# Patient Record
Sex: Male | Born: 1950 | Race: White | Hispanic: No | Marital: Married | Smoking: Former smoker
Health system: Southern US, Community
[De-identification: ages and names within clinical notes are randomized; demographics above are authoritative.]

## PROBLEM LIST (undated history)

## (undated) DIAGNOSIS — I10 Essential (primary) hypertension: Secondary | ICD-10-CM

## (undated) DIAGNOSIS — J8410 Pulmonary fibrosis, unspecified: Secondary | ICD-10-CM

## (undated) DIAGNOSIS — K219 Gastro-esophageal reflux disease without esophagitis: Secondary | ICD-10-CM

## (undated) DIAGNOSIS — I2510 Atherosclerotic heart disease of native coronary artery without angina pectoris: Secondary | ICD-10-CM

## (undated) DIAGNOSIS — G4700 Insomnia, unspecified: Secondary | ICD-10-CM

## (undated) DIAGNOSIS — J449 Chronic obstructive pulmonary disease, unspecified: Secondary | ICD-10-CM

## (undated) HISTORY — PX: CATARACT EXTRACTION: SUR2

## (undated) HISTORY — PX: HERNIA REPAIR: SHX51

---

## 1998-05-09 ENCOUNTER — Ambulatory Visit (HOSPITAL_BASED_OUTPATIENT_CLINIC_OR_DEPARTMENT_OTHER): Admission: RE | Admit: 1998-05-09 | Discharge: 1998-05-09 | Payer: Self-pay | Admitting: Otolaryngology

## 2017-05-16 ENCOUNTER — Emergency Department (HOSPITAL_COMMUNITY): Payer: Medicare PPO

## 2017-05-16 ENCOUNTER — Other Ambulatory Visit: Payer: Self-pay

## 2017-05-16 ENCOUNTER — Inpatient Hospital Stay (HOSPITAL_COMMUNITY)
Admission: EM | Admit: 2017-05-16 | Discharge: 2017-05-23 | DRG: 196 | Disposition: A | Discharge status: Home Health | Payer: Medicare PPO | Attending: Family Medicine | Admitting: Family Medicine

## 2017-05-16 ENCOUNTER — Encounter (HOSPITAL_COMMUNITY): Payer: Self-pay | Admitting: Emergency Medicine

## 2017-05-16 DIAGNOSIS — R0602 Shortness of breath: Secondary | ICD-10-CM

## 2017-05-16 DIAGNOSIS — J9601 Acute respiratory failure with hypoxia: Secondary | ICD-10-CM

## 2017-05-16 DIAGNOSIS — Z803 Family history of malignant neoplasm of breast: Secondary | ICD-10-CM

## 2017-05-16 DIAGNOSIS — J069 Acute upper respiratory infection, unspecified: Secondary | ICD-10-CM | POA: Diagnosis present

## 2017-05-16 DIAGNOSIS — Z9981 Dependence on supplemental oxygen: Secondary | ICD-10-CM

## 2017-05-16 DIAGNOSIS — K219 Gastro-esophageal reflux disease without esophagitis: Secondary | ICD-10-CM | POA: Diagnosis present

## 2017-05-16 DIAGNOSIS — Z8249 Family history of ischemic heart disease and other diseases of the circulatory system: Secondary | ICD-10-CM

## 2017-05-16 DIAGNOSIS — J84112 Idiopathic pulmonary fibrosis: Principal | ICD-10-CM | POA: Diagnosis present

## 2017-05-16 DIAGNOSIS — R791 Abnormal coagulation profile: Secondary | ICD-10-CM | POA: Diagnosis present

## 2017-05-16 DIAGNOSIS — R0902 Hypoxemia: Secondary | ICD-10-CM | POA: Diagnosis not present

## 2017-05-16 DIAGNOSIS — B974 Respiratory syncytial virus as the cause of diseases classified elsewhere: Secondary | ICD-10-CM | POA: Diagnosis present

## 2017-05-16 DIAGNOSIS — J9621 Acute and chronic respiratory failure with hypoxia: Secondary | ICD-10-CM | POA: Diagnosis present

## 2017-05-16 DIAGNOSIS — Z87891 Personal history of nicotine dependence: Secondary | ICD-10-CM | POA: Diagnosis not present

## 2017-05-16 DIAGNOSIS — J849 Interstitial pulmonary disease, unspecified: Secondary | ICD-10-CM | POA: Diagnosis not present

## 2017-05-16 DIAGNOSIS — Z880 Allergy status to penicillin: Secondary | ICD-10-CM | POA: Diagnosis not present

## 2017-05-16 DIAGNOSIS — R739 Hyperglycemia, unspecified: Secondary | ICD-10-CM | POA: Diagnosis present

## 2017-05-16 DIAGNOSIS — Y92009 Unspecified place in unspecified non-institutional (private) residence as the place of occurrence of the external cause: Secondary | ICD-10-CM | POA: Diagnosis not present

## 2017-05-16 DIAGNOSIS — J449 Chronic obstructive pulmonary disease, unspecified: Secondary | ICD-10-CM

## 2017-05-16 DIAGNOSIS — Z800 Family history of malignant neoplasm of digestive organs: Secondary | ICD-10-CM

## 2017-05-16 DIAGNOSIS — R062 Wheezing: Secondary | ICD-10-CM | POA: Diagnosis not present

## 2017-05-16 DIAGNOSIS — I10 Essential (primary) hypertension: Secondary | ICD-10-CM

## 2017-05-16 DIAGNOSIS — E440 Moderate protein-calorie malnutrition: Secondary | ICD-10-CM

## 2017-05-16 DIAGNOSIS — J441 Chronic obstructive pulmonary disease with (acute) exacerbation: Secondary | ICD-10-CM | POA: Diagnosis not present

## 2017-05-16 DIAGNOSIS — F5104 Psychophysiologic insomnia: Secondary | ICD-10-CM | POA: Diagnosis present

## 2017-05-16 DIAGNOSIS — T380X5A Adverse effect of glucocorticoids and synthetic analogues, initial encounter: Secondary | ICD-10-CM | POA: Diagnosis present

## 2017-05-16 DIAGNOSIS — G4733 Obstructive sleep apnea (adult) (pediatric): Secondary | ICD-10-CM | POA: Diagnosis present

## 2017-05-16 DIAGNOSIS — E876 Hypokalemia: Secondary | ICD-10-CM | POA: Diagnosis not present

## 2017-05-16 HISTORY — DX: Insomnia, unspecified: G47.00

## 2017-05-16 HISTORY — DX: Chronic obstructive pulmonary disease, unspecified: J44.9

## 2017-05-16 HISTORY — DX: Essential (primary) hypertension: I10

## 2017-05-16 HISTORY — DX: Gastro-esophageal reflux disease without esophagitis: K21.9

## 2017-05-16 HISTORY — DX: Pulmonary fibrosis, unspecified: J84.10

## 2017-05-16 LAB — RESPIRATORY PANEL BY PCR
Adenovirus: NOT DETECTED
BORDETELLA PERTUSSIS-RVPCR: NOT DETECTED
CORONAVIRUS 229E-RVPPCR: NOT DETECTED
CORONAVIRUS OC43-RVPPCR: NOT DETECTED
Chlamydophila pneumoniae: NOT DETECTED
Coronavirus HKU1: NOT DETECTED
Coronavirus NL63: NOT DETECTED
INFLUENZA A-RVPPCR: NOT DETECTED
INFLUENZA B-RVPPCR: NOT DETECTED
METAPNEUMOVIRUS-RVPPCR: NOT DETECTED
MYCOPLASMA PNEUMONIAE-RVPPCR: NOT DETECTED
PARAINFLUENZA VIRUS 1-RVPPCR: NOT DETECTED
Parainfluenza Virus 2: NOT DETECTED
Parainfluenza Virus 3: NOT DETECTED
Parainfluenza Virus 4: NOT DETECTED
RESPIRATORY SYNCYTIAL VIRUS-RVPPCR: NOT DETECTED
Rhinovirus / Enterovirus: NOT DETECTED

## 2017-05-16 LAB — COMPREHENSIVE METABOLIC PANEL
ALK PHOS: 69 U/L (ref 38–126)
ALT: 16 U/L — AB (ref 17–63)
ANION GAP: 13 (ref 5–15)
AST: 21 U/L (ref 15–41)
Albumin: 3 g/dL — ABNORMAL LOW (ref 3.5–5.0)
BILIRUBIN TOTAL: 0.9 mg/dL (ref 0.3–1.2)
BUN: 10 mg/dL (ref 6–20)
CALCIUM: 8.7 mg/dL — AB (ref 8.9–10.3)
CO2: 25 mmol/L (ref 22–32)
CREATININE: 0.86 mg/dL (ref 0.61–1.24)
Chloride: 102 mmol/L (ref 101–111)
Glucose, Bld: 168 mg/dL — ABNORMAL HIGH (ref 65–99)
Potassium: 3.9 mmol/L (ref 3.5–5.1)
SODIUM: 140 mmol/L (ref 135–145)
TOTAL PROTEIN: 6.2 g/dL — AB (ref 6.5–8.1)

## 2017-05-16 LAB — BRAIN NATRIURETIC PEPTIDE: B NATRIURETIC PEPTIDE 5: 150.3 pg/mL — AB (ref 0.0–100.0)

## 2017-05-16 LAB — I-STAT TROPONIN, ED
TROPONIN I, POC: 0.03 ng/mL (ref 0.00–0.08)
Troponin i, poc: 0.02 ng/mL (ref 0.00–0.08)

## 2017-05-16 LAB — CBC WITH DIFFERENTIAL/PLATELET
BASOS PCT: 0 %
Basophils Absolute: 0 10*3/uL (ref 0.0–0.1)
EOS PCT: 2 %
Eosinophils Absolute: 0.3 10*3/uL (ref 0.0–0.7)
HEMATOCRIT: 38.2 % — AB (ref 39.0–52.0)
HEMOGLOBIN: 12.8 g/dL — AB (ref 13.0–17.0)
Lymphocytes Relative: 8 %
Lymphs Abs: 1.2 10*3/uL (ref 0.7–4.0)
MCH: 29.2 pg (ref 26.0–34.0)
MCHC: 33.5 g/dL (ref 30.0–36.0)
MCV: 87 fL (ref 78.0–100.0)
MONO ABS: 0.9 10*3/uL (ref 0.1–1.0)
MONOS PCT: 6 %
NEUTROS PCT: 84 %
Neutro Abs: 12.3 10*3/uL — ABNORMAL HIGH (ref 1.7–7.7)
Platelets: 177 10*3/uL (ref 150–400)
RBC: 4.39 MIL/uL (ref 4.22–5.81)
RDW: 13.8 % (ref 11.5–15.5)
WBC: 14.7 10*3/uL — AB (ref 4.0–10.5)

## 2017-05-16 LAB — HEMOGLOBIN A1C
HEMOGLOBIN A1C: 5.7 % — AB (ref 4.8–5.6)
Mean Plasma Glucose: 116.89 mg/dL

## 2017-05-16 LAB — D-DIMER, QUANTITATIVE: D-Dimer, Quant: 1.36 ug/mL-FEU — ABNORMAL HIGH (ref 0.00–0.50)

## 2017-05-16 MED ORDER — ACETAMINOPHEN 325 MG PO TABS
650.0000 mg | ORAL_TABLET | Freq: Four times a day (QID) | ORAL | Status: DC | PRN
  Administered 2017-05-20: 650 mg via ORAL
  Filled 2017-05-16: qty 2

## 2017-05-16 MED ORDER — IOPAMIDOL (ISOVUE-370) INJECTION 76%
INTRAVENOUS | Status: AC
  Administered 2017-05-16: 100 mL
  Filled 2017-05-16: qty 100

## 2017-05-16 MED ORDER — ENOXAPARIN SODIUM 40 MG/0.4ML ~~LOC~~ SOLN
40.0000 mg | SUBCUTANEOUS | Status: DC
  Administered 2017-05-16 – 2017-05-22 (×7): 40 mg via SUBCUTANEOUS
  Filled 2017-05-16 (×7): qty 0.4

## 2017-05-16 MED ORDER — DEXTROSE 5 % IV SOLN
2.0000 g | Freq: Once | INTRAVENOUS | Status: AC
  Administered 2017-05-16: 2 g via INTRAVENOUS
  Filled 2017-05-16: qty 2

## 2017-05-16 MED ORDER — GUAIFENESIN ER 600 MG PO TB12
600.0000 mg | ORAL_TABLET | Freq: Two times a day (BID) | ORAL | Status: DC
  Administered 2017-05-16 – 2017-05-23 (×14): 600 mg via ORAL
  Filled 2017-05-16 (×14): qty 1

## 2017-05-16 MED ORDER — ALBUTEROL SULFATE (2.5 MG/3ML) 0.083% IN NEBU: 2.5000 mg | INHALATION_SOLUTION | RESPIRATORY_TRACT | Status: DC | PRN

## 2017-05-16 MED ORDER — IPRATROPIUM-ALBUTEROL 0.5-2.5 (3) MG/3ML IN SOLN
3.0000 mL | RESPIRATORY_TRACT | Status: DC | PRN
  Filled 2017-05-16: qty 3

## 2017-05-16 MED ORDER — POLYETHYLENE GLYCOL 3350 17 G PO PACK
17.0000 g | PACK | Freq: Every day | ORAL | Status: DC | PRN
  Filled 2017-05-16: qty 1

## 2017-05-16 MED ORDER — METHYLPREDNISOLONE SODIUM SUCC 125 MG IJ SOLR
125.0000 mg | INTRAMUSCULAR | Status: DC
  Administered 2017-05-16: 125 mg via INTRAVENOUS
  Filled 2017-05-16: qty 2

## 2017-05-16 MED ORDER — ONDANSETRON HCL 4 MG/2ML IJ SOLN: 4.0000 mg | Freq: Four times a day (QID) | INTRAMUSCULAR | Status: DC | PRN

## 2017-05-16 MED ORDER — ACETAMINOPHEN 650 MG RE SUPP: 650.0000 mg | Freq: Four times a day (QID) | RECTAL | Status: DC | PRN

## 2017-05-16 MED ORDER — SODIUM CHLORIDE 0.9 % IV SOLN
INTRAVENOUS | Status: DC
  Administered 2017-05-16 – 2017-05-17 (×2): via INTRAVENOUS

## 2017-05-16 MED ORDER — TIOTROPIUM BROMIDE MONOHYDRATE 18 MCG IN CAPS
18.0000 ug | ORAL_CAPSULE | Freq: Every day | RESPIRATORY_TRACT | Status: DC
  Administered 2017-05-18 – 2017-05-23 (×6): 18 ug via RESPIRATORY_TRACT
  Filled 2017-05-16 (×2): qty 5

## 2017-05-16 MED ORDER — VANCOMYCIN HCL 10 G IV SOLR
1500.0000 mg | Freq: Once | INTRAVENOUS | Status: AC
  Administered 2017-05-16: 1500 mg via INTRAVENOUS
  Filled 2017-05-16: qty 1500

## 2017-05-16 MED ORDER — VANCOMYCIN HCL IN DEXTROSE 1-5 GM/200ML-% IV SOLN
1000.0000 mg | Freq: Two times a day (BID) | INTRAVENOUS | Status: DC
  Administered 2017-05-17: 1000 mg via INTRAVENOUS
  Filled 2017-05-16 (×2): qty 200

## 2017-05-16 MED ORDER — CEFEPIME HCL 1 G IJ SOLR
1.0000 g | Freq: Three times a day (TID) | INTRAMUSCULAR | Status: DC
  Administered 2017-05-17: 1 g via INTRAVENOUS
  Filled 2017-05-16 (×2): qty 1

## 2017-05-16 MED ORDER — IBUPROFEN 200 MG PO TABS
600.0000 mg | ORAL_TABLET | Freq: Four times a day (QID) | ORAL | Status: DC | PRN
Start: 2017-05-16 — End: 2017-05-23

## 2017-05-16 MED ORDER — ONDANSETRON HCL 4 MG PO TABS: 4.0000 mg | ORAL_TABLET | Freq: Four times a day (QID) | ORAL | Status: DC | PRN

## 2017-05-16 NOTE — ED Notes (Signed)
ED Provider and RT at bedside. 

## 2017-05-16 NOTE — ED Triage Notes (Signed)
Pt arrives from home via [facility] reporting incr SOB x 1 week, c/o "minimal" CP x 3 days.  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 Pt reports 2 hospitalizations in Jan at the [facility].  EMS reports giving 1 NTG. Pt AOx4, mentating well, NAD noted at this time.

## 2017-05-16 NOTE — Progress Notes (Signed)
Pt remains on NIV at this time. Once attempted to wean fiO2 on the Bipap machine patient immediately began to desat, pt tolerating it well

## 2017-05-16 NOTE — ED Provider Notes (Signed)
[facility] EMERGENCY DEPARTMENT Provider Note   CSN: [id number] Arrival date & time: 05/16/17  1231     History   Chief Complaint Chief Complaint  Patient presents with  . Shortness of Breath    HPI Jose Flores is a 67 y.o. male.  The history is provided by the patient, the spouse and medical records.  Shortness of Breath  This is a recurrent problem. The average episode lasts 3 days. The problem occurs continuously.The current episode started more than 2 days ago. The problem has been rapidly worsening. Associated symptoms include rhinorrhea, cough, sputum production and chest pain. Pertinent negatives include no fever, no headaches, no sore throat, no hemoptysis, no wheezing, no syncope, no vomiting, no abdominal pain and no leg swelling. He has tried nothing for the symptoms. He has had prior hospitalizations. Associated medical issues include COPD and chronic lung disease.  Chest Pain   This is a recurrent problem. The current episode started more than 2 days ago. The problem occurs constantly. The problem has been gradually improving. The pain is associated with coughing. The pain is present in the substernal region. The pain is moderate. The quality of the pain is described as sharp. The pain does not radiate. Associated symptoms include cough, shortness of breath and sputum production. Pertinent negatives include no abdominal pain, no back pain, no diaphoresis, no fever, no headaches, no hemoptysis, no syncope and no vomiting.    No past medical history on file.  There are no active problems to display for this patient.   History reviewed. No pertinent surgical history.     Home Medications    Prior to Admission medications   Not on File    Family History No family history on file.  Social History Social History   Tobacco Use  . Smoking status: Not on file  Substance Use Topics  . Alcohol use: Not on file  . Drug use: Not on file      Allergies   Patient has no allergy information on record.   Review of Systems Review of Systems  Constitutional: Positive for chills. Negative for diaphoresis, fatigue and fever.  HENT: Positive for congestion and rhinorrhea. Negative for sore throat.   Eyes: Negative for visual disturbance.  Respiratory: Positive for cough, sputum production, chest tightness and shortness of breath. Negative for hemoptysis, choking and wheezing.   Cardiovascular: Positive for chest pain. Negative for leg swelling and syncope.  Gastrointestinal: Negative for abdominal pain and vomiting.  Genitourinary: Negative for dysuria.  Musculoskeletal: Negative for back pain.  Neurological: Negative for light-headedness and headaches.  Psychiatric/Behavioral: Negative for agitation.  All other systems reviewed and are negative.    Physical Exam Updated Vital Signs SpO2 (!) 85%   Physical Exam  Constitutional: He is oriented to person, place, and time. He appears well-developed and well-nourished.  Non-toxic appearance. He does not appear ill. He appears distressed.  HENT:  Head: Normocephalic.  Nose: Rhinorrhea present.  Mouth/Throat: Oropharynx is clear and moist. No oropharyngeal exudate.  Eyes: Conjunctivae and EOM are normal. Pupils are equal, round, and reactive to light.  Neck: Normal range of motion.  Cardiovascular: Regular rhythm and intact distal pulses. Tachycardia present.  No murmur heard. Pulmonary/Chest: Accessory muscle usage present. Tachypnea noted. He is in respiratory distress. He has no wheezes. He has rhonchi. He has rales. He exhibits no tenderness.  Abdominal: Soft. He exhibits no distension. There is no tenderness.  Musculoskeletal: He exhibits no edema or tenderness.  Lymphadenopathy:    He has no cervical adenopathy.  Neurological: He is alert and oriented to person, place, and time. No sensory deficit. He exhibits normal muscle tone.  Skin: Capillary refill takes less  than 2 seconds. He is not diaphoretic. No erythema. No pallor.  Psychiatric: He has a normal mood and affect.  Nursing note and vitals reviewed.    ED Treatments / Results  Labs (all labs ordered are listed, but only abnormal results are displayed) Labs Reviewed  CBC WITH DIFFERENTIAL/PLATELET - Abnormal; Notable for the following components:      Result Value   WBC 14.7 (*)    Hemoglobin 12.8 (*)    HCT 38.2 (*)    Neutro Abs 12.3 (*)    All other components within normal limits  COMPREHENSIVE METABOLIC PANEL - Abnormal; Notable for the following components:   Glucose, Bld 168 (*)    Calcium 8.7 (*)    Total Protein 6.2 (*)    Albumin 3.0 (*)    ALT 16 (*)    All other components within normal limits  BRAIN NATRIURETIC PEPTIDE - Abnormal; Notable for the following components:   B Natriuretic Peptide 150.3 (*)    All other components within normal limits  D-DIMER, QUANTITATIVE (NOT AT [facility]) - Abnormal; Notable for the following components:   D-Dimer, Quant 1.36 (*)    All other components within normal limits  RESPIRATORY PANEL BY PCR  CULTURE, BLOOD (ROUTINE X 2)  CULTURE, BLOOD (ROUTINE X 2)  CULTURE, EXPECTORATED SPUTUM-ASSESSMENT  I-STAT TROPONIN, ED  I-STAT TROPONIN, ED    EKG  EKG Interpretation  Date/Time:  Monday May 16 2017 12:41:16 EST Ventricular Rate:  131 PR Interval:    QRS Duration: 82 QT Interval:  287 QTC Calculation: 424 R Axis:   129 Text Interpretation:  Sinus tachycardia Right axis deviation Borderline T wave abnormalities No prior ECG for comparison.  No STEMI Confirmed by Theda Belfastegeler, Chris ([id number]) on 05/16/2017 12:44:12 PM       Radiology Ct Angio Chest Pe W And/or Wo Contrast  Result Date: 05/16/2017 CLINICAL DATA:  Shortness of breath x1 week, chest pain x3 days. Positive D-dimer. History of pulmonary fibrosis and COPD. EXAM: CT ANGIOGRAPHY CHEST WITH CONTRAST TECHNIQUE: Multidetector CT imaging of the chest was performed using the  standard protocol during bolus administration of intravenous contrast. Multiplanar CT image reconstructions and MIPs were obtained to evaluate the vascular anatomy. CONTRAST:  100mL ISOVUE-370 IOPAMIDOL (ISOVUE-370) INJECTION 76% COMPARISON:  Chest radiographs dated 05/16/2017. FINDINGS: Cardiovascular: Satisfactory opacification of the bilateral pulmonary arteries to the segmental level. Evaluation of the bilateral lower lobe pulmonary arteries is mildly constrained by respiratory motion. No evidence of pulmonary embolism. No evidence of thoracic aortic aneurysm or dissection. Heart is normal in size.  No pericardial effusion. Mild atherosclerotic calcifications of the aortic arch. Mediastinum/Nodes: Mild thoracic lymphadenopathy, likely reactive, including --16 mm short axis high right paratracheal node (series 13/image 21) --17 mm short axis AP window node (series 13/image 30) --19 mm short axis subcarinal node (series 13/image 40) Visualized thyroid is unremarkable. Lungs/Pleura: Evaluation of the lung parenchyma is constrained by respiratory motion. Within that constraint, there are no suspicious pulmonary nodules. Multifocal pulmonary opacities/fibrosis with associated traction bronchiectasis and possible mild honeycombing. This appearance favors a UIP pattern. Superimposed mild centrilobular and paraseptal emphysematous changes, upper lobe predominant. No focal consolidation. No pleural effusion or pneumothorax. Upper Abdomen: Visualized upper abdomen is grossly unremarkable. Musculoskeletal: Mild degenerative changes of the visualized thoracolumbar spine.  Review of the MIP images confirms the above findings. IMPRESSION: No evidence of pulmonary embolism. Chronic interstitial lung disease, compatible with the patient's clinical history of pulmonary fibrosis. Superimposed mild emphysematous changes. Mild thoracic lymphadenopathy, likely reactive. Aortic Atherosclerosis (ICD10-I70.0) and Emphysema  (ICD10-J43.9). Electronically Signed   By: Charline Bills M.D.   On: 05/16/2017 15:30   Dg Chest Portable 1 View  Result Date: 05/16/2017 CLINICAL DATA:  Shortness of breath. EXAM: PORTABLE CHEST 1 VIEW COMPARISON:  None. FINDINGS: The heart size and mediastinal contours are within normal limits. Diffusely increased interstitial markings in the left greater than right lungs. Small right pleural effusion versus chronic scarring at the right costophrenic angle. No pneumothorax. No focal superimposed consolidation. No acute osseous abnormality. IMPRESSION: 1. Chronic interstitial lung disease without definite superimposed acute process. Electronically Signed   By: Obie Dredge M.D.   On: 05/16/2017 13:04    Procedures Procedures (including critical care time)  CRITICAL CARE Performed by: Canary Brim Fidencio Duddy Total critical care time: 40 minutes Critical care time was exclusive of separately billable procedures and treating other patients. Hypoxia in the 60-70% range needing Bipap  Critical care was necessary to treat or prevent imminent or life-threatening deterioration. Critical care was time spent personally by me on the following activities: development of treatment plan with patient and/or surrogate as well as nursing, discussions with consultants, evaluation of patient's response to treatment, examination of patient, obtaining history from patient or surrogate, ordering and performing treatments and interventions, ordering and review of laboratory studies, ordering and review of radiographic studies, pulse oximetry and re-evaluation of patient's condition.   Medications Ordered in ED Medications  methylPREDNISolone sodium succinate (SOLU-MEDROL) 125 mg/2 mL injection 125 mg (125 mg Intravenous Given 05/16/17 1944)  vancomycin (VANCOCIN) 1,500 mg in sodium chloride 0.9 % 500 mL IVPB (not administered)  ceFEPIme (MAXIPIME) 1 g in dextrose 5 % 50 mL IVPB (not administered)  vancomycin  (VANCOCIN) IVPB 1000 mg/200 mL premix (not administered)  iopamidol (ISOVUE-370) 76 % injection (100 mLs  Contrast Given 05/16/17 1511)  ceFEPIme (MAXIPIME) 2 g in dextrose 5 % 50 mL IVPB (2 g Intravenous New Bag/Given 05/16/17 1945)     Initial Impression / Assessment and Plan / ED Course  I have reviewed the triage vital signs and the nursing notes.  Pertinent labs & imaging results that were available during my care of the patient were reviewed by me and considered in my medical decision making (see chart for details).     Jose Flores is a 67 y.o. male with a past medical history significant for pulmonary fibrosis and COPD on 6 L of home oxygen at baseline presents with shortness of breath, chest pain, and hypoxia.  Patient reports that for the last 3 days he has had intermittent chest pains.  He reports that he is no longer having chest pain but is feeling very short of breath.  On EMS arrival, patient was having saturations in the 60s and 70% range.  During transport with CPAP, patient remained in the 70% range.  On arrival, patient was switched to BiPAP and had his oxygen saturations improved into the 90s on 100% FiO2.  Patient is feeling somewhat better based on shortness of breath.  Patient denies recent leg pain or leg swelling.  He denies recent nausea, vomiting, diaphoresis, or abdominal pain.  He denies any recent trauma.  He denies recent fevers or chills.  On exam, patient's lungs are coarse but have crackles in the bases  bilaterally.  Patient's legs were nonedematous.  Patient was tachycardic in the 130s on arrival.  EKG showed a sinus rhythm.  Patient reports his previous care was at the [place].  He reports being admitted twice for hypoxia in the last month.  Based on patient's complaint I am concerned about COPD exacerbation versus pulmonary embolism versus heart failure.  Patient reports no history of heart failure however with the crackles I am concerned about this.    Patient  will have diagnostic laboratory testing as well as imaging to find the etiology of his symptoms.  Given patient's hypoxia on his home regimen, patient will need admission.    At this time, I do not feel patient needs intubation as his oxygen saturations have improved however he did express to me that he would want to be intubated if needed.  Anticipate reassessment after workup.   D-dimer was elevated.  Patient will have CT PE study to rule out PE.  Patient's white blood cell count was slightly elevated.  Patient reports that he was recently diagnosed with RSV several weeks ago with cold symptoms.  He is continued to have cold symptoms but he says it feels slightly different than the RSV.  Flu test will be added.  Chest x-ray returned showing chronic interstitial lung disease without evidence of acute pneumonia.  Anticipate following up on CT scan prior to admission however patient will need admission due to BiPAP dependence at this time.  CT scan showed no evidence of pulmonary embolism.  There was pulmonary fibrosis present but no evidence of pneumonia.  Due to patient's profound hypoxia compared to baseline and now dependence on BiPAP, patient will require admission.  I suspect this is due to a viral infection worsening his chronic lung disease.  We did not find evidence of pneumonia.  Admitting team will be called for further management.    Final Clinical Impressions(s) / ED Diagnoses   Final diagnoses:  SOB (shortness of breath)  Hypoxia   \  Clinical Impression: 1. SOB (shortness of breath)   2. Hypoxia     Disposition: Admit  This note was prepared with assistance of Dragon voice recognition software. Occasional wrong-word or sound-a-like substitutions may have occurred due to the inherent limitations of voice recognition software.      Shloima Clinch, Canary Brim, MD 05/16/17 2023

## 2017-05-16 NOTE — H&P (Signed)
Family Medicine Teaching [facility] Admission History and Physical Service Pager: [phone]  Patient name: Jose Flores Medical record number: [id number] Date of birth: December 01, 1950 Age: 67 y.o. Gender: male  Primary Care Provider: [facility] Consultants: Pulmonology Code Status: Full  Chief Complaint: Shortness of Breath  Assessment and Plan: Jose Flores is a 67 y.o. male presenting with SOB after a recent viral illness due to RSV. PMH is significant for HTN, pulmonary fibrosis, COPD, GERD  Acute on Chronic Hypoxic Respiratory Failure 2/2 to Interstitial Lung Disease w/Viral URI: Acute requiring BiPAP. Patient likely has hypoxia with respiratory failure due to chronic interstitial lung disease that he has had for "many years" and has been chronically worsening. At baseline on 6L O2. He recently had 2 hospitalizations in January at [facility] for testing positive for RSV and SOB with difficulty breathing. Most likely his chronic interstitial lung disease was acutely worsened by viral RSV and he did not have the capacity to cope with infection. RVP negative. Bacterial super infection is possible given meets sepsis criteria with 3/4 SIRS (HR 130, RR 26, WBC 14.7) but less likely as he is afebrile. qSOFA 1. Unlikely to be PNA as CXR and CT chest showed no focal consolidation but poor baseline lung status could be masking HAP. Patient had elevated d-dimer but CT Angio was negative for Pulmonary Embolus . - Admit to inpatient Stepdown, attending Dr. Jennette Kettle - continue BiPap, attempt to wean as respiratory function improves - vitals per floor with oxygen saturation - NPO while on BiPap - IV Solumedrol 125mg  daily - Cont home Spiriva daily - Vanc and Cefepime (2/4-)  - Start Guaifenesin  - Obtain BCx x 2 - Obtain Sputum culture  GERD: Chronic. Patient endorses taking a medication daily for gerd but does not have his medication list on him at the moment and cannot remember  what he takes. [facility]  HTN: Chronic. Patient endorses taking a medication for his blood pressure but has no medication list and does not know what he takes for sure. - monitor BP  [facility]  Insomnia: Chronic. Patient reports taking Trazadone for sleep but does not know does and does not have his med list. - obtain med list from [place] - Hold Trazadone for now until we know his normal nighttime dose  FEN/GI: NPO, sips with meds, advance once off BiPaP Prophylaxis: Lovenox  Disposition: Stepdown  History of Present Illness:  Jose Flores is a 67 y.o. male presenting with shortness of breath that started on Saturday. He is normally on 6L O2 at home and was recently hospitalized twice due to difficulty breathing and was found to have viral URI due to RSV. At home he turned up oxygen to 10L which only helped a little with continued difficulty breathing at rest. After no improvement with increased oxygen requirement he came to the [facility] ED today.  He had associated non-radiating, chest pain, central, intermittent, mild. None currently. Went away with couple of aleve.  He denies syncope, dizziness, headache, Coughing a little bit, but clear productive sputum.He has been taking cough medicine and endorses lots of nasal congestion without fever/chills.  Couldn't hear since last hospitalization 2 weeks.  Review Of Systems: Per HPI with the following additions:   Review of Systems  Constitutional: Positive for weight loss. Negative for chills and fever.  HENT: Positive for congestion, hearing loss and sinus pain. Negative for ear  discharge, ear pain and sore throat.   Respiratory: Positive for cough, shortness of breath and wheezing. Negative for hemoptysis.   Cardiovascular: Positive for chest pain. Negative for palpitations.  Gastrointestinal: Positive for heartburn. Negative for abdominal pain, constipation, diarrhea, nausea and vomiting.   Genitourinary: Negative for dysuria, frequency and urgency.  Neurological: Negative for dizziness.    Patient Active Problem List   Diagnosis Date Noted  . Hypoxia 05/16/2017    Past Medical History: Past Medical History:  Diagnosis Date  . Acid reflux   . COPD (chronic obstructive pulmonary disease) (HCC)   . Hypertension   . Insomnia   . Pulmonary fibrosis (HCC)     Past Surgical History: History reviewed. No pertinent surgical history.  Social History: Social History   Tobacco Use  . Smoking status: Former Games developermoker  . Smokeless tobacco: Never Used  Substance Use Topics  . Alcohol use: Yes    Comment: occ  . 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 Alcohol 3-4 drinks a night, last drink was way before Christmas. No recreational drugs.  Please also refer to relevant sections of EMR.  Family History: Family History  Problem Relation Age of Onset  . Colon cancer Mother   . Heart failure Father   . Breast cancer Sister      Allergies and Medications: Allergies  Allergen Reactions  . Penicillins     Unknown reaction, was hospitalized for this.   No current facility-administered medications on file prior to encounter.    Current Outpatient Medications on File Prior to Encounter  Medication Sig Dispense Refill  . tiotropium (SPIRIVA) 18 MCG inhalation capsule Place 18 mcg into inhaler and inhale daily.    Trazodone nightly Recently halved blood pressure medication because SBP WAS 110 as outpatient  Takes acid reflux Nebulizer at home Cough medicine chronically   Objective: BP 115/76   Pulse (!) 109   Temp 97.7 F (36.5 C) (Axillary)   Resp (!) 26   Ht 5\' 8"  (1.727 m)   Wt 166 lb (75.3 kg)   SpO2 97%   BMI 25.24 kg/m  Exam: General: Alert and Oriented x 3, NAD Eyes: PERRLA, EOMI ENTM: MMM. Dull TM on L, nonerythematous. TM on R pearly with good light reflex. Maxillary tenderness on  palpation. Cardiovascular: Tachycardia with regular rhythm, no murmurs, no gallops Respiratory: increased work of breathing with accessory muscle use, decreased breath sounds bilaterally, no wheezing, no crackles. Able to talk in short sentences, bipap in place. Gastrointestinal: non-distended, non-tender, soft. MSK: FROM of all extermities Derm: warm, dry, intact, no rashes Neuro: CN II-XII grossly intact, no gross deficits Psych: normal mood and affect  Labs and Imaging: CBC BMET  Recent Labs  Lab 05/16/17 1255  WBC 14.7*  HGB 12.8*  HCT 38.2*  PLT 177   Recent Labs  Lab 05/16/17 1255  NA 140  K 3.9  CL 102  CO2 25  BUN 10  CREATININE 0.86  GLUCOSE 168*  CALCIUM 8.7*     2/4 - D-dimer: 1.36 2/4 - BNP: 150.3 2/4 - Resp. Panel: not detected 2/4 - BCx: pending 2/4 - HgbA1c: pending 2/4 - Troponin I-stat x 2: 0.03 -> 0.02  Ct Angio Chest Pe W And/or Wo Contrast  Result Date: 05/16/2017 CLINICAL DATA:  Shortness of breath x1 week, chest pain x3 days. Positive D-dimer. History of pulmonary fibrosis and COPD. EXAM: CT ANGIOGRAPHY CHEST WITH CONTRAST TECHNIQUE: Multidetector  CT imaging of the chest was performed using the standard protocol during bolus administration of intravenous contrast. Multiplanar CT image reconstructions and MIPs were obtained to evaluate the vascular anatomy. CONTRAST:  ISOVUE-370 IOPAMIDOL (ISOVUE-370) INJECTION 76% COMPARISON:  Chest radiographs dated 05/16/2017. FINDINGS: Cardiovascular: Satisfactory opacification of the bilateral pulmonary arteries to the segmental level. Evaluation of the bilateral lower lobe pulmonary arteries is mildly constrained by respiratory motion. No evidence of pulmonary embolism. No evidence of thoracic aortic aneurysm or dissection. Heart is normal in size.  No pericardial effusion. Mild atherosclerotic calcifications of the aortic arch. Mediastinum/Nodes: Mild thoracic lymphadenopathy, likely reactive, including --16 mm  short axis high right paratracheal node (series 13/image 21) --17 mm short axis AP window node (series 13/image 30) --19 mm short axis subcarinal node (series 13/image 40) Visualized thyroid is unremarkable. Lungs/Pleura: Evaluation of the lung parenchyma is constrained by respiratory motion. Within that constraint, there are no suspicious pulmonary nodules. Multifocal pulmonary opacities/fibrosis with associated traction bronchiectasis and possible mild honeycombing. This appearance favors a UIP pattern. Superimposed mild centrilobular and paraseptal emphysematous changes, upper lobe predominant. No focal consolidation. No pleural effusion or pneumothorax. Upper Abdomen: Visualized upper abdomen is grossly unremarkable. Musculoskeletal: Mild degenerative changes of the visualized thoracolumbar spine. Review of the MIP images confirms the above findings. IMPRESSION: No evidence of pulmonary embolism. Chronic interstitial lung disease, compatible with the patient's clinical history of pulmonary fibrosis. Superimposed mild emphysematous changes. Mild thoracic lymphadenopathy, likely reactive. Aortic Atherosclerosis (ICD10-I70.0) and Emphysema (ICD10-J43.9). Electronically Signed   By: Charline Bills M.D.   On: 05/16/2017 15:30   Dg Chest Portable 1 View  Result Date: 05/16/2017 CLINICAL DATA:  Shortness of breath. EXAM: PORTABLE CHEST 1 VIEW COMPARISON:  None. FINDINGS: The heart size and mediastinal contours are within normal limits. Diffusely increased interstitial markings in the left greater than right lungs. Small right pleural effusion versus chronic scarring at the right costophrenic angle. No pneumothorax. No focal superimposed consolidation. No acute osseous abnormality. IMPRESSION: 1. Chronic interstitial lung disease without definite superimposed acute process. Electronically Signed   By: Obie Dredge M.D.   On: 05/16/2017 13:04    Arlyce Harman, DO 05/16/2017, 4:27 PM PGY-1, [place]  Family Medicine FPTS Intern pager: [phone], text pages welcome  FPTS Upper-Level Resident Addendum  I have independently interviewed and examined the patient. I have discussed the above with the original author and agree with their documentation. My edits for correction/addition/clarification are in blue. Please see also any attending notes.   Leland Her, DO PGY-2, [facility] 05/16/2017 8:21 PM  FPTS Service pager: [phone] (text pages welcome through [facility])

## 2017-05-16 NOTE — Progress Notes (Signed)
FPTS Interim Progress Note  Evaluated patient who is resting comfortably on BiPAP with sats 97% with good waveform.  BP 120/84   Pulse 99   Temp 97.7 F (36.5 C) (Axillary)   Resp (!) 26   Ht 5\' 8"  (1.727 m)   Wt 166 lb (75.3 kg)   SpO2 97%   BMI 25.24 kg/m    A/P: Continue current management.  Ellwood DenseRumball, Spiro Ausborn, DO 05/16/2017, 10:40 PM PGY-1, [facility] Service pager [phone]

## 2017-05-16 NOTE — Progress Notes (Signed)
Pharmacy Antibiotic Note  Jose Flores is a 67 y.o. male admitted on 05/16/2017 with sepsis.  Pharmacy has been consulted for cefepime and vancomycin dosing.  SCr stable, CrCl ~5680ml/min  Plan: Give cefepime 2g IV x 1, then start cefepime 1g IV Q8h Give vancomycin 1.5g IV x 1, then start vancomycin 1g IV Q12h Monitor clinical picture, renal function, VT prn F/U C&S, abx deescalation / LOT   Height: 5\' 8"  (172.7 cm) Weight: 166 lb (75.3 kg) IBW/kg (Calculated) : 68.4  Temp (24hrs), Avg:97.7 F (36.5 C), Min:97.7 F (36.5 C), Max:97.7 F (36.5 C)  Recent Labs  Lab 05/16/17 1255  WBC 14.7*  CREATININE 0.86    Estimated Creatinine Clearance: 81.7 mL/min (by C-G formula based on SCr of 0.86 mg/dL).    Allergies  Allergen Reactions  . Penicillins     Unknown reaction, was hospitalized for this.     Thank you for allowing pharmacy to be a part of this patient's care.  Armandina StammerBATCHELDER,Sharilynn Cassity J 05/16/2017 6:26 PM

## 2017-05-17 DIAGNOSIS — J449 Chronic obstructive pulmonary disease, unspecified: Secondary | ICD-10-CM

## 2017-05-17 DIAGNOSIS — J849 Interstitial pulmonary disease, unspecified: Secondary | ICD-10-CM

## 2017-05-17 DIAGNOSIS — R062 Wheezing: Secondary | ICD-10-CM

## 2017-05-17 DIAGNOSIS — J9601 Acute respiratory failure with hypoxia: Secondary | ICD-10-CM

## 2017-05-17 DIAGNOSIS — R0902 Hypoxemia: Secondary | ICD-10-CM

## 2017-05-17 LAB — BASIC METABOLIC PANEL
Anion gap: 10 (ref 5–15)
BUN: 17 mg/dL (ref 6–20)
CALCIUM: 8.8 mg/dL — AB (ref 8.9–10.3)
CO2: 24 mmol/L (ref 22–32)
Chloride: 106 mmol/L (ref 101–111)
Creatinine, Ser: 0.85 mg/dL (ref 0.61–1.24)
GFR calc Af Amer: >60 mL/min (ref >60)
GFR calc non Af Amer: >60 mL/min (ref >60)
GLUCOSE: 169 mg/dL — AB (ref 65–99)
Potassium: 4.7 mmol/L (ref 3.5–5.1)
Sodium: 140 mmol/L (ref 135–145)

## 2017-05-17 LAB — MRSA PCR SCREENING: MRSA BY PCR: NEGATIVE

## 2017-05-17 LAB — CBC
HCT: 35.6 % — ABNORMAL LOW (ref 39.0–52.0)
Hemoglobin: 11.5 g/dL — ABNORMAL LOW (ref 13.0–17.0)
MCH: 28.5 pg (ref 26.0–34.0)
MCHC: 32.3 g/dL (ref 30.0–36.0)
MCV: 88.3 fL (ref 78.0–100.0)
PLATELETS: 158 10*3/uL (ref 150–400)
RBC: 4.03 MIL/uL — ABNORMAL LOW (ref 4.22–5.81)
RDW: 14 % (ref 11.5–15.5)
WBC: 8.5 10*3/uL (ref 4.0–10.5)

## 2017-05-17 LAB — PROCALCITONIN: PROCALCITONIN: 0.16 ng/mL

## 2017-05-17 MED ORDER — POLYVINYL ALCOHOL 1.4 % OP SOLN
1.0000 [drp] | OPHTHALMIC | Status: DC | PRN
  Administered 2017-05-17: 1 [drp] via OPHTHALMIC
  Filled 2017-05-17: qty 15

## 2017-05-17 MED ORDER — ENSURE ENLIVE PO LIQD
237.0000 mL | Freq: Three times a day (TID) | ORAL | Status: DC
  Administered 2017-05-17 – 2017-05-23 (×17): 237 mL via ORAL

## 2017-05-17 MED ORDER — DOXYCYCLINE HYCLATE 100 MG PO TABS
100.0000 mg | ORAL_TABLET | Freq: Two times a day (BID) | ORAL | Status: DC
  Administered 2017-05-17 – 2017-05-18 (×3): 100 mg via ORAL
  Filled 2017-05-17 (×3): qty 1

## 2017-05-17 MED ORDER — PREDNISONE 50 MG PO TABS
50.0000 mg | ORAL_TABLET | Freq: Every day | ORAL | Status: DC
  Administered 2017-05-17 – 2017-05-18 (×2): 50 mg via ORAL
  Filled 2017-05-17 (×2): qty 1

## 2017-05-17 MED ORDER — ORAL CARE MOUTH RINSE
15.0000 mL | Freq: Two times a day (BID) | OROMUCOSAL | Status: DC
  Administered 2017-05-18: 15 mL via OROMUCOSAL

## 2017-05-17 MED ORDER — CHLORHEXIDINE GLUCONATE 0.12 % MT SOLN
15.0000 mL | Freq: Two times a day (BID) | OROMUCOSAL | Status: DC
  Administered 2017-05-17 – 2017-05-23 (×13): 15 mL via OROMUCOSAL
  Filled 2017-05-17 (×12): qty 15

## 2017-05-17 MED ORDER — PREDNISONE 10 MG PO TABS: 10.0000 mg | ORAL_TABLET | Freq: Every day | ORAL | Status: DC

## 2017-05-17 MED ORDER — IPRATROPIUM-ALBUTEROL 0.5-2.5 (3) MG/3ML IN SOLN
3.0000 mL | Freq: Two times a day (BID) | RESPIRATORY_TRACT | Status: DC
  Administered 2017-05-17 – 2017-05-23 (×11): 3 mL via RESPIRATORY_TRACT
  Filled 2017-05-17 (×12): qty 3

## 2017-05-17 MED ORDER — ORAL CARE MOUTH RINSE
15.0000 mL | Freq: Two times a day (BID) | OROMUCOSAL | Status: DC
  Administered 2017-05-17 – 2017-05-19 (×5): 15 mL via OROMUCOSAL

## 2017-05-17 NOTE — Progress Notes (Signed)
Visited with this patient and his spouse regarding the Advanced Directive.  Patient states that they have [facility]  Asked patient to please get a copy of it and bring it here so we can add it to his chart and medical record.  Patient's spouse stated she would get it and get it to us.      05/17/17 1032  Clinical Encounter Type  Visited With Patient and family together  Visit Type Initial;Spiritual support

## 2017-05-17 NOTE — Consult Note (Signed)
Name: Jose Flores MRN: [id number] DOB: May 13, 1950    ADMISSION DATE:  05/16/2017 CONSULTATION DATE:  05/17/2017  REFERRING MD :  [facility]  CHIEF COMPLAINT:  COPD/Pulmonary Fibrosis/ BIPAP  BRIEF PATIENT DESCRIPTION:  Adult male supine in bed wearing venti mask, speaking in full sentences with saturation of 100%. Some respiratory accessory muscle use noted. Alert and appropriate. Able to give good history.  SIGNIFICANT EVENTS  04/2017>> 2 admissions [facility] with Acute on chronic respiratory failure  + RSV 2/4>> Admission with A on C Respiratory Failure, RSV negative  STUDIES:  CTA 2/4>>: No evidence of pulmonary embolism. Chronic interstitial lung disease, compatible with the patient's clinical history of pulmonary fibrosis. Superimposed mild emphysematous changes. Mild thoracic lymphadenopathy, likely reactive.  CXR 2/4>> Chronic interstitial lung disease without definite superimposed acute process.  HISTORY OF PRESENT ILLNESS:   Jose Flores is a 67 y.o. male presenting with shortness of breath that started on 2/2. He is normally on 6L O2  at home  due to chronic ILD and was recently hospitalized twice 04/2017  due to difficulty breathing and was found to have viral URI due to RSV. At home he turned up oxygen to 10L which only helped a little with continued difficulty breathing at rest. After no improvement with increased oxygen requirement he came to the [facility] ED 2/2. Pt had 3/4 SIRS criteria, but no fever.CT chest showed no focal consolidation but poor baseline lung status could be masking HAP. Patient had elevated d-dimer but CT Angio was negative for PE. BNP was 150.3  PCCM has been asked to consult and assist with care.  Pt. States home oxygen demand prior to [place] admissions in 04/2017 was 3 L at rest and 8 L with exertion. Since discharge from [place]  baseline has been 6 L at rest and max with exertion  Of note, patient acutely lost his hearing  after recent admission to the [place] in January 2019   PAST MEDICAL HISTORY :   has a past medical history of Acid reflux, COPD (chronic obstructive pulmonary disease) (HCC), Hypertension, Insomnia, and Pulmonary fibrosis (HCC).  has no past surgical history on file. Prior to Admission medications   Medication Sig Start Date End Date Taking? Authorizing Provider  tiotropium (SPIRIVA) 18 MCG inhalation capsule Place 18 mcg into inhaler and inhale daily.   Yes [provider]   Allergies  Allergen Reactions  . Penicillins     Unknown reaction, was hospitalized for this.    FAMILY HISTORY:  family history includes Breast cancer in his sister; Colon cancer in his mother; Heart failure in his father. SOCIAL HISTORY:  reports that he has quit smoking. he has never used smokeless tobacco. He reports that he drinks alcohol. He reports that he does not use drugs.  REVIEW OF SYSTEMS:   Constitutional: Negative for fever, chills, weight loss, +malaise/fatigue and no diaphoresis.  HENT: +  for hearing loss, ear pain, nosebleeds, congestion, sore throat, neck pain, tinnitus and ear discharge.   Eyes: Negative for blurred vision, double vision, photophobia, pain, discharge and redness.  Respiratory: + for intermittent  Cough, No  hemoptysis, + yellow sputum production, + shortness of breath, No wheezing or stridor.   Cardiovascular: + for chest pain relieved with Aleve, palpitations, No orthopnea, claudication, + leg swelling and PND.  Gastrointestinal: Negative for heartburn, nausea, vomiting, abdominal pain, diarrhea, constipation, blood in stool and melena.  Genitourinary: Negative for dysuria, urgency, frequency, hematuria and  flank pain.  Musculoskeletal: Negative for myalgias, back pain, joint pain and falls.  Skin: Negative for itching and rash.  Neurological: Negative for dizziness, tingling, tremors, sensory change, speech change, focal weakness, seizures, loss of consciousness,  weakness and headaches.  Endo/Heme/Allergies: Negative for environmental allergies and polydipsia. Does not bruise/bleed easily.  SUBJECTIVE: Awake and alert supine in bed wearing  Venti Mask. States he feels better than yesterday.  VITAL SIGNS: Temp:  [97.6 F (36.4 C)-98.3 F (36.8 C)] 97.6 F (36.4 C) (02/05 0401) Pulse Rate:  [90-132] 93 (02/05 0716) Resp:  [18-30] 23 (02/05 0716) BP: (115-147)/(75-91) 134/81 (02/05 0716) SpO2:  [75 %-100 %] 100 % (02/05 0716) FiO2 (%):  [50 %-100 %] 50 % (02/05 0816) Weight:  [166 lb (75.3 kg)] 166 lb (75.3 kg) (02/04 1245)  PHYSICAL EXAMINATION: General: Awake alert and appropriate  Neuro:  MAE x 4, A&O x 3, Appropriate, conversational HEENT:  NCAT, no JVD, + congestion, +  hearing loss, + sinus tenderness Cardiovascular: , S1, S2, No RMG, SR per telemetry Lungs: Crackles throughout, no wheezing,  + accessory muscle use, BS decreased per bases  Abdomen: BS +, non-distended, non-tender, obese Musculoskeletal: FROM al extremities Skin: Warm, dry, intact , no rash or lesions noted  Recent Labs  Lab 05/16/17 1255 05/17/17 0328  NA 140 140  K 3.9 4.7  CL 102 106  CO2 25 24  BUN 10 17  CREATININE 0.86 0.85  GLUCOSE 168* 169*   Recent Labs  Lab 05/16/17 1255 05/17/17 0328  HGB 12.8* 11.5*  HCT 38.2* 35.6*  WBC 14.7* 8.5  PLT 177 158   Ct Angio Chest Pe W And/or Wo Contrast  Result Date: 05/16/2017 CLINICAL DATA:  Shortness of breath x1 week, chest pain x3 days. Positive D-dimer. History of pulmonary fibrosis and COPD. EXAM: CT ANGIOGRAPHY CHEST WITH CONTRAST TECHNIQUE: Multidetector CT imaging of the chest was performed using the standard protocol during bolus administration of intravenous contrast. Multiplanar CT image reconstructions and MIPs were obtained to evaluate the vascular anatomy. CONTRAST:  ISOVUE-370 IOPAMIDOL (ISOVUE-370) INJECTION 76% COMPARISON:  Chest radiographs dated 05/16/2017. FINDINGS: Cardiovascular:  Satisfactory opacification of the bilateral pulmonary arteries to the segmental level. Evaluation of the bilateral lower lobe pulmonary arteries is mildly constrained by respiratory motion. No evidence of pulmonary embolism. No evidence of thoracic aortic aneurysm or dissection. Heart is normal in size.  No pericardial effusion. Mild atherosclerotic calcifications of the aortic arch. Mediastinum/Nodes: Mild thoracic lymphadenopathy, likely reactive, including --16 mm short axis high right paratracheal node (series 13/image 21) --17 mm short axis AP window node (series 13/image 30) --19 mm short axis subcarinal node (series 13/image 40) Visualized thyroid is unremarkable. Lungs/Pleura: Evaluation of the lung parenchyma is constrained by respiratory motion. Within that constraint, there are no suspicious pulmonary nodules. Multifocal pulmonary opacities/fibrosis with associated traction bronchiectasis and possible mild honeycombing. This appearance favors a UIP pattern. Superimposed mild centrilobular and paraseptal emphysematous changes, upper lobe predominant. No focal consolidation. No pleural effusion or pneumothorax. Upper Abdomen: Visualized upper abdomen is grossly unremarkable. Musculoskeletal: Mild degenerative changes of the visualized thoracolumbar spine. Review of the MIP images confirms the above findings. IMPRESSION: No evidence of pulmonary embolism. Chronic interstitial lung disease, compatible with the patient's clinical history of pulmonary fibrosis. Superimposed mild emphysematous changes. Mild thoracic lymphadenopathy, likely reactive. Aortic Atherosclerosis (ICD10-I70.0) and Emphysema (ICD10-J43.9). Electronically Signed   By: Charline Bills M.D.   On: 05/16/2017 15:30   Dg Chest Portable 1 View  Result  Date: 05/16/2017 CLINICAL DATA:  Shortness of breath. EXAM: PORTABLE CHEST 1 VIEW COMPARISON:  None. FINDINGS: The heart size and mediastinal contours are within normal limits. Diffusely  increased interstitial markings in the left greater than right lungs. Small right pleural effusion versus chronic scarring at the right costophrenic angle. No pneumothorax. No focal superimposed consolidation. No acute osseous abnormality. IMPRESSION: 1. Chronic interstitial lung disease without definite superimposed acute process. Electronically Signed   By: Obie DredgeWilliam T Derry M.D.   On: 05/16/2017 13:04    Antibiotics:  Vanc 2/4>> Cefepime 2/4>>  Cultures Blood 2/4 RVP>> Negative  ASSESSMENT / PLAN: A: - Acute on Chronic Respiratory Failure 2/2 ILD with recent Viral URI ( RSV +)  January 2019  [place]qu[place]  [place] - RSV negative 2/4 - COPD>> Home maintenance Spiriva and Symbicort, Albuterol nebs - Baseline oxygen demand 6L [place] - D-Dimer elevated but CTA negative for PE - Requiring BiPAP on admission - ? Bacterial Super infection as 3/4 SIRS Criteria ( HR 130, RR 26, WBC 14.7), but   Afebrile - BNP 150.3 - Possible ILD Flare  - Acute on Chronic Respiratory Failure 2/2 ILD with recent Viral URI ( RSV +)  January 2019  [place]qu[place]  [place] - RSV negative 2/4 ? ILD  Flare>> no ILD maintenance to slow progression  P: Continue BiPAP as needed with goal of weaning as improves Wean oxygen to baseline home needs  as able with saturation goals of 88-92%  Consider Solu Medrol 125 mg  increase to BID  x 48 hours if worsening hypoxia, then titrate down >> discharge home on slow pred taper to cover for possible ILD flare. Monitor blood sugars, CBG Q 4, add SSI if necessary Continue Mucinex BID, with full glass of water Continue Spiriva 18 mcg daily Rinse mouth after use Duonebs add scheduled dosing BID in addition to prn Albuterol Nebs prn as ordered Mobilize as able  COPD P: Continue Spiriva 18 mcg daily Rinse mouth after use Duonebs  BID and prn Albuterol Nebs prn as ordered Wean oxygen to Sat goal is 88-92%>> goal  is home baseline [facility] ( Has appointment with Dr. Shelle Ironlance in [place] 05/2017)   ? Bacterial Super infection as 3/4 SIRS Criteria ( HR 130, RR 26, WBC 14.7) on admission , but   Afebrile and WBC down trending 2/5  P: Continue Vanc and Maxipine  Sputum Culture>> Follow Micro de-escalate as able Trend CBC and Fever Curve Trend CXR prn  Consider trending  Procalcitonin    Bevelyn NgoSarah F. Dareion Kneece, AGACNP-BC Pulmonary and Critical Care Medicine [facility] Pager: [phone]  05/17/2017, 9:56 AM

## 2017-05-17 NOTE — Progress Notes (Signed)
Family Medicine Teaching Service Daily Progress Note Intern Pager: [phone]  Patient name: Jose Flores Medical record number: [id number] Date of birth: 08-Oct-1950 Age: 67 y.o. Gender: male  Primary Care Provider: [facility] Consultants: Pulmonology Code Status: Full  Pt Overview and Major Events to Date:  Jose Flores is a 67 y.o. male presenting with SOB after a recent viral illness due to RSV. PMH is significant for HTN, pulmonary fibrosis, COPD, GERD  Assessment and Plan:  Acute on Chronic Hypoxic Respiratory Failure 2/2 to Interstitial Lung Disease w/Viral URI:  Remains on BiPAP with saturations at 98% FiO2 50%.  Patient likely has hypoxia with respiratory failure due to chronic interstitial lung disease that he has had for "many years" and has been chronically worsening. At baseline on 6L O2.   History significant for recent hospitalizations x2 with RSV testing positive.  RVP negative, does not rule out alternative viral infection. Hemodynamically stable feels like his breathing is better.  Patient likely has deconditioning from recent hospitalizations worsening of chronic lung disease and possible remnants of viral illness leading to respiratory distress. Unlikely to be PNA as CXR and CT chest showed no focal consolidation but poor baseline lung status could be masking HAP.   Continue broad-spectrum antibiotics until blood or sputum cultures negative 48 hours. Patient had elevated d-dimer but CT Angio was negative for Pulmonary Embolus . - continue BiPap, attempt to wean as respiratory function improves - vitals per floor with oxygen saturation - NPO while on BiPap - IV Solumedrol 125mg  daily - Cont home Spiriva daily -Day 1 of antibiotics      -Vanc and Cefepime (2/4-)  - Guaifenesin  - BCx x 2 pending - Pending sputum culture  -Duo nebs as needed -Pro-calcitonin pending -Pulmonary consulted, appreciate recommendations  GERD: Chronic. Patient endorses  taking a medication daily for gerd but does not have his medication list on him at the moment and cannot remember what he takes. [facility]  HTN: Chronic.  Normotensive.  Patient endorses taking a medication for his blood pressure but has no medication list and does not know what he takes for sure. - monitor BP  [facility]  Insomnia: Chronic. Patient reports taking Trazadone for sleep but does not know does and does not have his med list. - obtain med list from [place] - Hold Trazadone for now until we know his normal nighttime dose  FEN/GI: NPO, sips with meds, advance once off BiPaP Prophylaxis: Lovenox  Disposition: Stepdown, requiring BiPAP  Subjective:  Feels like he is feeling all better.  Does not complain of any trouble breathing or chest pain.  Objective: Temp:  [97.6 F (36.4 C)-98.3 F (36.8 C)] 97.6 F (36.4 C) (02/05 0401) Pulse Rate:  [90-132] 90 (02/05 0401) Resp:  [18-30] 24 (02/05 0401) BP: (115-147)/(75-91) 126/91 (02/05 0401) SpO2:  [75 %-100 %] 98 % (02/05 0610) FiO2 (%):  [50 %-100 %] 50 % (02/05 0610) Weight:  [166 lb (75.3 kg)] 166 lb (75.3 kg) (02/04 1245) Physical Exam: General: Lying in bed with BiPAP mask, no acute distress Cardiovascular: Regular rate and rhythm, no murmurs or gallops Respiratory: BiPAP in place, normal work of breathing, lungs clear to auscultation bilaterally Abdomen: Soft nondistended nontender Extremities: No lower extremity edema 2+ tablets pedis  Laboratory: Recent Labs  Lab 05/16/17 1255 05/17/17 0328  WBC 14.7* 8.5  HGB 12.8* 11.5*  HCT 38.2* 35.6*  PLT 177 158  Recent Labs  Lab 05/16/17 1255 05/17/17 0328  NA 140 140  K 3.9 4.7  CL 102 106  CO2 25 24  BUN 10 17  CREATININE 0.86 0.85  CALCIUM 8.7* 8.8*  PROT 6.2*  --   BILITOT 0.9  --   ALKPHOS 69  --   ALT 16*  --   AST 21  --   GLUCOSE 168* 169*    [name], [name], MD 05/17/2017, 7:07 AM PGY-1, [facility]  Family Medicine FPTS Intern pager: [phone], text pages welcome

## 2017-05-17 NOTE — Progress Notes (Signed)
Initial Nutrition Assessment  DOCUMENTATION CODES:   Not applicable  INTERVENTION:   -Ensure Enlive po TID, each supplement provides 350 kcal and 20 grams of protein  NUTRITION DIAGNOSIS:   Increased nutrient needs related to chronic illness(ILD) as evidenced by estimated needs.  GOAL:   Patient will meet greater than or equal to 90% of their needs  MONITOR:   PO intake, Supplement acceptance, Labs, Weight trends, Skin, I & O's  REASON FOR ASSESSMENT:   Malnutrition Screening Tool    ASSESSMENT:   Jose Flores is a 67 y.o. male presenting with SOB after a recent viral illness due to RSV. PMH is significant for HTN, pulmonary fibrosis, COPD, GERD  Pt admitted with respiratory failure due to interstitial lung disease with viral URI.   Unable to speak with pt at this time or obtain nutriton-focused physical exam as pt  In with MD at times of visits.   Noted pt transitioned from Bi-pap to non-rebreather mask.   No wt hx available to assess. Per MST report, pt reports significant unintentional wt loss ithe past year.   Labs reviewed.   Diet Order:  Diet Heart Room service appropriate? Yes; Fluid consistency: Thin  EDUCATION NEEDS:   No education needs have been identified at this time  Skin:  Skin Assessment: Reviewed RN Assessment  Last BM:  05/16/17  Height:   Ht Readings from Last 1 Encounters:  05/16/17 5\' 8"  (1.727 m)    Weight:   Wt Readings from Last 1 Encounters:  05/16/17 166 lb (75.3 kg)    Ideal Body Weight:  70 kg  BMI:  Body mass index is 25.24 kg/m.  Estimated Nutritional Needs:   Kcal:  2250-2450  Protein:  115-130 grams  Fluid:  2.2-2.4 L    Raymund Manrique A. Mayford KnifeWilliams, RD, LDN, CDE Pager: [phone] After hours Pager: [phone]

## 2017-05-17 NOTE — Progress Notes (Signed)
RT placed patient on 10lpm HFNC. Patient's 02 saturations of 96%. Will titrate flow down as needed. RT will continue to monitor.

## 2017-05-17 NOTE — Progress Notes (Signed)
RT changed patient from NRB to 55%VM. RT will attempt to wean to HFNC later in day. RT will continue to monitor.

## 2017-05-18 DIAGNOSIS — R0602 Shortness of breath: Secondary | ICD-10-CM

## 2017-05-18 DIAGNOSIS — J441 Chronic obstructive pulmonary disease with (acute) exacerbation: Secondary | ICD-10-CM

## 2017-05-18 LAB — CBC
HEMATOCRIT: 32.2 % — AB (ref 39.0–52.0)
Hemoglobin: 10.6 g/dL — ABNORMAL LOW (ref 13.0–17.0)
MCH: 28.6 pg (ref 26.0–34.0)
MCHC: 32.9 g/dL (ref 30.0–36.0)
MCV: 86.8 fL (ref 78.0–100.0)
PLATELETS: 194 10*3/uL (ref 150–400)
RBC: 3.71 MIL/uL — AB (ref 4.22–5.81)
RDW: 13.4 % (ref 11.5–15.5)
WBC: 14.6 10*3/uL — AB (ref 4.0–10.5)

## 2017-05-18 LAB — PHOSPHORUS: Phosphorus: 2.3 mg/dL — ABNORMAL LOW (ref 2.5–4.6)

## 2017-05-18 LAB — MAGNESIUM: Magnesium: 2.1 mg/dL (ref 1.7–2.4)

## 2017-05-18 LAB — BASIC METABOLIC PANEL
Anion gap: 10 (ref 5–15)
BUN: 21 mg/dL — ABNORMAL HIGH (ref 6–20)
CALCIUM: 8.8 mg/dL — AB (ref 8.9–10.3)
CO2: 25 mmol/L (ref 22–32)
Chloride: 107 mmol/L (ref 101–111)
Creatinine, Ser: 0.88 mg/dL (ref 0.61–1.24)
Glucose, Bld: 141 mg/dL — ABNORMAL HIGH (ref 65–99)
Potassium: 4.5 mmol/L (ref 3.5–5.1)
Sodium: 142 mmol/L (ref 135–145)

## 2017-05-18 LAB — PROCALCITONIN: PROCALCITONIN: 0.17 ng/mL

## 2017-05-18 MED ORDER — DEXTROSE 5 % IV SOLN
1.0000 g | Freq: Three times a day (TID) | INTRAVENOUS | Status: AC
  Administered 2017-05-18 – 2017-05-20 (×8): 1 g via INTRAVENOUS
  Filled 2017-05-18 (×8): qty 1

## 2017-05-18 MED ORDER — METHYLPREDNISOLONE SODIUM SUCC 125 MG IJ SOLR
125.0000 mg | Freq: Four times a day (QID) | INTRAMUSCULAR | Status: AC
  Administered 2017-05-18 (×2): 125 mg via INTRAVENOUS
  Filled 2017-05-18 (×2): qty 2

## 2017-05-18 MED ORDER — VANCOMYCIN HCL IN DEXTROSE 1-5 GM/200ML-% IV SOLN
1000.0000 mg | Freq: Two times a day (BID) | INTRAVENOUS | Status: AC
  Administered 2017-05-18 – 2017-05-20 (×5): 1000 mg via INTRAVENOUS
  Filled 2017-05-18 (×5): qty 200

## 2017-05-18 NOTE — Progress Notes (Signed)
PT Cancellation Note  Patient Details Name: Jose LimaRoger D Kropp MRN: [id number] DOB: 01-07-1951   Cancelled Treatment:    Reason Eval/Treat Not Completed: Patient not medically ready. Pt still with increased respiratory requirements; RN requesting PT follow-up tomorrow for evaluation.  [name], PT, DPT Acute Rehab Services  Pager: [phone]  [name] 05/18/2017, 2:12 PM

## 2017-05-18 NOTE — Progress Notes (Signed)
[facility].  [place].

## 2017-05-18 NOTE — Consult Note (Addendum)
Name: Jose Flores MRN: [id number] DOB: [birth year]    ADMISSION DATE:  05/16/2017 CONSULTATION DATE:  05/17/2017  REFERRING MD :  [facility]  CHIEF COMPLAINT:  COPD/Pulmonary Fibrosis/ BIPAP  BRIEF PATIENT DESCRIPTION:  Adult male supine in bed wearing venti mask, speaking in full sentences with saturation of 100%. Some respiratory accessory muscle use noted. Alert and appropriate. Able to give good history.  SIGNIFICANT EVENTS  04/2017>> 2 admissions [facility] with Acute on chronic respiratory failure  + RSV 2/4>> Admission with A on C Respiratory Failure, RSV negative  STUDIES:  CTA 2/4>>: No evidence of pulmonary embolism. Chronic interstitial lung disease, compatible with the patient's clinical history of pulmonary fibrosis. Superimposed mild emphysematous changes. Mild thoracic lymphadenopathy, likely reactive.  CXR 2/4>> Chronic interstitial lung disease without definite superimposed acute process.  HISTORY OF PRESENT ILLNESS:   Jose Flores is a 67 y.o. male presenting with shortness of breath that started on 2/2. He is normally on 6L O2  at home  due to chronic ILD and was recently hospitalized twice 04/2017  due to difficulty breathing and was found to have viral URI due to RSV. At home he turned up oxygen to 10L which only helped a little with continued difficulty breathing at rest. After no improvement with increased oxygen requirement he came to [facility] ED 2/2. Pt had 3/4 SIRS criteria, but no fever.CT chest showed no focal consolidation but poor baseline lung status could be masking HAP. Patient had elevated d-dimer but CT Angio was negative for PE. BNP was 150.3  PCCM has been asked to consult and assist with care.  Pt. States home oxygen demand prior to [place] admissions in 04/2017 was 3 L at rest and 8 L with exertion. Since discharge from [place]  baseline has been 6 L at rest and max with exertion  Of note, patient acutely lost his hearing  after recent admission to the [place] in January 2019  SUBJECTIVE: Continues to desat off 100% NRB, currently requiring 10L HFNC, no new complaints  VITAL SIGNS: Temp:  [97.5 F (36.4 C)-98.4 F (36.9 C)] 97.7 F (36.5 C) (02/06 0811) Pulse Rate:  [78-107] 78 (02/06 0355) Resp:  [17-21] 21 (02/06 0355) BP: (116-150)/(64-91) 148/91 (02/06 0811) SpO2:  [92 %-100 %] 100 % (02/06 0355) FiO2 (%):  [55 %] 55 % (02/05 1201)  PHYSICAL EXAMINATION: General: Chronically ill appearing male, NAD on 10L HFNC Neuro:  Alert and interactive, moving all ext to command HEENT:  [place]/AT, PERRL, EOM-I and MMM Cardiovascular: RRR, Nl S1/S2 and -M/R/G Lungs: Diffuse crackles Abdomen: Soft, NT, ND and +BS Musculoskeletal: WNL Skin: Intact  Recent Labs  Lab 05/16/17 1255 05/17/17 0328 05/18/17 0219  NA 140 140 142  K 3.9 4.7 4.5  CL 102 106 107  CO2 25 24 25   BUN 10 17 21*  CREATININE 0.86 0.85 0.88  GLUCOSE 168* 169* 141*   Recent Labs  Lab 05/16/17 1255 05/17/17 0328 05/18/17 0219  HGB 12.8* 11.5* 10.6*  HCT 38.2* 35.6* 32.2*  WBC 14.7* 8.5 14.6*  PLT 177 158 194   Ct Angio Chest Pe W And/or Wo Contrast  Result Date: 05/16/2017 CLINICAL DATA:  Shortness of breath x1 week, chest pain x3 days. Positive D-dimer. History of pulmonary fibrosis and COPD. EXAM: CT ANGIOGRAPHY CHEST WITH CONTRAST TECHNIQUE: Multidetector CT imaging of the chest was performed using the standard protocol during bolus administration of intravenous contrast. Multiplanar CT image reconstructions and MIPs  were obtained to evaluate the vascular anatomy. CONTRAST:  100mL ISOVUE-370 IOPAMIDOL (ISOVUE-370) INJECTION 76% COMPARISON:  Chest radiographs dated 05/16/2017. FINDINGS: Cardiovascular: Satisfactory opacification of the bilateral pulmonary arteries to the segmental level. Evaluation of the bilateral lower lobe pulmonary arteries is mildly constrained by respiratory motion. No evidence of pulmonary embolism. No evidence of  thoracic aortic aneurysm or dissection. Heart is normal in size.  No pericardial effusion. Mild atherosclerotic calcifications of the aortic arch. Mediastinum/Nodes: Mild thoracic lymphadenopathy, likely reactive, including --16 mm short axis high right paratracheal node (series 13/image 21) --17 mm short axis AP window node (series 13/image 30) --19 mm short axis subcarinal node (series 13/image 40) Visualized thyroid is unremarkable. Lungs/Pleura: Evaluation of the lung parenchyma is constrained by respiratory motion. Within that constraint, there are no suspicious pulmonary nodules. Multifocal pulmonary opacities/fibrosis with associated traction bronchiectasis and possible mild honeycombing. This appearance favors a UIP pattern. Superimposed mild centrilobular and paraseptal emphysematous changes, upper lobe predominant. No focal consolidation. No pleural effusion or pneumothorax. Upper Abdomen: Visualized upper abdomen is grossly unremarkable. Musculoskeletal: Mild degenerative changes of the visualized thoracolumbar spine. Review of the MIP images confirms the above findings. IMPRESSION: No evidence of pulmonary embolism. Chronic interstitial lung disease, compatible with the patient's clinical history of pulmonary fibrosis. Superimposed mild emphysematous changes. Mild thoracic lymphadenopathy, likely reactive. Aortic Atherosclerosis (ICD10-I70.0) and Emphysema (ICD10-J43.9). Electronically Signed   By: Charline BillsSriyesh  Krishnan M.D.   On: 05/16/2017 15:30   Dg Chest Portable 1 View  Result Date: 05/16/2017 CLINICAL DATA:  Shortness of breath. EXAM: PORTABLE CHEST 1 VIEW COMPARISON:  None. FINDINGS: The heart size and mediastinal contours are within normal limits. Diffusely increased interstitial markings in the left greater than right lungs. Small right pleural effusion versus chronic scarring at the right costophrenic angle. No pneumothorax. No focal superimposed consolidation. No acute osseous abnormality.  IMPRESSION: 1. Chronic interstitial lung disease without definite superimposed acute process. Electronically Signed   By: Obie DredgeWilliam T Derry M.D.   On: 05/16/2017 13:04    Antibiotics:  Vanc 2/4>> Cefepime 2/4>>  Cultures Blood 2/4 RVP>> Negative  I reviewed chest CT myself, fibrotic changes noted  ASSESSMENT / PLAN: A: - Acute on Chronic Respiratory Failure 2/2 ILD with recent Viral URI ( RSV +)  January 2019  [place]qu[place]  [place] [facility] [place] - RSV negative 2/4 - COPD>> Home maintenance Spiriva and Symbicort, Albuterol nebs - Baseline oxygen demand 6L [place] - D-Dimer elevated but CTA negative for PE - Requiring BiPAP on admission - ? Bacterial Super infection as 3/4 SIRS Criteria ( HR 130, RR 26, WBC 14.7), but   Afebrile - BNP 150.3 - Possible ILD Flare Discussed with PCCM-NP  - Acute on Chronic Respiratory Failure 2/2 ILD with recent Viral URI ( RSV +)  January 2019  [place]qu[place]  [facility] [place] - RSV negative 2/4 ILD  Flare>> no ILD maintenance to slow progression  P: Restart BiPAP given WOB Increase solumedrol to 125 mg IV q6 x3 days then lower back since procal is 0.17, not indicative of any infection Titrate O2 for sat of 88-92% Monitor CBG as we increase solumedrol Continue Mucinex BID, with full glass of water Continue Spiriva 18 mcg daily Continue duonebs as ordered Albuterol Nebs prn as ordered PT/OT and OOB Would consider transfer to a transplant center if no improvement with solumedrol as increased above D/C prednisone while on solumedrol  COPD P: Continue spiriva 18 mcg daily Duonebs  BID and prn Albuterol Nebs prn  as ordered Follow up with Pulmonary at [facility] ( Has appointment with Dr. Shelle Iron in [place] 05/2017)  Procal is not supportive of bacterial super infection but given increase in steroids above would not change abx for now  P: Continue vanc and cefepime F/u on cultures Trend CBC and Fever Curve CXR to PRN  Alyson Reedy,  M.D. [facility] Pulmonary/Critical Care Medicine. Pager: [phone]. After hours pager: [phone].  05/18/2017, 8:36 AM

## 2017-05-18 NOTE — Progress Notes (Signed)
Pharmacy Antibiotic Note  Jose LimaRoger D Flores is a 67 y.o. male admitted on 05/16/2017 with sepsis and IPF flare.  Pharmacy consulted to restart cefepime and vancomycin dosing.  SCr stable, CrCl ~8180ml/min  Plan: Cefepime 1g IV Q8h Give vancomycin 1g IV Q12h Monitor clinical picture, renal function, VT prn F/U C&S, abx deescalation / LOT   Height: 5\' 8"  (172.7 cm) Weight: 166 lb (75.3 kg) IBW/kg (Calculated) : 68.4  Temp (24hrs), Avg:98 F (36.7 C), Min:97.6 F (36.4 C), Max:98.4 F (36.9 C)  Recent Labs  Lab 05/16/17 1255 05/17/17 0328 05/18/17 0219  WBC 14.7* 8.5 14.6*  CREATININE 0.86 0.85 0.88    Estimated Creatinine Clearance: 79.9 mL/min (by C-G formula based on SCr of 0.88 mg/dL).    Allergies  Allergen Reactions  . Gabapentin Other (See Comments)    [facility]  . Lisinopril Other (See Comments)    [facility]  . Penicillins Other (See Comments)    Further details unknown per pt. Has patient had a PCN reaction causing immediate rash, facial/tongue/throat swelling, SOB or lightheadedness with hypotension: Unknown Has patient had a PCN reaction causing severe rash involving mucus membranes or skin necrosis: Unknown Has patient had a PCN reaction that required hospitalization: Yes Has patient had a PCN reaction occurring within the last 10 years: Unknown If all of the above answers are "NO", then may proceed with Cephalosporin use.      Thank you for allowing pharmacy to be a part of this patient's care.  Vinnie LevelBenjamin Ama Mcmaster, PharmD., BCPS Clinical Pharmacist Pager [phone]

## 2017-05-18 NOTE — Progress Notes (Signed)
Family Medicine Teaching Service Daily Progress Note Intern Pager: [phone]  Patient name: Jose LimaRoger D Holohan Medical record number: [id number] Date of birth: 01-17-1951 [id number]: 67 y.o. Gender: male  [facility] Consultants: Pulmonology Code Status: Full  Pt Overview and Major Events to Date:  Jose Flores is a 67 y.o. male presenting with SOB after a recent viral illness due to RSV. PMH is significant for HTN, pulmonary fibrosis, COPD, GERD  Assessment and Plan:  Idiopathic pulmonary fibrosis flare:  Weaned to nasal cannula 9 L.  Maintaining saturations well on this. Baseline on 6L O2 home.   Transitioned to p.o. Steroids given clinical improvement.  Given low pro-calcitonin, antibiotics were de-escalated to oral doxycycline.  Patient is tolerating this well.  Continue to wean as tolerated.  Plan to transfer to floor.   -WAT high flow as tolerated -Day 2 of 5 of steroids      -IV Solu-Medrol 125 mg (2/5)    -Prednisone 50 mg p.o. (2/6-2/9) - Cont home Spiriva 18mcg daily -Day 2 of 7 antibiotics      -Vanc and Cefepime (2/4-)     -Doxycycline - Guaifenesin  - BCx x 2 NG 24 hours - Pending sputum culture  -Duo nebs as needed -Pulmonary consulted, appreciate recommendations -PT/OT  COPD: Given chronic 6 L of nasal cannula at home, patient likely needs to be on triple therapy.  [place] of [place] medications. -Respiratory care as listed above  HTN: Chronic.  Normotensive.  Patient endorses taking a medication for his blood pressure but has no medication list and does not know what he takes for sure. - monitor BP  - [facility] and [facility]  FEN/GI:  Heart healthy diet Prophylaxis: Lovenox  Disposition: Transfer to floor, likely can go home tomorrow if maintained improvement   subjective:  Feels her breathing is much improved.  Denies any chest pain or shortness of breath.  No other  complaints  Objective: Temp:  [97.5 F (36.4 C)-98.4 F (36.9 C)] 98.1 F (36.7 C) (02/06 0355) Pulse Rate:  [78-107] 78 (02/06 0355) Resp:  [17-21] 21 (02/06 0355) BP: (116-150)/(64-81) 116/64 (02/06 0355) SpO2:  [92 %-100 %] 100 % (02/06 0355) FiO2 (%):  [50 %-55 %] 55 % (02/05 1201) Physical Exam: General: Resting in bed, no acute distress on 9 L Cardiovascular: Regular rate and rhythm, no murmurs or gallops Respiratory:  normal work of breathing, Velcro-like crackles heard throughout lower and lateral lung fields Abdomen: Soft nondistended nontender Extremities: No lower extremity edema 2+ tablets pedis  Laboratory: Recent Labs  Lab 05/16/17 1255 05/17/17 0328 05/18/17 0219  WBC 14.7* 8.5 14.6*  HGB 12.8* 11.5* 10.6*  HCT 38.2* 35.6* 32.2*  PLT 177 158 194   Recent Labs  Lab 05/16/17 1255 05/17/17 0328 05/18/17 0219  NA 140 140 142  K 3.9 4.7 4.5  CL 102 106 107  CO2 25 24 25   BUN 10 17 21*  CREATININE 0.86 0.85 0.88  CALCIUM 8.7* 8.8* 8.8*  PROT 6.2*  --   --   BILITOT 0.9  --   --   ALKPHOS 69  --   --   ALT 16*  --   --   AST 21  --   --   GLUCOSE 168* 169* 141*    [name], [name], MD 05/18/2017, 7:16 AM PGY-1, [facility] Family Medicine FPTS Intern pager: [phone], text pages welcome

## 2017-05-18 NOTE — Progress Notes (Signed)
Spoke with Dr. Molli KnockYacoub regarding patient's status.  He feels that given patient's continued increased oxygen demand that he would increase steroids and resume IV antibiotics as charted in his note until his respiratory status has improved.  Will keep patient in stepdown and reinitiate IV steroids and IV vancomycin and cefepime.

## 2017-05-19 DIAGNOSIS — G4733 Obstructive sleep apnea (adult) (pediatric): Secondary | ICD-10-CM

## 2017-05-19 LAB — CBC
HCT: 31.6 % — ABNORMAL LOW (ref 39.0–52.0)
HEMOGLOBIN: 10.6 g/dL — AB (ref 13.0–17.0)
MCH: 28.6 pg (ref 26.0–34.0)
MCHC: 33.5 g/dL (ref 30.0–36.0)
MCV: 85.2 fL (ref 78.0–100.0)
Platelets: 194 10*3/uL (ref 150–400)
RBC: 3.71 MIL/uL — ABNORMAL LOW (ref 4.22–5.81)
RDW: 13.4 % (ref 11.5–15.5)
WBC: 11.3 10*3/uL — ABNORMAL HIGH (ref 4.0–10.5)

## 2017-05-19 LAB — BASIC METABOLIC PANEL
Anion gap: 10 (ref 5–15)
BUN: 19 mg/dL (ref 6–20)
CHLORIDE: 102 mmol/L (ref 101–111)
CO2: 26 mmol/L (ref 22–32)
CREATININE: 0.84 mg/dL (ref 0.61–1.24)
Calcium: 8.6 mg/dL — ABNORMAL LOW (ref 8.9–10.3)
GFR calc Af Amer: >60 mL/min (ref >60)
GFR calc non Af Amer: >60 mL/min (ref >60)
Glucose, Bld: 205 mg/dL — ABNORMAL HIGH (ref 65–99)
Potassium: 4.1 mmol/L (ref 3.5–5.1)
SODIUM: 138 mmol/L (ref 135–145)

## 2017-05-19 LAB — PROCALCITONIN: Procalcitonin: <0.1 ng/mL

## 2017-05-19 LAB — PHOSPHORUS: PHOSPHORUS: 3.2 mg/dL (ref 2.5–4.6)

## 2017-05-19 LAB — EXPECTORATED SPUTUM ASSESSMENT W GRAM STAIN, RFLX TO RESP C

## 2017-05-19 LAB — EXPECTORATED SPUTUM ASSESSMENT W REFEX TO RESP CULTURE

## 2017-05-19 MED ORDER — METHYLPREDNISOLONE SODIUM SUCC 125 MG IJ SOLR
125.0000 mg | Freq: Four times a day (QID) | INTRAMUSCULAR | Status: AC
  Administered 2017-05-19 – 2017-05-20 (×7): 125 mg via INTRAVENOUS
  Filled 2017-05-19 (×7): qty 2

## 2017-05-19 MED ORDER — FUROSEMIDE 10 MG/ML IJ SOLN
40.0000 mg | Freq: Three times a day (TID) | INTRAMUSCULAR | Status: AC
  Administered 2017-05-19 (×2): 40 mg via INTRAVENOUS
  Filled 2017-05-19 (×2): qty 4

## 2017-05-19 MED ORDER — POTASSIUM & SODIUM PHOSPHATES 280-160-250 MG PO PACK
1.0000 | PACK | Freq: Three times a day (TID) | ORAL | Status: AC
  Administered 2017-05-19: 1 via ORAL
  Filled 2017-05-19 (×2): qty 1

## 2017-05-19 NOTE — Progress Notes (Signed)
Name: Jose Flores MRN: [id number] DOB: 10/31/1950    ADMISSION DATE:  05/16/2017 CONSULTATION DATE:  05/17/2017  REFERRING MD :  [facility]  CHIEF COMPLAINT:  COPD/Pulmonary Fibrosis/ BIPAP  BRIEF PATIENT DESCRIPTION:  Adult male supine in bed wearing venti mask, speaking in full sentences with saturation of 100%. Some respiratory accessory muscle use noted. Alert and appropriate. Able to give good history.  SIGNIFICANT EVENTS  04/2017>> 2 admissions [facility] with Acute on chronic respiratory failure  + RSV 2/4>> Admission with A on C Respiratory Failure, RSV negative  STUDIES:  CTA 2/4>>: No evidence of pulmonary embolism. Chronic interstitial lung disease, compatible with the patient's clinical history of pulmonary fibrosis. Superimposed mild emphysematous changes. Mild thoracic lymphadenopathy, likely reactive.  CXR 2/4>> Chronic interstitial lung disease without definite superimposed acute process.  HISTORY OF PRESENT ILLNESS:   Jose Flores is a 67 y.o. male presenting with shortness of breath that started on 2/2. He is normally on 6L O2  at home  due to chronic ILD and was recently hospitalized twice 04/2017  due to difficulty breathing and was found to have viral URI due to RSV. At home he turned up oxygen to 10L which only helped a little with continued difficulty breathing at rest. After no improvement with increased oxygen requirement he came to the [facility] ED 2/2. Pt had 3/4 SIRS criteria, but no fever.CT chest showed no focal consolidation but poor baseline lung status could be masking HAP. Patient had elevated d-dimer but CT Angio was negative for PE. BNP was 150.3  PCCM has been asked to consult and assist with care.  Pt. States home oxygen demand prior to [place] admissions in 04/2017 was 3 L at rest and 8 L with exertion. Since discharge from [place]  baseline has been 6 L at rest and max with exertion  Of note, patient acutely lost his hearing  after recent admission to the [place] in January 2019  SUBJECTIVE: Significant desaturation post mobilization today, did not use CPAP overnight.  VITAL SIGNS: Temp:  [97.6 F (36.4 C)-98.6 F (37 C)] 97.9 F (36.6 C) (02/07 0711) Pulse Rate:  [89-103] 89 (02/07 0400) Resp:  [19-22] 22 (02/07 0400) BP: (137-154)/(79-88) 150/85 (02/07 0400) SpO2:  [85 %-100 %] 96 % (02/07 0400)  PHYSICAL EXAMINATION: General: Chronically ill appearing male on 10L [place] in mild respiratory distress Neuro:  Alert and oriented, moving all ext to command HEENT:  [place]/AT, PERRL, EOM-I and MMM Cardiovascular: RRR, Nl S1/S2 and -M/R/G Lungs: Diffuse crackles Abdomen: Soft, NT, ND and +BS Musculoskeletal: WNL Skin: Intact  Recent Labs  Lab 05/17/17 0328 05/18/17 0219 05/19/17 0233  NA 140 142 138  K 4.7 4.5 4.1  CL 106 107 102  CO2 24 25 26   BUN 17 21* 19  CREATININE 0.85 0.88 0.84  GLUCOSE 169* 141* 205*   Recent Labs  Lab 05/17/17 0328 05/18/17 0219 05/19/17 0233  HGB 11.5* 10.6* 10.6*  HCT 35.6* 32.2* 31.6*  WBC 8.5 14.6* 11.3*  PLT 158 194 194   No results found.  Antibiotics:  Vanc 2/4>> Cefepime 2/4>>  Cultures Blood 2/4 RVP>> Negative  I reviewed chest CT myself, fibrotic changes noted  ASSESSMENT / PLAN: A: - Acute on Chronic Respiratory Failure 2/2 ILD with recent Viral URI ( RSV +)  January 2019  Requiring  2 hospital admissions ( [place]). - RVP negative 2/4 - COPD>> Home maintenance Spiriva and Symbicort, Albuterol nebs - Baseline oxygen  demand 6L [place] - D-Dimer elevated but CTA negative for PE - Requiring BiPAP on admission now on HFNC - BNP 150.3 - Likely ILD Flare Discussed with PCCM-NP  - Acute on Chronic Respiratory Failure 2/2 ILD with recent Viral URI ( RSV +)  January 2019  [place]qu[place]  [place] - RSV negative 2/4 ILD  Flare>> no ILD maintenance to slow progression  P: Change to CPAP at night for OSA Increased solumedrol to 125 mg IV q6 x3 days  then lower back since procal is 0.17, not indicative of any infection Titrate O2 for sat of 88-92% Monitor CBG as we increase solumedrol Continue Mucinex BID, with full glass of water Continue Spiriva 18 mcg daily Continue duonebs as ordered Albuterol Nebs prn as ordered PT/OT and OOB Give 2 more days of high dose steroids prior to any aggressive changes Diureses as ordered (Lasix 40 mg x2 doses)  COPD P: Continue spiriva 18 mcg daily Duonebs  BID and prn Albuterol Nebs prn as ordered [facility] ( Has appointment with Dr. Shelle Ironlance in [place] 05/2017, doubt will make that appointment at this time)  Procal is not supportive of bacterial super infection but given increase in steroids above would not change abx for now  P: Continue vanc and cefepime for now, would like to see some improvement in O2 demand prior to narrowing abx F/u on cultures Trend CBC and Fever Curve CXR to PRN  Alyson ReedyWesam G. Laura-Lee Villegas, M.D. [facility]. Pager: [phone]. After hours pager: [phone].  05/19/2017, 8:58 AM

## 2017-05-19 NOTE — Progress Notes (Signed)
Family Medicine Teaching Service Daily Progress Note Intern Pager: [phone]  Patient name: Jose Flores Medical record number: [id number] Date of birth: June 18, 1950 Age: 67 y.o. Gender: male  Primary Care Provider: [facility] Consultants: Pulmonology Code Status: Full  Pt Overview and Major Events to Date:  Jose Flores is a 67 y.o. male presenting with SOB after a recent viral illness due to RSV. PMH is significant for HTN, pulmonary fibrosis, COPD, GERD  Assessment and Plan:  Idiopathic pulmonary fibrosis flare: Currently on 8 L nasal cannula, was on  6 L before hospitalization, but had 4 L before 2 recent hospitalizations over the past month. Patient had worsening condition yesterday.  Was re-escalated to IV Solu-Medrol and IV cefepime/vancomycin.  Pro-calcitonin went from 0.17 to  <0.10 supporting de-escalation of antibiotics. However considering incomplete improvement of oxygenation, consider continuing antibiotic therapy.    Pulmonary recommends possible transfer to transplant center if no improvement on Solu-Medrol -WAT high flow as tolerated -Day 3 of steroids      -IV Solu-Medrol 125 mg every 6 (2/5, 2/7-2/8)    -Prednisone 50 mg p.o. (2/6,)  - Cont home Spiriva daily -Day 3 antibiotics      -Vanc and Cefepime (2/4, 2/6-)     -Doxycycline (2/5-2/6) - Guaifenesin  - BCx x 2 NG 48 hours - Pending sputum culture  -Duo nebs as needed -Pulmonary consulted, appreciate recommendations -Home Spiriva -Daily CBC, BMP  Hypophosphatemia Mildly low phosphorus at 2.3.  Replete with Phos-Nak Recheck phosphorus  COPD: Given chronic 4-6L of nasal cannula at home, patient likely needs to be on triple therapy.  We have requested records from the [place] to get correct dosage of home medications. -Respiratory care as listed above  HTN: Chronic.  Mildly hypertensive at 150/85.  Patient endorses taking a medication for his blood pressure but has no medication list and does  not know what he takes for sure. - monitor BP  -Awaiting contact from [place] for home meds  PT/OT -PT-recommend home health PT -OT-eval pending  FEN/GI:  Heart healthy diet Prophylaxis: Lovenox  Disposition: Remain stepdown until improved lung condition  Subjective:  Feels that his breathing is better today especially overnight.  Denies any chest pain.    Objective: Temp:  [97.6 F (36.4 C)-98.6 F (37 C)] 97.6 F (36.4 C) (02/07 0400) Pulse Rate:  [89-103] 89 (02/07 0400) Resp:  [19-22] 22 (02/07 0400) BP: (137-154)/(79-91) 150/85 (02/07 0400) SpO2:  [85 %-100 %] 96 % (02/07 0400) Physical Exam: General: Sitting up in chair, eating breakfast, no acute distress Cardiovascular: Regular rate and rhythm, no murmurs or gallops Respiratory:  normal work of breathing, Velcro-like crackles heard throughout lower and lateral lung fields, 8 L nasal cannula Abdomen: Soft nondistended nontender Extremities: No lower extremity edema 2+ tablets pedis  Laboratory: Recent Labs  Lab 05/17/17 0328 05/18/17 0219 05/19/17 0233  WBC 8.5 14.6* 11.3*  HGB 11.5* 10.6* 10.6*  HCT 35.6* 32.2* 31.6*  PLT 158 194 194   Recent Labs  Lab 05/16/17 1255 05/17/17 0328 05/18/17 0219 05/19/17 0233  NA 140 140 142 138  K 3.9 4.7 4.5 4.1  CL 102 106 107 102  CO2 25 24 25 26   BUN 10 17 21* 19  CREATININE 0.86 0.85 0.88 0.84  CALCIUM 8.7* 8.8* 8.8* 8.6*  PROT 6.2*  --   --   --   BILITOT 0.9  --   --   --   ALKPHOS 69  --   --   --  ALT 16*  --   --   --   AST 21  --   --   --   GLUCOSE 168* 169* 141* 205*    [name], Terri Malerba B, MD 05/19/2017, 6:58 AM PGY-1, [facility] FPTS Intern pager: [phone], text pages welcome

## 2017-05-19 NOTE — Evaluation (Signed)
Physical Therapy Evaluation Patient Details Name: Jose Flores MRN: [id number] DOB: 06-Mar-1951 Today's Date: 05/19/2017   History of Present Illness  Pt is a 67 y.o. 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 PMH is significant for HTN, pulmonary fibrosis, COPD (6L home O2).    Clinical Impression  Pt presents with an overall decrease in functional mobility secondary to above. PTA, pt lives with wife and mod indep with rollator; has recently been limited to shorter household distances due to DOE. Today, pt moves well, but limited to short amb distance secondary to DOE. SpO2 down to 70% on 10L O2 [place] while using bathroom; RN aware and initiated breathing treatment. Pt would benefit from continued acute PT services to maximize functional mobility and independence prior to d/c with HHPT services.     Follow Up Recommendations Home health PT;Supervision/Assistance - 24 hour    Equipment Recommendations  None recommended by PT    Recommendations for Other Services OT consult     Precautions / Restrictions Precautions Precautions: Fall Precaution Comments: Watch SpO2 Restrictions Weight Bearing Restrictions: No      Mobility  Bed Mobility Overal bed mobility: Independent             General bed mobility comments: Increased SOB. SpO2 down to 80% on 10L O2 [place] when coming to sit EOB, pt also coughing  Transfers Overall transfer level: Needs assistance Equipment used: Rolling walker (2 wheeled) Transfers: Sit to/from Stand Sit to Stand: Supervision         General transfer comment: Stood from bed and toilet with RW and supervision for safety. Indep with pericare in standing  Ambulation/Gait Ambulation/Gait assistance: Supervision Ambulation Distance (Feet): 10 Feet Assistive device: Rolling walker (2 wheeled) Gait Pattern/deviations: Step-through pattern;Decreased stride length Gait velocity:  Decreased Gait velocity interpretation: <1.8 ft/sec, indicative of risk for recurrent falls General Gait Details: Limited to in-room ambulation secondary to significant WOB and decreased SpO2. Pt able to amb in/out of bathroom with RW and supervision for safety. Slight bilat knee instability noted, but no overt LOB. Pt fatigued with SpO2 down to 70% upon returning to recliner on 10L O2 [place] (RN notified)  Stairs            Wheelchair Mobility    Modified Rankin (Stroke Patients Only)       Balance Overall balance assessment: Needs assistance   Sitting balance-Leahy Scale: Good Sitting balance - Comments: Indep don socks while sitting EOB       Standing balance comment: Can static stand with no UE support; reliant on UE support for dynamic balance                             Pertinent Vitals/Pain Pain Assessment: No/denies pain    Home Living Family/patient expects to be discharged to:: Private residence Living Arrangements: Spouse/significant other Available Help at Discharge: Family;Available 24 hours/day Type of Home: House Home Access: Stairs to enter Entrance Stairs-Rails: None Entrance Stairs-Number of Steps: 3 Home Layout: One level Home Equipment: Walker - 4 wheels;Wheelchair - manual      Prior Function Level of Independence: Independent         Comments: Prior to initial illness in December, pt indep with use of rollator. Since "getting sick," pt has been limited with household distance, relying on w/c sometimes due to significant SOB     Hand  Dominance        Extremity/Trunk Assessment   Upper Extremity Assessment Upper Extremity Assessment: Generalized weakness    Lower Extremity Assessment Lower Extremity Assessment: Generalized weakness       Communication   Communication: HOH(R ear hearing aid)  Cognition Arousal/Alertness: Awake/alert Behavior During Therapy: WFL for tasks assessed/performed Overall Cognitive Status:  Within Functional Limits for tasks assessed                                        General Comments General comments (skin integrity, edema, etc.): SpO2 down to 70% on 10L O2 [place] after using bathroom; RN notified and initiated breathing treatment    Exercises     Assessment/Plan    PT Assessment Patient needs continued PT services  PT Problem List Decreased strength;Decreased activity tolerance;Decreased balance;Decreased mobility;Cardiopulmonary status limiting activity       PT Treatment Interventions DME instruction;Gait training;Stair training;Functional mobility training;Therapeutic activities;Therapeutic exercise;Balance training;Patient/family education    PT Goals (Current goals can be found in the Care Plan section)  Acute Rehab PT Goals Patient Stated Goal: Be able to breathe better PT Goal Formulation: With patient Time For Goal Achievement: 06/02/17 Potential to Achieve Goals: Good    Frequency Min 3X/week   Barriers to discharge        Co-evaluation               AM-PAC PT "6 Clicks" Daily Activity  Outcome Measure Difficulty turning over in bed (including adjusting bedclothes, sheets and blankets)?: None Difficulty moving from lying on back to sitting on the side of the bed? : None Difficulty sitting down on and standing up from a chair with arms (e.g., wheelchair, bedside commode, etc,.)?: A Little Help needed moving to and from a bed to chair (including a wheelchair)?: A Little Help needed walking in hospital room?: A Little Help needed climbing 3-5 steps with a railing? : A Lot 6 Click Score: 19    End of Session Equipment Utilized During Treatment: Oxygen Activity Tolerance: Patient tolerated treatment well;Treatment limited secondary to medical complications (Comment)(Drop in SpO2) Patient left: in chair;with call bell/phone within reach;with nursing/sitter in room Nurse Communication: Mobility status PT Visit Diagnosis: Other  abnormalities of gait and mobility (R26.89);Other (comment)(Increased O2 needs)    Time: [id number] PT Time Calculation (min) (ACUTE ONLY): 28 min   Charges:   PT Evaluation $PT Eval Moderate Complexity: 1 Mod PT Treatments $Therapeutic Activity: 8-22 mins   PT G Codes:       Ina HomesJaclyn Alexio Sroka, PT, DPT Acute Rehab Services  Pager: [phone]  Malachy ChamberJaclyn L Wave Calzada 05/19/2017, 8:45 AM

## 2017-05-19 NOTE — Progress Notes (Addendum)
OT Cancellation Note  Patient Details Name: Jose LimaRoger D Schweigert MRN: [id number] DOB: 06/17/1950   Cancelled Treatment:    Reason Eval/Treat Not Completed: Patient declined, no reason specified. OT attempted to see Pt and Nutrition Services delivered lunch, Pt requested OT to come later in the day. OT will attempt eval later as schedule allows.   Evern BioLaura J Shaquon Gropp 05/19/2017, 12:06 PM  Follow up from previous note: Schedule did not allow for return today. OT will continue to follow for eval tomorrow.  Sherryl MangesLaura Kamaal Cast OTR/L [phone]

## 2017-05-19 NOTE — Plan of Care (Signed)
Continue current care plan 

## 2017-05-20 ENCOUNTER — Inpatient Hospital Stay (HOSPITAL_COMMUNITY): Payer: Medicare PPO

## 2017-05-20 DIAGNOSIS — E876 Hypokalemia: Secondary | ICD-10-CM

## 2017-05-20 DIAGNOSIS — E440 Moderate protein-calorie malnutrition: Secondary | ICD-10-CM

## 2017-05-20 LAB — BASIC METABOLIC PANEL
Anion gap: 13 (ref 5–15)
BUN: 32 mg/dL — AB (ref 6–20)
CALCIUM: 8.9 mg/dL (ref 8.9–10.3)
CHLORIDE: 97 mmol/L — AB (ref 101–111)
CO2: 25 mmol/L (ref 22–32)
CREATININE: 1.04 mg/dL (ref 0.61–1.24)
GFR calc Af Amer: >60 mL/min (ref >60)
GFR calc non Af Amer: >60 mL/min (ref >60)
Glucose, Bld: 296 mg/dL — ABNORMAL HIGH (ref 65–99)
Potassium: 3.6 mmol/L (ref 3.5–5.1)
SODIUM: 135 mmol/L (ref 135–145)

## 2017-05-20 LAB — CBC
HCT: 33.3 % — ABNORMAL LOW (ref 39.0–52.0)
HEMOGLOBIN: 11.1 g/dL — AB (ref 13.0–17.0)
MCH: 28.4 pg (ref 26.0–34.0)
MCHC: 33.3 g/dL (ref 30.0–36.0)
MCV: 85.2 fL (ref 78.0–100.0)
Platelets: 206 10*3/uL (ref 150–400)
RBC: 3.91 MIL/uL — ABNORMAL LOW (ref 4.22–5.81)
RDW: 13.5 % (ref 11.5–15.5)
WBC: 13.5 10*3/uL — ABNORMAL HIGH (ref 4.0–10.5)

## 2017-05-20 LAB — EXPECTORATED SPUTUM ASSESSMENT W GRAM STAIN, RFLX TO RESP C

## 2017-05-20 LAB — EXPECTORATED SPUTUM ASSESSMENT W REFEX TO RESP CULTURE

## 2017-05-20 MED ORDER — METHYLPREDNISOLONE SODIUM SUCC 40 MG IJ SOLR
40.0000 mg | Freq: Two times a day (BID) | INTRAMUSCULAR | Status: DC
  Administered 2017-05-21 – 2017-05-23 (×5): 40 mg via INTRAVENOUS
  Filled 2017-05-20 (×5): qty 1

## 2017-05-20 MED ORDER — GUAIFENESIN 100 MG/5ML PO SOLN
10.0000 mL | ORAL | Status: DC | PRN
  Administered 2017-05-20: 200 mg via ORAL
  Filled 2017-05-20: qty 10

## 2017-05-20 MED ORDER — AMOXICILLIN-POT CLAVULANATE 875-125 MG PO TABS
1.0000 | ORAL_TABLET | Freq: Two times a day (BID) | ORAL | Status: DC
  Administered 2017-05-21 – 2017-05-23 (×5): 1 via ORAL
  Filled 2017-05-20 (×6): qty 1

## 2017-05-20 MED ORDER — POTASSIUM CHLORIDE CRYS ER 20 MEQ PO TBCR
40.0000 meq | EXTENDED_RELEASE_TABLET | Freq: Three times a day (TID) | ORAL | Status: AC
  Administered 2017-05-20 (×2): 40 meq via ORAL
  Filled 2017-05-20 (×2): qty 2

## 2017-05-20 MED ORDER — FUROSEMIDE 10 MG/ML IJ SOLN
40.0000 mg | Freq: Three times a day (TID) | INTRAMUSCULAR | Status: AC
  Administered 2017-05-20 (×2): 40 mg via INTRAVENOUS
  Filled 2017-05-20 (×2): qty 4

## 2017-05-20 NOTE — Progress Notes (Signed)
Pt doesn't know CPAP settings.  RT placed pt on CPAP but had to keep adjusting the pressure because pt kept desatting into the low 80's. A pressure of 16 was set on CPAP combined with 10L of 02 bled in.  Pt's oxygen dropped back in to the low 80's.  RT went back to check on pt and CNA had placed pt back on 8L on 02.  Pt on no distress at this time and SATS are 92%.  Pt stated he would have wife bring his home CPAP in.

## 2017-05-20 NOTE — Care Management Note (Signed)
Case Management Note  Patient Details  Name: Jose LimaRoger D Flores MRN: [id number] Date of Birth: 13-Jun-1950  Subjective/Objective:    PT admitted with COPD excerbation                Action/Plan:   PTA independent from home.  Pt is on supplemental oxygen 6-10 liters continuous via [facility] benefit.  Pt has liquid oxygen concentrator to accommodate higher oxygen needs.  Pt has required oxygen for transport home.  Pt states he has notified [facility] that [facility] has been [facility].  CM also contacted [facility] and informed of [facility].  [facility]  CM offered choice of HH and [facility] - [facility] and referral accepted   Expected Discharge Date:                  Expected Discharge Plan:  Home w Home Health Services  In-House Referral:     Discharge planning Services  CM Consult  Post Acute Care Choice:    Choice offered to:  Patient  DME Arranged:  Bedside commode DME Agency:  [facility]  HH Arr[facility]  [facility]  Status of Service:  In process, will continue to follow  If discussed at Long Length of Stay Meetings, dates discussed:    Additional Comments:  Cherylann ParrClaxton, Anushree Dorsi S, RN 05/20/2017, 3:58 PM

## 2017-05-20 NOTE — Progress Notes (Signed)
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  GOAL:   Patient will meet greater than or equal to 90% of their needs  Progressing  MONITOR:   PO intake, Supplement acceptance, Labs, Weight trends, Skin, I & O's  REASON FOR ASSESSMENT:   Malnutrition Screening Tool   ASSESSMENT:   Jose Flores is a 67 y.o. male presenting with SOB after a recent viral illness due to RSV. PMH is significant for HTN, pulmonary fibrosis, COPD, GERD.  Pt admitted with respiratory failure due to interstitial lung disease with viral URI.  Spoke with pt and sister at bedside. Pt reports good appetite during current admission and 100% completion of meals yesterday and breakfast this morning. Noted meal completion logged as 25-50% yesterday. Meal completion logged as 100% on 05/18/17. Pt states he enjoys the Ensure Enlive oral nutrition supplements and prefers vanilla flavor. RD will continue with current supplementation.  Pt states that he spent most of the month of January in the hospital. Pt states that prior to most recent hospital admissions, he ate 2 meals/day. Pt states that breakfast might include peanut butter crackers and lunch/dinner may include a chicken salad sandwich. Pt does most of the cooking at home.  Pt states that since 2015, he has lost 80 lbs "without trying." Pt reports most recent weight loss started approximately 6 months ago. Pt states his UBW is 190-195 lbs and that he last weighed this 6 months ago. This is a 14.9% weight loss which is significant for timeframe.  I/O's: -1.3 L since  admission  Medications reviewed.  Labs reviewed: BUN 32 (H), glucose 296 (H)  NUTRITION - FOCUSED PHYSICAL EXAM:    Most Recent Value  Orbital Region  Mild depletion  Upper Arm Region  Mild depletion  Thoracic and Lumbar Region  Mild depletion  Buccal Region  No depletion  Temple Region  No depletion  Clavicle Bone Region  Severe depletion  Clavicle and Acromion Bone Region  Moderate depletion  Scapular Bone Region  Moderate depletion  Dorsal Hand  No depletion  Patellar Region  Moderate depletion  Anterior Thigh Region  Moderate depletion  Posterior Calf Region  Moderate depletion  Edema (RD Assessment)  None  Hair  Reviewed  Eyes  Reviewed  Mouth  Reviewed  Skin  Reviewed  Nails  Reviewed      Malnutrition diagnosis after completion of NFPE.  Diet Order:  Diet Heart Room service appropriate? Yes; Fluid consistency: Thin  EDUCATION NEEDS:   No education needs have been identified at this time  Skin:  Skin Assessment: Reviewed RN Assessment  Last BM:  05/19/17 small type 6  Height:   Ht Readings from Last 1 Encounters:  05/16/17 5\' 8"  (1.727 m)    Weight:   Wt Readings from Last 1 Encounters:  05/16/17 166 lb (75.3 kg)    Ideal Body Weight:  70 kg  BMI:  Body mass index is 25.24 kg/m.  Estimated Nutritional Needs:   Kcal:  2250-2450  Protein:  115-130 grams  Fluid:  2.2-2.4 L    Earma ReadingKate Jablonski Sabria Florido, MS, RD, LDN Pager: [phone] Weekend/After Hours: [phone]

## 2017-05-20 NOTE — Evaluation (Signed)
Occupational Therapy Evaluation Patient Details Name: Jose Flores MRN: [id number] DOB: 1950/10/04 Today's Date: 05/20/2017    History of Present Illness Pt is a 67 y.o. 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 PMH is significant for HTN, pulmonary fibrosis, COPD (6L home O2).   Clinical Impression   PT Pt mod I for mobility with RW and ADL with shower seat and PRN assist for socks/shoes from wife. Pt is at supervision level for ADL but presents with decreased activity tolerance, which impacts his ability to perform ADL. Educated verbally on energy conservation and AE/DME to assist. Down to 71% on 8L O2 [place] with short ambulation distance, returning to 91% with seated rest break and deep breathing. Pt will benefit from skilled OT in the acute setting to maximize safety and independence in ADL and functional transfers. Pt will require a 3 in 1 and next session, please bring energy conservation handout.     Follow Up Recommendations  No OT follow up;Supervision/Assistance - 24 hour(initially)    Equipment Recommendations  3 in 1 bedside commode    Recommendations for Other Services       Precautions / Restrictions Precautions Precautions: Fall Precaution Comments: Watch SpO2 Restrictions Weight Bearing Restrictions: No      Mobility Bed Mobility Overal bed mobility: Independent             General bed mobility comments: Pt finishing walk in hall when OT arrived  Transfers Overall transfer level: Needs assistance Equipment used: Rolling walker (2 wheeled) Transfers: Sit to/from Stand Sit to Stand: Supervision         General transfer comment: Supervision for safety. Pt able to stand with and without RW    Balance Overall balance assessment: Needs assistance   Sitting balance-Leahy Scale: Good       Standing balance-Leahy Scale: Fair Standing balance comment: Can static stand with no  UE support; reliant on UE support for dynamic balance                           ADL either performed or assessed with clinical judgement   ADL Overall ADL's : At baseline                                       General ADL Comments: Pt presents with decreased activity tolerance, Pt has shower chair and uses AE (long handle sponge) discussed energy conservation. Pt able to cross legs to don sock (but wife assists PRN)     Vision Patient Visual Report: No change from baseline       Perception     Praxis      Pertinent Vitals/Pain Pain Assessment: No/denies pain     Hand Dominance Right   Extremity/Trunk Assessment Upper Extremity Assessment Upper Extremity Assessment: Generalized weakness   Lower Extremity Assessment Lower Extremity Assessment: Generalized weakness   Cervical / Trunk Assessment Cervical / Trunk Assessment: Normal   Communication Communication Communication: HOH(R ear hearing aide)   Cognition Arousal/Alertness: Awake/alert Behavior During Therapy: WFL for tasks assessed/performed Overall Cognitive Status: Within Functional Limits for tasks assessed  General Comments  SpO2 down to 74% on 8L O2 via [place] post hallway ambulation with PT. Pt sitting in chair with pursed lip breathing techniques (Pt initiated) and able to achieve 90%+ on 8L O2.     Exercises     Shoulder Instructions      Home Living Family/patient expects to be discharged to:: Private residence Living Arrangements: Spouse/significant other Available Help at Discharge: Family;Available 24 hours/day Type of Home: House Home Access: Stairs to enter Entergy CorporationEntrance Stairs-Number of Steps: 3 Entrance Stairs-Rails: None Home Layout: One level     Bathroom Shower/Tub: Chief Strategy OfficerTub/shower unit   Bathroom Toilet: Standard Bathroom Accessibility: Yes How Accessible: Accessible via walker Home Equipment: Walker - 4  wheels;Wheelchair - manual;Shower seat          Prior Functioning/Environment Level of Independence: Independent        Comments: every once and a while Pt gets assist for his socks from his wife        OT Problem List: Decreased strength;Decreased activity tolerance;Impaired balance (sitting and/or standing)      OT Treatment/Interventions: Self-care/ADL training;Therapeutic exercise;Energy conservation;DME and/or AE instruction;Therapeutic activities;Patient/family education;Balance training    OT Goals(Current goals can be found in the care plan section) Acute Rehab OT Goals Patient Stated Goal: Be able to breathe better OT Goal Formulation: With patient Time For Goal Achievement: 06/03/17 Potential to Achieve Goals: Good ADL Goals Pt Will Perform Grooming: with modified independence;standing Pt Will Transfer to Toilet: with modified independence;ambulating Pt Will Perform Toileting - Clothing Manipulation and hygiene: with modified independence;sit to/from stand Pt Will Perform Tub/Shower Transfer: Tub transfer;with supervision;with caregiver independent in assisting;ambulating;shower seat;rolling walker Additional ADL Goal #1: Pt will recall 3 ways of conserving energy in ADL with 2 or less verbal cues  OT Frequency: Min 2X/week   Barriers to D/C:            Co-evaluation              AM-PAC PT "6 Clicks" Daily Activity     Outcome Measure Help from another person eating meals?: None Help from another person taking care of personal grooming?: A Little Help from another person toileting, which includes using toliet, bedpan, or urinal?: A Little Help from another person bathing (including washing, rinsing, drying)?: A Little Help from another person to put on and taking off regular upper body clothing?: None Help from another person to put on and taking off regular lower body clothing?: A Little 6 Click Score: 20   End of Session Equipment Utilized During  Treatment: Gait belt;Rolling walker;Oxygen(8L vis [place]) Nurse Communication: Mobility status;Other (comment)(increased O2 from 6 to 8L)  Activity Tolerance: Patient limited by fatigue Patient left: in chair;with call bell/phone within reach  OT Visit Diagnosis: Unsteadiness on feet (R26.81);Other abnormalities of gait and mobility (R26.89);Muscle weakness (generalized) (M62.81)                Time: [id number] OT Time Calculation (min): 24 min Charges:  OT General Charges $OT Visit: 1 Visit OT Evaluation $OT Eval Moderate Complexity: 1 Mod OT Treatments $Self Care/Home Management : 8-22 mins G-Codes:     Sherryl MangesLaura Debrah Granderson OTR/L [phone]  Evern BioLaura J Wylene Weissman 05/20/2017, 10:16 AM

## 2017-05-20 NOTE — Progress Notes (Signed)
Name: Jose Flores MRN: [id number] DOB: 02-06-1951    ADMISSION DATE:  05/16/2017 CONSULTATION DATE:  05/17/2017  REFERRING MD :  [facility]  CHIEF COMPLAINT:  COPD/Pulmonary Fibrosis/ BIPAP  BRIEF PATIENT DESCRIPTION:  Adult male supine in bed wearing venti mask, speaking in full sentences with saturation of 100%. Some respiratory accessory muscle use noted. Alert and appropriate. Able to give good history.  SIGNIFICANT EVENTS  04/2017>> 2 admissions [facility] with Acute on chronic respiratory failure  + RSV 2/4>> Admission with A on C Respiratory Failure, RSV negative  STUDIES:  CTA 2/4>>: No evidence of pulmonary embolism. Chronic interstitial lung disease, compatible with the patient's clinical history of pulmonary fibrosis. Superimposed mild emphysematous changes. Mild thoracic lymphadenopathy, likely reactive.  CXR 2/4>> Chronic interstitial lung disease without definite superimposed acute process.  HISTORY OF PRESENT ILLNESS:   Jose LimaRoger D Flores is a 67 y.o. male presenting with shortness of breath that started on 2/2. He is normally on 6L O2  at home  due to chronic ILD and was recently hospitalized twice 04/2017  due to difficulty breathing and was found to have viral URI due to RSV. At home he turned up oxygen to 10L which only helped a little with continued difficulty breathing at rest. After no improvement with increased oxygen requirement [facility] Pt had 3/4 SIRS criteria, but no fever.CT chest showed no focal consolidation but poor baseline lung status could be masking HAP. Patient had elevated d-dimer but CT Angio was negative for PE. BNP was 150.3  PCCM has been asked to consult and assist with care.  Pt. States home oxygen demand [facility] was 3 L at rest and 8 L with exertion. Since [facility]  baseline has been 6 L at rest and max with exertion  Of note, patient acutely lost his hearing  after [facility] in [facility] 2019  SUBJECTIVE: Feels much better this AM, down 6L while at rest (home dose) and 8L with activity.  VITAL SIGNS: Temp:  [97.4 F (36.3 C)-98 F (36.7 C)] 98 F (36.7 C) (02/08 0748) Pulse Rate:  [76-102] 76 (02/08 0748) Resp:  [21-30] 26 (02/08 0748) BP: (110-144)/(70-88) 144/88 (02/08 0748) SpO2:  [88 %-100 %] 93 % (02/08 0748)  PHYSICAL EXAMINATION: General: Chronically ill appearing male but able to hear this AM Neuro:  Alert and interactive, actually able to hear this AM while we are talking to her HEENT:  [place]/AT, PERRL, EOM-I and MMM Cardiovascular: RRR. Nl S1/S2 and -M/R/G. Lungs: Bibasilar crackles Abdomen: Soft, NT, ND and +BS Musculoskeletal: -edema and -tenderness Skin: Intact  Recent Labs  Lab 05/18/17 0219 05/19/17 0233 05/20/17 0227  NA 142 138 135  K 4.5 4.1 3.6  CL 107 102 97*  CO2 25 26 25   BUN 21* 19 32*  CREATININE 0.88 0.84 1.04  GLUCOSE 141* 205* 296*   Recent Labs  Lab 05/18/17 0219 05/19/17 0233 05/20/17 0227  HGB 10.6* 10.6* 11.1*  HCT 32.2* 31.6* 33.3*  WBC 14.6* 11.3* 13.5*  PLT 194 194 206   Dg Chest Port 1 View  Result Date: 05/20/2017 CLINICAL DATA:  Acute respiratory failure EXAM: PORTABLE CHEST 1 VIEW COMPARISON:  05/16/2017 FINDINGS: Cardiac shadow remains enlarged. Diffuse fibrotic changes are again identified bilaterally. No focal infiltrate or sizable effusion is seen. No bony abnormality is noted. IMPRESSION: Chronic changes without acute abnormality. Electronically Signed  By: Alcide Clever M.D.   On: 05/20/2017 07:09   Antibiotics:  Vanc 2/4>> Cefepime 2/4>>  Cultures Blood 2/4>>>NTD RVP>> Negative  I reviewed CXR myself, improvement in aeration but infiltrate diffusely and fibrotic changes noted  ASSESSMENT / PLAN: A: - Acute on Chronic Respiratory Failure 2/2 ILD with recent Viral URI ( RSV +)  January 2019  Requiring  2 hospital admissions ( [place]). - RVP negative 2/4 -  COPD>> Home maintenance Spiriva and Symbicort, Albuterol nebs - Baseline oxygen demand 6L [place], at baseline during rest but continues to need higher with activity (up to 8) - D-Dimer elevated but CTA negative for PE - Requiring BiPAP on admission now on HFNC but titrating down - BNP 150.3 - Likely ILD Flare Discussed with PCCM-NP  - Acute on Chronic Respiratory Failure 2/2 ILD with recent Viral URI ( RSV +)  January 2019  Requiring  2 hospital admissions ( [place]). - RSV negative 2/4 ILD  Flare>> no ILD maintenance to slow progression  P: CPAP while asleep with O2 bled in Solumedrol at 125 mg IV q6 x one more day (total of 3 day) then change to 40 mg IV BID until the day prior to discharge change to prednisone 50 mg PO daily, if no changes with change to 50 mg PO daily then may discharge at that dose and f/u with [facility]. Shelle [facility] in the [facility] for a taper. Titrate O2 for sat of 88-92%, goal is to get to 6L [place] (not HFNC) at rest Will need ambulatory disat study prior to discharge to determine need for O2 with activity (again not with HFNC, regular [place]). Monitor CBG as we increase solumedrol Continue Mucinex BID, with full glass of water Continue Spiriva 18 mcg daily Continue duonebs as ordered Albuterol Nebs prn as ordered PT/OT and OOB Steroid plan as above Will order 2 more doses of lasix 40 mg IV q8 x2 doses with some potassium then can change to home dose  COPD P: Continue spiriva 18 mcg daily Duonebs  BID and prn Albuterol Nebs prn as ordered Follow up with Pulmonary at [facility] ( Has appointment with Dr. Shelle Iron in [place] 05/2017)  [place]cal is not supportive of bacterial super infection but given increase in steroids above would not change abx for now  P: Will d/c vanc and cefepime and change to Augmentin for 3 more days (total abx days of 8) with stop date in place F/u on cultures Trend CBC and Fever Curve CXR to PRN  PCCM will sign off, please call back if needed.  Plan delineated  above.  Alyson Reedy, M.D. [facility] Pulmonary/Critical Care Medicine. Pager: [phone]. After hours pager: [phone].  05/20/2017, 9:19 AM

## 2017-05-20 NOTE — Progress Notes (Signed)
Physical Therapy Treatment Patient Details Name: Jose LimaRoger D Flores MRN: [id number] DOB: [birth year] Today's Date: 05/20/2017    History of Present Illness Pt is a 67 y.o. 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 PMH is significant for HTN, pulmonary fibrosis, COPD (6L home O2).   PT Comments    Pt moving well, but remains limited by cardiorespiratory status and increased O2 needs. SpO2 initially 93% on 6L O2 [place], dropping to 80% when pt getting up to sit EOB. Returning to 92% on 8L O2 [place] with prolonged seated rest break. Down to 71% on 8L O2 [place] with short ambulation distance, returning to 91% with seated rest break and deep breathing. Will follow acutely and progress mobility as pt can tolerate.    Follow Up Recommendations  Home health PT;Supervision/Assistance - 24 hour     Equipment Recommendations  None recommended by PT    Recommendations for Other Services       Precautions / Restrictions Precautions Precautions: Fall Precaution Comments: Watch SpO2 Restrictions Weight Bearing Restrictions: No    Mobility  Bed Mobility Overal bed mobility: Independent             General bed mobility comments: Increased SOB. SpO2 down to 80% on 6L O2 [place] when coming to sit EOB. Increased to 92% on 8L O2 [place] while sitting EOB >5 min with deep breathing  Transfers Overall transfer level: Needs assistance Equipment used: Rolling walker (2 wheeled) Transfers: Sit to/from Stand Sit to Stand: Supervision         General transfer comment: Supervision for safety. Pt able to stand with and without RW  Ambulation/Gait Ambulation/Gait assistance: Supervision Ambulation Distance (Feet): 30 Feet Assistive device: Rolling walker (2 wheeled) Gait Pattern/deviations: Step-through pattern;Decreased stride length Gait velocity: Decreased Gait velocity interpretation: <1.8 ft/sec, indicative of risk for recurrent  falls General Gait Details: Pt moving well with RW and supervision despite significant SOB; this and drop in SpO2 limiting ambulation distance. SpO2 down to 71% on 8L O2 [place], returning to 91% with seated rest break and deep breathing   Stairs            Wheelchair Mobility    Modified Rankin (Stroke Patients Only)       Balance Overall balance assessment: Needs assistance   Sitting balance-Leahy Scale: Good       Standing balance-Leahy Scale: Fair Standing balance comment: Can static stand with no UE support; reliant on UE support for dynamic balance                            Cognition Arousal/Alertness: Awake/alert Behavior During Therapy: WFL for tasks assessed/performed Overall Cognitive Status: Within Functional Limits for tasks assessed                                        Exercises      General Comments        Pertinent Vitals/Pain Pain Assessment: No/denies pain    Home Living                      Prior Function            PT Goals (current goals can now be found in the care plan section) Acute Rehab PT Goals Patient Stated  Goal: Be able to breathe better PT Goal Formulation: With patient Time For Goal Achievement: 06/02/17 Potential to Achieve Goals: Good Progress towards PT goals: Progressing toward goals    Frequency    Min 3X/week      PT Plan Current plan remains appropriate    Co-evaluation              AM-PAC PT "6 Clicks" Daily Activity  Outcome Measure  Difficulty turning over in bed (including adjusting bedclothes, sheets and blankets)?: None Difficulty moving from lying on back to sitting on the side of the bed? : None Difficulty sitting down on and standing up from a chair with arms (e.g., wheelchair, bedside commode, etc,.)?: None Help needed moving to and from a bed to chair (including a wheelchair)?: A Little Help needed walking in hospital room?: A Little Help needed  climbing 3-5 steps with a railing? : A Lot 6 Click Score: 20    End of Session Equipment Utilized During Treatment: Oxygen Activity Tolerance: Patient tolerated treatment well;Treatment limited secondary to medical complications (Comment) Patient left: in chair;with call bell/phone within reach;Other (comment)(with OT) Nurse Communication: Mobility status PT Visit Diagnosis: Other abnormalities of gait and mobility (R26.89);Other (comment)(Increased O2 needs)     Time: 1610-9604 PT Time Calculation (min) (ACUTE ONLY): 15 min  Charges:  $Therapeutic Activity: 8-22 mins                    G Codes:      Ina Homes, PT, DPT Acute Rehab Services  Pager: [phone]  Malachy Chamber 05/20/2017, 8:37 AM

## 2017-05-20 NOTE — Plan of Care (Signed)
Continue current care plan 

## 2017-05-20 NOTE — Progress Notes (Signed)
Family Medicine Teaching Service Daily Progress Note Intern Pager: [phone]  Patient name: Jose Flores Medical record number: [id number] Date of birth: 02/20/1951 Age: 67 y.o. Gender: male  Primary Care Provider: [facility] Consultants: Pulmonology Code Status: Full  Pt Overview and Major Events to Date:  Jose Flores is a 67 y.o. male presenting with SOB after a recent viral illness due to RSV. PMH is significant for HTN, pulmonary fibrosis, COPD, GERD  Assessment and Plan:  Idiopathic pulmonary fibrosis flare: Currently on 8 L nasal cannula, was on 6 L before hospitalization, but had 4 L before 2 recent hospitalizations over the past month. Patient improved yesterday after re-escalating to IV Solu-Medrol and IV cefepime/vancomycin. Pulmonary recommends changing to CPAP at night for OSA, lowering solumedrol after 2 more days since procalcitonin decrease (0.17), no change in antibiotics for now. WBC 13.5, up from 11.3 yesterday. Received Lasix IV 40mg  x2. No LE edema appreciated on exam today. Will need to follow up closely with [facility] Pulmonology outpatient. Intermittently desated to 80s overnight on CPAP, although was unclear what his home settings were. CXR this am no change from previous. Currently on 8L HFNC. -WAT, wean high flow to [place] -Day 3 of steroids      -IV Solu-Medrol 125 mg every 6h (2/5, 2/7-2/8) x1 more days, transition to 40 IV BID, then Prednisone 50mg  daily day prior to discharge, will receive taper outpatient.    -Prednisone 50 mg p.o. (2/6)  - Cont home Spiriva daily -s/p Vanc and Cefepime (2/4, 2/6-2/8), Doxycycline (2/5-2/6). Transition to Augmention today (2/8 - ) x3 days - s/p IV Lasix 40mg  x2, will receive 2 more doses today prior to transitioning to home dose - Guaifenesin  - BCx x 2 NG 48 hours - Pending sputum culture  -Duo nebs as needed -Pulmonary consulted, now signed off -Home Spiriva -Daily CBC, BMP  Hypophosphatemia Mildly low  phosphorus at 2.3>3.2 2/7.  Replete with Phos-Nak - monitor, recheck tomorrow  COPD: Given chronic 4-6L of nasal cannula at home, patient likely needs to be on triple therapy.  We have requested records from the [place] to get correct dosage of home medications. -Respiratory care as listed above  HTN: Chronic.  Mildly hypertensive at 141/84.  Patient endorses taking a medication for his blood pressure but has no medication list and does not know what he takes for sure. - monitor BP  - Awaiting contact from [place] for home meds  PT/OT -PT-recommend home health PT -OT-eval pending  FEN/GI:  Heart healthy diet Prophylaxis: Lovenox  Disposition: Remain stepdown until improved lung condition, likely transfer to floor tomorrow  Subjective:  Patient feels breathing is much improved today from yesterday. Motivated to walk with assistance.    Objective: Temp:  [97.4 F (36.3 C)-98 F (36.7 C)] 98 F (36.7 C) (02/08 0748) Pulse Rate:  [76-102] 76 (02/08 0748) Resp:  [21-30] 26 (02/08 0748) BP: (110-144)/(70-88) 144/88 (02/08 0748) SpO2:  [88 %-100 %] 93 % (02/08 0748) Physical Exam: General: Sitting up in chair, no acute distress Cardiovascular: Regular rate and rhythm, no murmurs or gallops Respiratory:  normal work of breathing, CTAB with minimal bibasilar crackles bilaterally. 8 L nasal cannula Abdomen: Soft nondistended nontender, +BS Extremities: No lower extremity edema 2+ tablets pedis  Laboratory: Recent Labs  Lab 05/18/17 0219 05/19/17 0233 05/20/17 0227  WBC 14.6* 11.3* 13.5*  HGB 10.6* 10.6* 11.1*  HCT 32.2* 31.6* 33.3*  PLT 194 194 206   Recent Labs  Lab  05/16/17 1255  05/18/17 0219 05/19/17 0233 05/20/17 0227  NA 140   < > 142 138 135  K 3.9   < > 4.5 4.1 3.6  CL 102   < > 107 102 97*  CO2 25   < > 25 26 25   BUN 10   < > 21* 19 32*  CREATININE 0.86   < > 0.88 0.84 1.04  CALCIUM 8.7*   < > 8.8* 8.6* 8.9  PROT 6.2*  --   --   --   --   BILITOT 0.9  --   --    --   --   ALKPHOS 69  --   --   --   --   ALT 16*  --   --   --   --   AST 21  --   --   --   --   GLUCOSE 168*   < > 141* 205* 296*   < > = values in this interval not displayed.    Ellwood DenseRumball, Daphna Lafuente, DO 05/20/2017, 9:20 AM PGY-1, [facility] FPTS Intern pager: [phone], text pages welcome

## 2017-05-21 DIAGNOSIS — J9621 Acute and chronic respiratory failure with hypoxia: Secondary | ICD-10-CM

## 2017-05-21 DIAGNOSIS — E440 Moderate protein-calorie malnutrition: Secondary | ICD-10-CM

## 2017-05-21 DIAGNOSIS — I10 Essential (primary) hypertension: Secondary | ICD-10-CM

## 2017-05-21 LAB — CULTURE, BLOOD (ROUTINE X 2)
CULTURE: NO GROWTH
CULTURE: NO GROWTH
Special Requests: ADEQUATE

## 2017-05-21 LAB — CBC
HEMATOCRIT: 34.8 % — AB (ref 39.0–52.0)
HEMOGLOBIN: 11.3 g/dL — AB (ref 13.0–17.0)
MCH: 28.1 pg (ref 26.0–34.0)
MCHC: 32.5 g/dL (ref 30.0–36.0)
MCV: 86.6 fL (ref 78.0–100.0)
Platelets: 205 10*3/uL (ref 150–400)
RBC: 4.02 MIL/uL — AB (ref 4.22–5.81)
RDW: 13.9 % (ref 11.5–15.5)
WBC: 14.6 10*3/uL — ABNORMAL HIGH (ref 4.0–10.5)

## 2017-05-21 LAB — BASIC METABOLIC PANEL
ANION GAP: 11 (ref 5–15)
BUN: 38 mg/dL — ABNORMAL HIGH (ref 6–20)
CALCIUM: 9.1 mg/dL (ref 8.9–10.3)
CO2: 28 mmol/L (ref 22–32)
Chloride: 99 mmol/L — ABNORMAL LOW (ref 101–111)
Creatinine, Ser: 1.04 mg/dL (ref 0.61–1.24)
GLUCOSE: 189 mg/dL — AB (ref 65–99)
POTASSIUM: 4.3 mmol/L (ref 3.5–5.1)
SODIUM: 138 mmol/L (ref 135–145)

## 2017-05-21 LAB — PHOSPHORUS: Phosphorus: 4.1 mg/dL (ref 2.5–4.6)

## 2017-05-21 NOTE — Plan of Care (Signed)
Continue current care plan 

## 2017-05-21 NOTE — Progress Notes (Signed)
Patient Home CPAP setup and placed on with 8L O2 bleed in. Tolerating well with home settings. All wire in good condition, no ware or tear noted

## 2017-05-21 NOTE — Progress Notes (Signed)
Family Medicine Teaching Service Daily Progress Note Intern Pager: [phone]  Patient name: Jose Flores Medical record number: [id number] Date of birth: May 31, 1950 Age: 67 y.o. Gender: male  Primary Care Provider: [facility] Consultants: Pulmonology Code Status: Full  Pt Overview and Major Events to Date:  Jose Flores is a 67 y.o. male presenting with SOB after a recent viral illness due to RSV. PMH is significant for HTN, pulmonary fibrosis, COPD, GERD  Assessment and Plan:  Idiopathic pulmonary fibrosis flare: 7 L HFNC , on 6 L [place] before hospitalization, but had 4 L before 2 recent hospitalizations over the past month.  WBC 13.5 > 14.6.  Per pulmonary recommendations, de-escalation and IV Solu-Medrol as listed below.  Antibiotics de-escalated to Augmentin for 3 days.  Plan to follow-up with [facility] Pulmonology outpatient later in February.  -WAT, wean high flow to [place] -Corticosteroids      -IV Solu-Medrol 125 mg every 6h (2/5, 2/7-2/8) >  IV SOLU-MEDROL 40 MG BID, then Prednisone 50mg  daily day prior to     discharge, will receive taper outpatient.    -Prednisone 50 mg p.o. (2/6)  -Cont home Spiriva daily -Antibiotics     -s/p Vanc and Cefepime (2/4, 2/6-2/8), Doxycycline (2/5-2/6), Augmention (2/8-2/10) x3 days -Pulmonary recommended restarting home Lasix dose, patient denies any Lasix or fluid pills at home - Guaifenesin  - BCx x 2 NG 4 days -Sputum culture negative - Pending sputum culture  -Duo nebs as needed -Pulmonary consulted, now signed off -Daily CBC, BMP  Hypophosphatemia-resolved Mildly low phosphorus at 2.3>3.2>4.1  - monitor, recheck tomorrow  COPD: Given chronic 4-6L of nasal cannula at home, patient likely needs to be on triple therapy.  We have requested records from the [place] to get correct dosage of home medications.  On Spiriva at home -Respiratory care as listed above -[facility] returned showed [facility], did not have medications as  requested.  [facility] today[facility]  OSA CPAP in place  HTN: Chronic.  Mildly hypertensive at 140/77.  patient endorses taking a medication for his blood pressure but has no medication list and does not know what he takes for sure. - monitor BP  - Awaiting contact from [place] for home meds  PT/OT -PT-recommend home health PT with 24-hour supervision -OT-no OT, 24-hour supervision  FEN/GI:  Heart healthy diet Prophylaxis: Lovenox  Disposition: Remain stepdown until improved lung condition, likely transfer to floor tomorrow  Subjective:  Patient feels breathing continue to improve.  Feels a slight much better with the CPAP on.   Objective: Temp:  [97.7 F (36.5 C)-98.4 F (36.9 C)] 97.7 F (36.5 C) (02/09 0744) Pulse Rate:  [69-116] 69 (02/09 0744) Resp:  [22-35] 22 (02/09 0744) BP: (131-153)/(69-92) 148/77 (02/09 0744) SpO2:  [91 %-98 %] 98 % (02/09 0744) Physical Exam: General: Sitting up in chair, no acute distress Cardiovascular: Regular rate and rhythm, no murmurs or gallops Respiratory:  normal work of breathing, CTAB with minimal bibasilar crackles bilaterally. 7 L HF [place] abdomen: Soft nondistended nontender, +BS Extremities: No lower extremity edema 2+ tablets pedis  Laboratory: Recent Labs  Lab 05/19/17 0233 05/20/17 0227 05/21/17 0312  WBC 11.3* 13.5* 14.6*  HGB 10.6* 11.1* 11.3*  HCT 31.6* 33.3* 34.8*  PLT 194 206 205   Recent Labs  Lab 05/16/17 1255  05/19/17 0233 05/20/17 0227 05/21/17 0312  NA 140   < > 138 135 138  K 3.9   < > 4.1  3.6 4.3  CL 102   < > 102 97* 99*  CO2 25   < > 26 25 28   BUN 10   < > 19 32* 38*  CREATININE 0.86   < > 0.84 1.04 1.04  CALCIUM 8.7*   < > 8.6* 8.9 9.1  PROT 6.2*  --   --   --   --   BILITOT 0.9  --   --   --   --   ALKPHOS 69  --   --   --   --   ALT 16*  --   --   --   --   AST 21  --   --   --   --   GLUCOSE 168*   < > 205* 296* 189*   < > = values in this interval not displayed.    Jose Flores,  Jose Flores B, MD 05/21/2017, 8:48 AM PGY-1, [facility] FPTS Intern pager: [phone], text pages welcome

## 2017-05-21 NOTE — Progress Notes (Signed)
Patient placed on home CPAP with 6L 02 bleed in. Tolerating well. No issues and patient will call if any further assistance needed.

## 2017-05-21 NOTE — Progress Notes (Addendum)
[facility].  [facility]  [facility] requested [facility] release authorization be refaxed.  It was.  Will follow up to see if records come today.

## 2017-05-22 LAB — GLUCOSE, CAPILLARY
Glucose-Capillary: 263 mg/dL — ABNORMAL HIGH (ref 65–99)
Glucose-Capillary: 268 mg/dL — ABNORMAL HIGH (ref 65–99)
Glucose-Capillary: 148 mg/dL — ABNORMAL HIGH (ref 65–99)

## 2017-05-22 LAB — BASIC METABOLIC PANEL
ANION GAP: 12 (ref 5–15)
BUN: 34 mg/dL — ABNORMAL HIGH (ref 6–20)
CHLORIDE: 100 mmol/L — AB (ref 101–111)
CO2: 25 mmol/L (ref 22–32)
CREATININE: 0.81 mg/dL (ref 0.61–1.24)
Calcium: 8.5 mg/dL — ABNORMAL LOW (ref 8.9–10.3)
GFR calc non Af Amer: >60 mL/min (ref >60)
Glucose, Bld: 268 mg/dL — ABNORMAL HIGH (ref 65–99)
POTASSIUM: 4.4 mmol/L (ref 3.5–5.1)
SODIUM: 137 mmol/L (ref 135–145)

## 2017-05-22 LAB — CULTURE, RESPIRATORY: CULTURE: NORMAL

## 2017-05-22 LAB — CBC
HCT: 35.2 % — ABNORMAL LOW (ref 39.0–52.0)
HEMOGLOBIN: 11.3 g/dL — AB (ref 13.0–17.0)
MCH: 28.4 pg (ref 26.0–34.0)
MCHC: 32.1 g/dL (ref 30.0–36.0)
MCV: 88.4 fL (ref 78.0–100.0)
PLATELETS: 186 10*3/uL (ref 150–400)
RBC: 3.98 MIL/uL — AB (ref 4.22–5.81)
RDW: 14.2 % (ref 11.5–15.5)
WBC: 12.7 10*3/uL — ABNORMAL HIGH (ref 4.0–10.5)

## 2017-05-22 LAB — HEMOGLOBIN A1C
Hgb A1c MFr Bld: 5.8 % — ABNORMAL HIGH (ref 4.8–5.6)
Mean Plasma Glucose: 119.76 mg/dL

## 2017-05-22 LAB — CULTURE, RESPIRATORY W GRAM STAIN

## 2017-05-22 MED ORDER — INSULIN ASPART 100 UNIT/ML ~~LOC~~ SOLN: 0.0000 [IU] | Freq: Every day | SUBCUTANEOUS | Status: DC

## 2017-05-22 MED ORDER — INSULIN ASPART 100 UNIT/ML ~~LOC~~ SOLN
0.0000 [IU] | Freq: Three times a day (TID) | SUBCUTANEOUS | Status: DC
  Administered 2017-05-22: 5 [IU] via SUBCUTANEOUS
  Administered 2017-05-23: 1 [IU] via SUBCUTANEOUS

## 2017-05-22 NOTE — Progress Notes (Signed)
Family Medicine Teaching Service Daily Progress Note Intern Pager: [phone]  Patient name: Jose Flores Medical record number: [id number] Date of birth: 01/30/1951 Age: 67 y.o. Gender: male  [facility] Consultants: Pulmonology Code Status: Full  Pt Overview and Major Events to Date:  Jose Flores is a 67 y.o. male presenting with SOB after a recent viral illness due to RSV. PMH is significant for HTN, pulmonary fibrosis, COPD, GERD  Assessment and Plan:  Idiopathic pulmonary fibrosis flare: Improved  He is afebrile and hemodynamically stable.  He is up sitting in bed in feels that his breathing is getting better.  Patient saturating well on 6 L HFNC, he says is on 4-6 L HIGH FLOW AT HOME.  However, he is unsure of any of his medications and we [place] not [place].     WBC 14.6 > 12.7.  Per pulmonary recommendations, de-escalation and IV Solu-Medrol as listed below.  Antibiotics de-escalated to Augmentin for 3 days.  [facility] in [facility].  -WAT, wean high flow to [place] -Corticosteroids      -Steroids         -IV Solu-Medrol 125  (2/5, 2/7-2/8)         -IV SOLU-MEDROL 40 MG BID, then Prednisone 50mg  daily day prior to discharge, with taper outpatient.        -Prednisone 50 mg p.o. (2/6)  -Cont home Spiriva 18mcg daily -Antibiotics     -s/p Vanc and Cefepime (2/4, 2/6-2/8), Doxycycline (2/5-2/6), Augmention (2/8-2/10) x3 days - Guaifenesin  - BCx x 2 NG 4 days -Sputum culture negative - Respiratory culutre (non-expectorant) Abundant G+ cocci, few G- rod, few G- coccobacilli, rare WBC (PMN), few squamous cells  -Duo nebs as needed -Pulmonary consulted, now signed off -Daily CBC, BMP -Ambulate with pulse ox, if improved, de-escalate steroids to oral prednisone as above  Hyperglycemia No known history of diabetesPatient has had hyperglycemia with sugars ranging  between 142-296 this hospitalization.  Today's is 268.  Likely elevated in the setting of steroids.  -Start sensitive sliding scale as patient is insulin nave -Every 4 CBG checks  COPD: Given chronic 4-6L of nasal cannula at home, patient likely needs to be on triple therapy.  [place] medications.  On Spiriva at home -Respiratory care as listed above -[facility] fax returned showed advanced directive, did not have medications as requested.  Will [facility] to contact [facility] again today.  OSA CPAP in place  HTN: Chronic.  Normotensive at this moment..  patient endorses taking a medication for his blood pressure but has no medication list and does not know what he takes for sure. - monitor BP  - [place]  PT/OT -PT-recommend home health PT with 24-hour supervision -OT-no OT, 24-hour supervision  FEN/GI:  Heart healthy diet Prophylaxis: Lovenox  Disposition: transfer to floor   Subjective:  Patient feels breathing continue to improve.  Said he slept very well with CPAP last night.  Denies any pain  Objective: Temp:  [97.7 F (36.5 C)-98.4 F (36.9 C)] 98.2 F (36.8 C) (02/10 0348) Pulse Rate:  [69-104] 70 (02/10 0400) Resp:  [19-29] 24 (02/10 0400) BP: (137-153)/(75-84) 137/79 (02/10 0348) SpO2:  [78 %-100 %] 96 % (02/10 0400) Physical Exam: General: Sitting up in chair, no acute distress Cardiovascular: Regular rate  and rhythm, no murmurs or gallops Respiratory:  normal work of breathing, CTAB with minimal bibasilar crackles bilaterally. 6 L HF [place]  Abdomen: Soft nondistended nontender, +BS Extremities: No lower extremity edema 2+ tablets pedis  Laboratory: Recent Labs  Lab 05/20/17 0227 05/21/17 0312 05/22/17 0316  WBC 13.5* 14.6* 12.7*  HGB 11.1* 11.3* 11.3*  HCT 33.3* 34.8* 35.2*  PLT 206 205 186   Recent Labs  Lab 05/16/17 1255  05/20/17 0227 05/21/17 0312 05/22/17 0316  NA 140   < >  135 138 137  K 3.9   < > 3.6 4.3 4.4  CL 102   < > 97* 99* 100*  CO2 25   < > 25 28 25   BUN 10   < > 32* 38* 34*  CREATININE 0.86   < > 1.04 1.04 0.81  CALCIUM 8.7*   < > 8.9 9.1 8.5*  PROT 6.2*  --   --   --   --   BILITOT 0.9  --   --   --   --   ALKPHOS 69  --   --   --   --   ALT 16*  --   --   --   --   AST 21  --   --   --   --   GLUCOSE 168*   < > 296* 189* 268*   < > = values in this interval not displayed.    Garnette Gunner, MD 05/22/2017, 7:08 AM PGY-1, [facility] FPTS Intern pager: [phone], text pages welcome

## 2017-05-22 NOTE — Progress Notes (Signed)
Patient has home CPAP unit at bedside. Patient placed self on and is able to take off when ready.

## 2017-05-22 NOTE — Progress Notes (Signed)
Pt ambulated in hall on 10L of o2 approx 7840ft, pt had steady decline in o2 sats down to uppwer 70s before we stopped to rest, recovery time approx 3/4 mins, then ambulated an addition 40 ft back to pts room on 15L pt tolerated better with only desats into the lower 80s and recovery time was much faster, upon return to bed patient placed on 8L and recovered back into mids 90s in approx 2 mins time. Prior to leaving pts room oxygen turned back down to 6L high flow,

## 2017-05-23 ENCOUNTER — Other Ambulatory Visit: Payer: Self-pay

## 2017-05-23 LAB — CBC
HCT: 35.7 % — ABNORMAL LOW (ref 39.0–52.0)
Hemoglobin: 11.4 g/dL — ABNORMAL LOW (ref 13.0–17.0)
MCH: 28.2 pg (ref 26.0–34.0)
MCHC: 31.9 g/dL (ref 30.0–36.0)
MCV: 88.4 fL (ref 78.0–100.0)
PLATELETS: 171 10*3/uL (ref 150–400)
RBC: 4.04 MIL/uL — AB (ref 4.22–5.81)
RDW: 14.2 % (ref 11.5–15.5)
WBC: 12.3 10*3/uL — ABNORMAL HIGH (ref 4.0–10.5)

## 2017-05-23 LAB — GLUCOSE, CAPILLARY
GLUCOSE-CAPILLARY: 139 mg/dL — AB (ref 65–99)
GLUCOSE-CAPILLARY: 144 mg/dL — AB (ref 65–99)
Glucose-Capillary: 134 mg/dL — ABNORMAL HIGH (ref 65–99)

## 2017-05-23 LAB — BASIC METABOLIC PANEL
Anion gap: 11 (ref 5–15)
BUN: 25 mg/dL — AB (ref 6–20)
CALCIUM: 8.7 mg/dL — AB (ref 8.9–10.3)
CO2: 28 mmol/L (ref 22–32)
Chloride: 98 mmol/L — ABNORMAL LOW (ref 101–111)
Creatinine, Ser: 0.86 mg/dL (ref 0.61–1.24)
Glucose, Bld: 190 mg/dL — ABNORMAL HIGH (ref 65–99)
Potassium: 5.1 mmol/L (ref 3.5–5.1)
Sodium: 137 mmol/L (ref 135–145)

## 2017-05-23 MED ORDER — PREDNISONE 50 MG PO TABS: 50.0000 mg | ORAL_TABLET | Freq: Every day | ORAL | Status: DC

## 2017-05-23 MED ORDER — PREDNISONE 50 MG PO TABS
50.0000 mg | ORAL_TABLET | Freq: Every day | ORAL | Status: DC
  Filled 2017-05-23: qty 1

## 2017-05-23 MED ORDER — AMOXICILLIN-POT CLAVULANATE 875-125 MG PO TABS: 1.0000 | ORAL_TABLET | Freq: Two times a day (BID) | ORAL | Status: DC

## 2017-05-23 MED ORDER — PREDNISONE 50 MG PO TABS: 50.0000 mg | ORAL_TABLET | Freq: Every day | ORAL | 0 refills | Status: DC

## 2017-05-23 NOTE — Progress Notes (Signed)
Family Medicine Teaching Service Daily Progress Note Intern Pager: [phone]  Patient name: Jose Flores Medical record number: [id number] Date of birth: 1950/10/22 Age: 67 y.o. Gender: male  Primary Care Provider: [facility] Consultants: Pulmonology Code Status: Full  Pt Overview and Major Events to Date:  Jose Flores is a 67 y.o. male presenting with SOB after a recent viral illness due to RSV. PMH is significant for HTN, pulmonary fibrosis, COPD, GERD  Assessment and Plan:  Idiopathic pulmonary fibrosis flare: Improved  Yesterday afternoon he was ambulated, desatted to low 80s even with 15 L high flow.  However patient feels that he was actually doing better at this time while ambulating, and the distance that he walks down the hall is nearly double what he does at home.  Patient saturating well on 6 L HFNC at rest in bed.  He uses 4-6 L HIGH FLOW AT HOME at rest and at 10 L while ambulating.  White blood cell count 12.3.  Currently on IV Solu-Medrol 40 mg twice daily.  He is now completed  Augmentin for 3 day course.  Plan to follow-up with [facility] Pulmonology outpatient later in February.  -Ambulate with pulse ox, if tolerates switch to oral prednisone twice daily with outpatient taper -WAT, wean high flow to [place] -Corticosteroids      -Steroids         -IV Solu-Medrol 125  (2/5, 2/7-2/8)         -IV SOLU-MEDROL 40 MG BID        -Prednisone 50 mg p.o. (2/6)  -Cont home Spiriva daily -Antibiotics     -s/p Vanc and Cefepime (2/4, 2/6-2/8), Doxycycline (2/5-2/6), Augmention (2/8-2/10)  - Guaifenesin  - BCx x 2 NG 5 days - Respiratory culutre (non-expectorant) Abundant G+ cocci, few G- rod, few G- coccobacilli, rare WBC (PMN), few squamous cells  -Duo nebs as needed -Daily CBC, BMP  Hyperglycemia due to steroids Last A1c 5.8 on 2/9.  No known history of diabetes.  Patient has had hyperglycemia with sugars ranging between 142-296 this hospitalization.  CBG past 24  hours of been 140 through 260.  Most recently 139.  likely elevated in the setting of steroids.  -Sliding-scale insulin sensitive, received 5 units aspart on 2/10 -Every 4 CBG checks  COPD: Given chronic 4-6L of nasal cannula at home, patient likely needs to be on triple therapy.  We have requested records from the [place] to get correct dosage of home medications.  On Spiriva at home -Respiratory care as listed above -[facility] fax returned showed advanced directive, did not have medications as requested.  Will [facility] contact [facility] again today.  OSA CPAP in place  HTN: Chronic.  Normotensive at this moment..  patient endorses taking a medication for his blood pressure but has no medication list and does not know what he takes for sure. - monitor BP  - Awaiting contact from [place] for home meds  PT/OT -PT-recommend home health PT with 24-hour supervision -OT-no OT, 24-hour supervision  FEN/GI:  Heart healthy diet Prophylaxis: Lovenox  Disposition: Inpatient management pending improved respiratory function, possibly home today if tolerates ambulation well  Subjective:  Patient feels his breathing is better.  Wants to go home today.  Denies any chest pain.  Objective: Temp:  [97.6 F (36.4 C)-98.6 F (37 C)] 97.6 F (36.4 C) (02/11 0737) Pulse Rate:  [65-88] 70 (02/11 0737) Resp:  [17-25] 23 (02/11 0737) BP: (120-157)/(63-90) 131/90 (02/11 0737) SpO2:  [91 %-  97 %] 96 % (02/11 0745) Physical Exam: General: Sitting up in chair, no acute distress Cardiovascular: Regular rate and rhythm, no murmurs or gallops Respiratory:  normal work of breathing, CTAB with minimal bibasilar crackles bilaterally. 6 L HF [place]  Abdomen: Soft nondistended nontender, +BS Extremities: No lower extremity edema 2+ tablets pedis  Laboratory: Recent Labs  Lab 05/21/17 0312 05/22/17 0316 05/23/17 0333  WBC 14.6* 12.7* 12.3*  HGB 11.3* 11.3* 11.4*  HCT 34.8* 35.2* 35.7*  PLT 205 186 171   Recent Labs  Lab  05/16/17 1255  05/21/17 0312 05/22/17 0316 05/23/17 0333  NA 140   < > 138 137 137  K 3.9   < > 4.3 4.4 5.1  CL 102   < > 99* 100* 98*  CO2 25   < > 28 25 28   BUN 10   < > 38* 34* 25*  CREATININE 0.86   < > 1.04 0.81 0.86  CALCIUM 8.7*   < > 9.1 8.5* 8.7*  PROT 6.2*  --   --   --   --   BILITOT 0.9  --   --   --   --   ALKPHOS 69  --   --   --   --   ALT 16*  --   --   --   --   AST 21  --   --   --   --   GLUCOSE 168*   < > 189* 268* 190*   < > = values in this interval not displayed.    [name], [name], MD 05/23/2017, 8:49 AM PGY-1, [facility] FPTS Intern pager: [phone], text pages welcome

## 2017-05-23 NOTE — Progress Notes (Cosign Needed)
Family Medicine Teaching Service Daily Progress Note Intern Pager: [phone]  Patient name: Jose Flores Medical record number: [id number] Date of birth: 1950-11-23 Age: 67 y.o. Gender: male  [facility] Consultants: Pulmonology Code Status: Full  Pt Overview and Major Events to Date:  Jose LimaRoger D Legore is a 67 y.o. male presenting with SOB after a recent viral illness due to RSV. PMH is significant for HTN, pulmonary fibrosis, COPD, GERD  Assessment and Plan:  Idiopathic pulmonary fibrosis flare: Improved  He is afebrile and hemodynamically stable.  He is up sitting in bed in feels that his breathing is getting better.  Patient saturating well on 6 L HFNC, he says is on 4-6 L HIGH FLOW AT HOME.  However, he is unsure of any of his medications and we [place] not [place] of [place].     WBC 14.6 > 12.7.  Per pulmonary recommendations, de-escalation and IV Solu-Medrol as listed below.  Antibiotics de-escalated to Augmentin for 3 days.  Will stop IV steroids start oral prednisone.  If patient tolerates this likely home today.  Per pulmonology, continue patient on oral prednisone 50 until he is seen by his outpatient pulmonologist [facility].  -WAT, wean high flow to [place] -Corticosteroids      -Steroids         -IV Solu-Medrol 125  (2/5, 2/7-2/8)         -IV SOLU-MEDROL 40 MG BID, then Prednisone 50mg  daily day prior to discharge, with taper outpatient.        -Prednisone 50 mg p.o. (2/6, 2/11-)  -Cont home Spiriva 18mcg daily -Antibiotics     -s/p Vanc and Cefepime (2/4, 2/6-2/8), Doxycycline (2/5-2/6), Augmention (2/8-2/10) x3 days - Guaifenesin  - BCx x 2 NG 4 days -Sputum culture negative - Respiratory culutre (non-expectorant) Abundant G+ cocci, few G- rod, few G- coccobacilli, rare WBC (PMN), few squamous cells  -Duo nebs as needed -Pulmonary consulted, now signed  off -Daily CBC, BMP -Ambulate with pulse ox, if improved, de-escalate steroids to oral prednisone as above  Hyperglycemia No known history of diabetesPatient has had hyperglycemia with sugars ranging between 142-296 this hospitalization.  Today's is 268.  Likely elevated in the setting of steroids.  -Start sensitive sliding scale as patient is insulin nave -Every 4 CBG checks  COPD: Given chronic 4-6L of nasal cannula at home, patient likely needs to be on triple therapy.  [place] of [place] medications.  On Spiriva at home -Respiratory care as listed above -[facility] fax returned showed advanced directive, did not have medications as requested.  Will [facility] contact [facility] again today.  OSA CPAP in place  HTN: Chronic.  Normotensive at this moment..  patient endorses taking a medication for his blood pressure but has no medication list and does not know what he takes for sure. - monitor BP  - [place]  PT/OT -PT-recommend home health PT with 24-hour supervision -OT-no OT, 24-hour supervision  FEN/GI:  Heart healthy diet Prophylaxis: Lovenox  Disposition: Likely home today  Subjective:  Patient feels breathing continue to improve.  Said he slept very well with CPAP last night.  Denies any pain  Objective: Temp:  [97.6 F (36.4 C)-98.6 F (37 C)] 98.6 F (37 C) (02/11 0430) Pulse Rate:  [65-88] 65 (02/11 0430) Resp:  [  17-25] 19 (02/11 0430) BP: (91-157)/(63-88) 156/78 (02/11 0430) SpO2:  [91 %-98 %] 92 % (02/11 0430) Physical Exam: General: Sitting up in chair, no acute distress Cardiovascular: Regular rate and rhythm, no murmurs or gallops Respiratory:  normal work of breathing, CTAB with minimal bibasilar crackles bilaterally. 6 L HF [place]  Abdomen: Soft nondistended nontender, +BS Extremities: No lower extremity edema 2+ tablets pedis  Laboratory: Recent Labs  Lab 05/21/17 0312 05/22/17 0316  05/23/17 0333  WBC 14.6* 12.7* 12.3*  HGB 11.3* 11.3* 11.4*  HCT 34.8* 35.2* 35.7*  PLT 205 186 171   Recent Labs  Lab 05/16/17 1255  05/21/17 0312 05/22/17 0316 05/23/17 0333  NA 140   < > 138 137 137  K 3.9   < > 4.3 4.4 5.1  CL 102   < > 99* 100* 98*  CO2 25   < > 28 25 28   BUN 10   < > 38* 34* 25*  CREATININE 0.86   < > 1.04 0.81 0.86  CALCIUM 8.7*   < > 9.1 8.5* 8.7*  PROT 6.2*  --   --   --   --   BILITOT 0.9  --   --   --   --   ALKPHOS 69  --   --   --   --   ALT 16*  --   --   --   --   AST 21  --   --   --   --   GLUCOSE 168*   < > 189* 268* 190*   < > = values in this interval not displayed.    Garnette Gunner, MD 05/23/2017, 7:26 AM PGY-1, [facility] Family Medicine FPTS Intern pager: [phone], text pages welcome

## 2017-05-23 NOTE — Progress Notes (Signed)
Pt discharged to home with wife, prescription for prednisone given to patient with one dose for tomorrow morning from our pyxis, as pt stated he would not have time to get the prescription filled before tomorrow AM when dose is due. All discharge instructions provided to pt who voiced understanding. Pt [facility]. No questions or concerns at this time. Pt stable to d.c.

## 2017-05-23 NOTE — Progress Notes (Addendum)
Physical Therapy Treatment Patient Details Name: Jose Flores MRN: [id number] DOB: 03/05/1951 Today's Date: 05/23/2017    History of Present Illness Pt is a 67 y.o. 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 PMH is significant for HTN, pulmonary fibrosis, COPD (6L home O2).   PT Comments    Pt declining OOB mobility. Very anxious to return home asap. Today's session focused on education in preparation for potential discharge home today, including energy conservation and fall risk reduction. Pt has all necessary DME and wife will be available for 24/7 support; still interested in working with HHPT services. If pt remains admitted, will continue to follow acutely.   Follow Up Recommendations  Home health PT;Supervision/Assistance - 24 hour     Equipment Recommendations  None recommended by PT    Recommendations for Other Services       Precautions / Restrictions Precautions Precautions: Fall Precaution Comments: Watch SpO2 Restrictions Weight Bearing Restrictions: No    Mobility  Bed Mobility Overal bed mobility: Independent             General bed mobility comments: Pt declining ambulation or transfer to chair having already walked with RN. Focused today's session on education with pt likely discharging home today  Transfers                    Ambulation/Gait                 Stairs            Wheelchair Mobility    Modified Rankin (Stroke Patients Only)       Balance                                            Cognition Arousal/Alertness: Awake/alert Behavior During Therapy: WFL for tasks assessed/performed Overall Cognitive Status: Within Functional Limits for tasks assessed                                        Exercises      General Comments        Pertinent Vitals/Pain Pain Assessment: No/denies pain     Home Living                      Prior Function            PT Goals (current goals can now be found in the care plan section) Acute Rehab PT Goals Patient Stated Goal: Be able to breathe better PT Goal Formulation: With patient Time For Goal Achievement: 06/02/17 Potential to Achieve Goals: Good Progress towards PT goals: Progressing toward goals    Frequency    Min 3X/week      PT Plan Current plan remains appropriate    Co-evaluation              AM-PAC PT "6 Clicks" Daily Activity  Outcome Measure  Difficulty turning over in bed (including adjusting bedclothes, sheets and blankets)?: None Difficulty moving from lying on back to sitting on the side of the bed? : None Difficulty sitting down on and standing up from a chair with arms (e.g., wheelchair, bedside commode, etc,.)?: None Help needed moving to and from  a bed to chair (including a wheelchair)?: A Little Help needed walking in hospital room?: A Little Help needed climbing 3-5 steps with a railing? : A Little 6 Click Score: 21    End of Session Equipment Utilized During Treatment: Oxygen Activity Tolerance: Patient tolerated treatment well Patient left: in bed;with call bell/phone within reach Nurse Communication: Mobility status PT Visit Diagnosis: Other abnormalities of gait and mobility (R26.89);Other (comment)(Increased O2 needs)     Time: [id number] PT Time Calculation (min) (ACUTE ONLY): 11 min  Charges:  $Self Care/Home Management: 8-22                    G Codes:      Ina HomesJaclyn Gerrald Basu, PT, DPT Acute Rehab Services  Pager: [phone]  Malachy ChamberJaclyn L Emonnie Cannady 05/23/2017, 11:48 AM

## 2017-05-23 NOTE — Progress Notes (Addendum)
Pt ambulated approx 35 ft on 10L o2, pt slowly desaturated to low 80s, towards the end of the 35 ft pt stopped (standing) to take a few deep breaths to recover before walking an additional 35 back to room, again with o2 sats in lower to mid 80s. (Lowest being 79) Pt returned to bed and placed on 6L to recover and within 3/4 mins, o2 sats were 94/95% on 6L high flow, pt verbilized that he did not feel as winded as prior with this walk and that even with o2 sats in the low 80s he felt as they were comparable to how he feels when they are in the mid 80s.

## 2017-05-23 NOTE — Progress Notes (Addendum)
Spoke with Dr. Molli KnockYacoub with pulmonology regarding patient's care.  He agrees with stopping Solu-Medrol and starting oral prednisone.  Given the patient is at baseline function right now, he says the patient can go home today continuing on oral prednisone until he is seen by his outpatient pulmonologist who will likely initiate steroid taper.  Spoke with patient by phone as well. Patient wishes to go home today.  PT had recommended home health PT with 24-hour supervision.  Patient has wife with him at all times and feels comfortable with this.  Discussed the risk of home health PT the patient may face at home given his severe respiratory disease.  He still feels comfortable with going home.  Plan for likely discharge later this afternoon.

## 2017-05-23 NOTE — Progress Notes (Signed)
Occupational Therapy Treatment and Discharge. Patient Details Name: Jose Flores MRN: [id number] DOB: 09-07-1950 Today's Date: 05/23/2017    History of present illness Pt is a 67 y.o. 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 PMH is significant for HTN, pulmonary fibrosis, COPD (6L home O2).   OT comments  Educated pt in energy conservation strategies and breathing techniques with pt verbalizing understanding. Pt is performing at a modified independent level in self care. No further OT need.  Follow Up Recommendations  No OT follow up    Equipment Recommendations       Recommendations for Other Services      Precautions / Restrictions Precautions Precautions: Fall Precaution Comments: Watch SpO2 Restrictions Weight Bearing Restrictions: No       Mobility Bed Mobility Overal bed mobility: Independent               Transfers Overall transfer level: Modified independent                    Balance     Sitting balance-Leahy Scale: Good       Standing balance-Leahy Scale: Good                             ADL either performed or assessed with clinical judgement   ADL                                         General ADL Comments: Educated in energy conservation strategies and pursed lip breathing. Pt performed toileting and standing grooming modified independently.     Vision       Perception     Praxis      Cognition Arousal/Alertness: Awake/alert Behavior During Therapy: WFL for tasks assessed/performed Overall Cognitive Status: Within Functional Limits for tasks assessed                                          Exercises     Shoulder Instructions       General Comments      Pertinent Vitals/ Pain       Pain Assessment: No/denies pain  Home Living                                          Prior Functioning/Environment              Frequency           Progress Toward Goals  OT Goals(current goals can now be found in the care plan section)  Progress towards OT goals: Goals met/education completed, patient discharged from OT  Acute Rehab OT Goals Patient Stated Goal: to go home today  Plan All goals met and education completed, patient discharged from OT services    Co-evaluation                 AM-PAC PT "6 Clicks" Daily Activity     Outcome Measure   Help from another person eating meals?: None Help from another person taking care of personal grooming?: None Help from another  person toileting, which includes using toliet, bedpan, or urinal?: None Help from another person bathing (including washing, rinsing, drying)?: None Help from another person to put on and taking off regular upper body clothing?: None Help from another person to put on and taking off regular lower body clothing?: None 6 Click Score: 24    End of Session Equipment Utilized During Treatment: Oxygen;Gait belt(6L)  OT Visit Diagnosis: Other (comment)(decreased activity tolerance)   Activity Tolerance Patient tolerated treatment well   Patient Left in bed;with call bell/phone within reach   Nurse Communication          Time: 1610-9604 OT Time Calculation (min): 15 min  Charges: OT General Charges $OT Visit: 1 Visit OT Treatments $Self Care/Home Management : 8-22 mins  05/23/2017 Nestor Lewandowsky, OTR/L Pager: [place], Haze Boyden 05/23/2017, 11:51 AM

## 2017-05-23 NOTE — Discharge Instructions (Addendum)
You were admitted to the hospital due to respiratory distress from an idiopathic pulmonary fibrosis flare.    In the hospital you received antibiotic therapy and steroids.  You finished your course of antibiotics in the hospital but need to continue taking prednisone 50mg  daily until you see your pulmonologist as we anticipate that you will need a taper down.  Come back to the hospital if you develop any increased work of breathing but does not improve with oxygen or with your albuterol, temperature greater than 100.4, severe coughing, chest pain.  There could be some changes made to your home medications during this hospitalization. Please, make sure to read the directions before you take them. The names and directions on how to take these medications are found on this discharge paper under medication section.  Please follow up at your primary care doctor's office. Please call and make a hospital follow up appointment to be seen within the next 1 week.

## 2017-05-23 NOTE — Care Management Note (Addendum)
Case Management Note  Patient Details  Name: Jose LimaRoger D Hogan MRN: [id number] Date of Birth: 08/24/1950  Subjective/Objective:    PT admitted with COPD excerbation                Action/Plan:   PTA independent from home.  Pt is on supplemental oxygen 6-10 liters continuous via [facility] benefit.  Pt has liquid oxygen concentrator to accommodate higher oxygen needs.  Pt has required oxygen for transport home.  Pt states he has notified [facility] that he has been admitted.  CM also contacted [facility] and informed of admit.  [facility]  CM offered [facility] of HH and [facility] and referral accepted   Expected Discharge Date:                  Expected Discharge Plan:  Home w Home Health Services  In-House Referral:     Discharge planning Services  CM Consult  Post Acute Care Choice:    Choice offered to:  Patient  DME Arranged:  Bedside commode DME Agency:  [facility]  HH Arranged[facility]  [facility]  [facility]  Status of Service:  In process, will continue to follow  If discussed at Long Length of Stay Meetings, dates discussed:    Additional Comments: 05/23/2017  Per Attending -pt will discharge home on 10 liters with exertion (baseline needs when exerting PTA).  Pt states he has liquid oxygen tank that holds 70, he also has smaller tanks that have the ability to titrate up to 15 liters.   [place] [facility] [place] - [facility] scheduled to refill tanks this week.  Pt also informed CM that he has ample portable tanks for transport home.  Pts wife will provide recommended supervision  CM received [place] back from [facility].  Pt is not service connected however does receive home hospice benefits [place] the [place].  [facility].  VA is not asking for refusal to transfer form at this time.   Cherylann ParrClaxton, Merrilee Ancona S, RN 05/23/2017, 8:59 AM

## 2017-05-25 NOTE — Discharge Summary (Signed)
[facility] Discharge Summary  Patient name: Jose Flores Medical record number: [id number] Date of birth: 1950-05-11 Age: 67 y.o. Gender: male Date of Admission: 05/16/2017  Date of Discharge: 05/23/2017 Admitting Physician: Leeanne Rio, MD  Primary Care Provider: [facility] Consultants: Pulmonology  Indication for Hospitalization: Idiopathic pulmonary fibrosis flare  Discharge Diagnoses/Problem List:  HTN Idiopathicpulmonary fibrosis COPD GERD Hyperglycemia due to corticosteroids OSA  Disposition: Home with home health PT 24-hour supervision from wife  Discharge Condition: Improved  Discharge Exam:  General: Sitting up in chair, no acute distress Cardiovascular: Regular rate and rhythm, no murmurs or gallops Respiratory:  normal work of breathing, CTAB with minimal bibasilar crackles bilaterally. 6 L HF [place]  Abdomen: Soft nondistended nontender, +BS Extremities: No lower extremity edema 2+ tablets pedis  Brief Hospital Course:  Jose Flores is a 67 y.o. male who presented with respiratory distress secondary to an idiopathic pulmonary fibrosis flare.   Patient required BiPAP initially to maintain adequate oxygen saturation.  Upon admission CBC and CMP were unremarkable.  Patient had CT chest that showed chronic interstitial lung disease without evidence of pneumonia.  RVP and blood cultures negative for infection. There was one respiratory stain that showed abundant G+ cocci, few G- rod, few G- coccobacilli, rare WBC (PMN), few squamous cells.  However it was uncertain of the clinical significance of this. Patient was started on broad-spectrum antibiotics for Zosyn and cefepime and IV Solu-Medrol 125 mg IV 3 times a day.  Pro-calcitonin levels did not support initiation of starting antibiotics but given patient's increased respiratory need pulmonology was consulted.  Pulmonology recommended continuation of IV steroids and  broad-spectrum antibiotics until lung function improved.  Patient was weaned off of BiPAP after one evening, and was started on 15 L high flow and gradually weaned to his baseline of 6 L at rest and bed.  Patient steroid course during hospitalization is as follows IV Solu-Medrol 125 mg for 2 days, IV Solu-Medrol 40 mg BID daily for 4 days, oral prednisone on day of discharge. In total patient was on 8 days of IV steroids.  Patient's antibiotic course is as follows: IV cefepime and vancomycin for 5 days, oral Augmentin for 3 days, total of 8 days of antibiotics.  Patient was instructed to continue oral prednisone 50 mg daily until he was seen by pulmonology where he had follow-up on February 22.  They are the patient could initiate taper with discussions with his pulmonologist in outpatient setting.  Issues for Follow Up:  1. Patient was exhibiting hypoglycemia secondary to significant steroid dosages.  Required sliding scale sensitive with CBGs in the mid to high 100s.  Recommend checking glucose as an outpatient. 2. Per pulmonology, patient was not on any medication to slow progression of idiopathic pulmonary fibrosis, consider discussion with patient for IPF medications.  Significant Procedures: None  Significant Labs and Imaging:  Recent Labs  Lab 05/21/17 0312 05/22/17 0316 05/23/17 0333  WBC 14.6* 12.7* 12.3*  HGB 11.3* 11.3* 11.4*  HCT 34.8* 35.2* 35.7*  PLT 205 186 171   Recent Labs  Lab 05/19/17 0233 05/20/17 0227 05/21/17 0312 05/22/17 0316 05/23/17 0333  NA 138 135 138 137 137  K 4.1 3.6 4.3 4.4 5.1  CL 102 97* 99* 100* 98*  CO2 _0 GLUCOSE 205* 296* 189* 268* 190*  BUN 19 32* 38* 34* 25*  CREATININE 0.84 1.04 1.04 0.81 0.86  CALCIUM 8.6* 8.9 9.1 8.5*  8.7*  PHOS 3.2  --  4.1  --   --       Results/Tests Pending at Time of Discharge: None  Discharge Medications:  Allergies as of 05/23/2017      Reactions   Gabapentin Other (See Comments)   [facility]   Lisinopril Other (See Comments)   Per [facility]   Penicillins Other (See Comments)   Further details unknown per pt. Has patient had a PCN reaction causing immediate rash, facial/tongue/throat swelling, SOB or lightheadedness with hypotension: Unknown Has patient had a PCN reaction causing severe rash involving mucus membranes or skin necrosis: Unknown Has patient had a PCN reaction that required hospitalization: Yes Has patient had a PCN reaction occurring within the last 10 years: Unknown If all of the above answers are "NO", then may proceed with Cephalosporin use.      Medication List    STOP taking these medications   doxycycline 100 MG tablet Commonly known as:  VIBRA-TABS     TAKE these medications   acetaminophen 500 MG tablet Commonly known as:  TYLENOL Take 500 mg by mouth 3 (three) times daily as needed for moderate pain (Take along with Tramadol).   albuterol 108 (90 Base) MCG/ACT inhaler Commonly known as:  PROVENTIL HFA;VENTOLIN HFA Inhale 2 puffs into the lungs every 6 (six) hours as needed for wheezing or shortness of breath.   amLODipine 10 MG tablet Commonly known as:  NORVASC Take 10 mg by mouth daily.   augmented betamethasone dipropionate 0.05 % cream Commonly known as:  DIPROLENE-AF Apply 1 application topically 2 (two) times daily. To upper arms   DULoxetine 30 MG capsule Commonly known as:  CYMBALTA Take 30 mg by mouth daily.   fluticasone 50 MCG/ACT nasal spray Commonly known as:  FLONASE Place 1 spray into both nostrils daily.   guaiFENesin 100 MG/5ML Soln Commonly known as:  ROBITUSSIN Take 10 mLs by mouth 2 (two) times daily.   ipratropium-albuterol 0.5-2.5 (3) MG/3ML Soln Commonly known as:  DUONEB Take 3 mLs by nebulization every 6 (six) hours.   losartan 100 MG tablet Commonly known as:  COZAAR Take 100 mg by mouth daily.   multivitamin with minerals Tabs tablet Take 1 tablet by mouth daily.   nystatin 100000 UNIT/ML  suspension Commonly known as:  MYCOSTATIN Use as directed 5 mLs in the mouth or throat See admin instructions. Swish for 5 minutes then swallow four times daily   omeprazole 20 MG capsule Commonly known as:  PRILOSEC Take 20 mg by mouth daily.   polyvinyl alcohol 1.4 % ophthalmic solution Commonly known as:  LIQUIFILM TEARS Place 1 drop into both eyes every 4 (four) hours as needed for dry eyes.   predniSONE 50 MG tablet Commonly known as:  DELTASONE Take 1 tablet (50 mg total) by mouth daily with breakfast. What changed:    medication strength  how much to take   SINUS RINSE KIT NA Place 1 packet into the nose 2 (two) times daily.   tiotropium 18 MCG inhalation capsule Commonly known as:  SPIRIVA Place 18 mcg into inhaler and inhale daily.   traMADol 50 MG tablet Commonly known as:  ULTRAM Take 50 mg by mouth 3 (three) times daily as needed for moderate pain (Take along with Acetaminophen).   traZODone 50 MG tablet Commonly known as:  DESYREL Take 50 mg by mouth at bedtime.       Discharge Instructions: Please refer to Patient Instructions section of  EMR for full details.  Patient was counseled important signs and symptoms that should prompt return to medical care, changes in medications, dietary instructions, activity restrictions, and follow up appointments.   Follow-Up Appointments: Follow-up Information    Clinic, [facility]. Schedule an appointment as soon as possible for a visit in 3 day(s).   Contact information: [place] [address] [phone]           Bonnita Hollow, MD 05/25/2017, 12:18 PM PGY-1, [place]

## 2017-08-17 ENCOUNTER — Emergency Department (HOSPITAL_COMMUNITY): Payer: Medicare PPO

## 2017-08-17 ENCOUNTER — Inpatient Hospital Stay (HOSPITAL_COMMUNITY)
Admission: EM | Admit: 2017-08-17 | Discharge: 2017-08-23 | DRG: 246 | Disposition: A | Discharge status: Home / Self Care | Payer: Medicare PPO | Attending: Family Medicine | Admitting: Family Medicine

## 2017-08-17 ENCOUNTER — Encounter (HOSPITAL_COMMUNITY): Payer: Self-pay | Admitting: Emergency Medicine

## 2017-08-17 DIAGNOSIS — I3139 Other pericardial effusion (noninflammatory): Secondary | ICD-10-CM | POA: Diagnosis present

## 2017-08-17 DIAGNOSIS — J9621 Acute and chronic respiratory failure with hypoxia: Secondary | ICD-10-CM | POA: Diagnosis present

## 2017-08-17 DIAGNOSIS — G4733 Obstructive sleep apnea (adult) (pediatric): Secondary | ICD-10-CM | POA: Diagnosis present

## 2017-08-17 DIAGNOSIS — I2510 Atherosclerotic heart disease of native coronary artery without angina pectoris: Secondary | ICD-10-CM | POA: Diagnosis present

## 2017-08-17 DIAGNOSIS — Z9981 Dependence on supplemental oxygen: Secondary | ICD-10-CM

## 2017-08-17 DIAGNOSIS — Z880 Allergy status to penicillin: Secondary | ICD-10-CM

## 2017-08-17 DIAGNOSIS — Z7951 Long term (current) use of inhaled steroids: Secondary | ICD-10-CM

## 2017-08-17 DIAGNOSIS — K219 Gastro-esophageal reflux disease without esophagitis: Secondary | ICD-10-CM | POA: Diagnosis present

## 2017-08-17 DIAGNOSIS — I313 Pericardial effusion (noninflammatory): Secondary | ICD-10-CM | POA: Diagnosis present

## 2017-08-17 DIAGNOSIS — I110 Hypertensive heart disease with heart failure: Principal | ICD-10-CM | POA: Diagnosis present

## 2017-08-17 DIAGNOSIS — D638 Anemia in other chronic diseases classified elsewhere: Secondary | ICD-10-CM | POA: Diagnosis present

## 2017-08-17 DIAGNOSIS — I5031 Acute diastolic (congestive) heart failure: Secondary | ICD-10-CM | POA: Diagnosis present

## 2017-08-17 DIAGNOSIS — R6881 Early satiety: Secondary | ICD-10-CM | POA: Diagnosis present

## 2017-08-17 DIAGNOSIS — J849 Interstitial pulmonary disease, unspecified: Secondary | ICD-10-CM | POA: Diagnosis present

## 2017-08-17 DIAGNOSIS — I5082 Biventricular heart failure: Secondary | ICD-10-CM | POA: Diagnosis present

## 2017-08-17 DIAGNOSIS — I50813 Acute on chronic right heart failure: Secondary | ICD-10-CM

## 2017-08-17 DIAGNOSIS — J8410 Pulmonary fibrosis, unspecified: Secondary | ICD-10-CM | POA: Diagnosis present

## 2017-08-17 DIAGNOSIS — E875 Hyperkalemia: Secondary | ICD-10-CM | POA: Diagnosis present

## 2017-08-17 DIAGNOSIS — Z955 Presence of coronary angioplasty implant and graft: Secondary | ICD-10-CM

## 2017-08-17 DIAGNOSIS — Z888 Allergy status to other drugs, medicaments and biological substances status: Secondary | ICD-10-CM

## 2017-08-17 DIAGNOSIS — Z8249 Family history of ischemic heart disease and other diseases of the circulatory system: Secondary | ICD-10-CM

## 2017-08-17 DIAGNOSIS — I10 Essential (primary) hypertension: Secondary | ICD-10-CM | POA: Diagnosis present

## 2017-08-17 DIAGNOSIS — Z79891 Long term (current) use of opiate analgesic: Secondary | ICD-10-CM

## 2017-08-17 DIAGNOSIS — Z79899 Other long term (current) drug therapy: Secondary | ICD-10-CM

## 2017-08-17 DIAGNOSIS — J449 Chronic obstructive pulmonary disease, unspecified: Secondary | ICD-10-CM | POA: Diagnosis present

## 2017-08-17 DIAGNOSIS — Z87891 Personal history of nicotine dependence: Secondary | ICD-10-CM

## 2017-08-17 DIAGNOSIS — I2721 Secondary pulmonary arterial hypertension: Secondary | ICD-10-CM | POA: Diagnosis present

## 2017-08-17 DIAGNOSIS — G4700 Insomnia, unspecified: Secondary | ICD-10-CM | POA: Diagnosis present

## 2017-08-17 DIAGNOSIS — Z7952 Long term (current) use of systemic steroids: Secondary | ICD-10-CM

## 2017-08-17 DIAGNOSIS — Z7902 Long term (current) use of antithrombotics/antiplatelets: Secondary | ICD-10-CM

## 2017-08-17 HISTORY — DX: Atherosclerotic heart disease of native coronary artery without angina pectoris: I25.10

## 2017-08-17 LAB — CBC WITH DIFFERENTIAL/PLATELET
BASOS ABS: 0 10*3/uL (ref 0.0–0.1)
BASOS PCT: 0 %
Eosinophils Absolute: 0.3 10*3/uL (ref 0.0–0.7)
Eosinophils Relative: 2 %
HEMATOCRIT: 36.4 % — AB (ref 39.0–52.0)
HEMOGLOBIN: 11.3 g/dL — AB (ref 13.0–17.0)
Lymphocytes Relative: 14 %
Lymphs Abs: 1.8 10*3/uL (ref 0.7–4.0)
MCH: 27.8 pg (ref 26.0–34.0)
MCHC: 31 g/dL (ref 30.0–36.0)
MCV: 89.4 fL (ref 78.0–100.0)
Monocytes Absolute: 1 10*3/uL (ref 0.1–1.0)
Monocytes Relative: 7 %
NEUTROS PCT: 77 %
Neutro Abs: 10 10*3/uL — ABNORMAL HIGH (ref 1.7–7.7)
Platelets: 203 10*3/uL (ref 150–400)
RBC: 4.07 MIL/uL — AB (ref 4.22–5.81)
RDW: 14 % (ref 11.5–15.5)
WBC: 13.1 10*3/uL — ABNORMAL HIGH (ref 4.0–10.5)

## 2017-08-17 LAB — COMPREHENSIVE METABOLIC PANEL
ALK PHOS: 65 U/L (ref 38–126)
ALT: 14 U/L — ABNORMAL LOW (ref 17–63)
AST: 21 U/L (ref 15–41)
Albumin: 3.2 g/dL — ABNORMAL LOW (ref 3.5–5.0)
Anion gap: 9 (ref 5–15)
BILIRUBIN TOTAL: 0.8 mg/dL (ref 0.3–1.2)
BUN: 14 mg/dL (ref 6–20)
CALCIUM: 8.5 mg/dL — AB (ref 8.9–10.3)
CO2: 23 mmol/L (ref 22–32)
Chloride: 103 mmol/L (ref 101–111)
Creatinine, Ser: 1.2 mg/dL (ref 0.61–1.24)
GFR calc Af Amer: >60 mL/min (ref >60)
GFR calc non Af Amer: >60 mL/min (ref >60)
GLUCOSE: 155 mg/dL — AB (ref 65–99)
POTASSIUM: 3.9 mmol/L (ref 3.5–5.1)
Sodium: 135 mmol/L (ref 135–145)
TOTAL PROTEIN: 6.4 g/dL — AB (ref 6.5–8.1)

## 2017-08-17 LAB — D-DIMER, QUANTITATIVE: D-Dimer, Quant: 1.4 ug/mL-FEU — ABNORMAL HIGH (ref 0.00–0.50)

## 2017-08-17 LAB — I-STAT TROPONIN, ED: TROPONIN I, POC: 0.03 ng/mL (ref 0.00–0.08)

## 2017-08-17 LAB — I-STAT CG4 LACTIC ACID, ED: Lactic Acid, Venous: 1.46 mmol/L (ref 0.5–1.9)

## 2017-08-17 MED ORDER — ALBUTEROL (5 MG/ML) CONTINUOUS INHALATION SOLN
10.0000 mg/h | INHALATION_SOLUTION | Freq: Once | RESPIRATORY_TRACT | Status: AC
  Administered 2017-08-18: 10 mg/h via RESPIRATORY_TRACT
  Filled 2017-08-17: qty 20

## 2017-08-17 NOTE — ED Provider Notes (Signed)
[facility] EMERGENCY DEPARTMENT Provider Note   CSN: [id number] Arrival date & time: 08/17/17  2253     History   Chief Complaint Chief Complaint  Patient presents with  . Shortness of Breath    HPI Jose Flores is a 67 y.o. male.  Patient presents to the emergency department for evaluation of difficulty breathing.  Patient has a history of pulmonary fibrosis.  He is normally on nasal cannula oxygen at 6 L.  Over the last 3 or 4 days he has had progressive worsening of his breathing with cough.  Cough occasionally productive of some green sputum.  Tonight his breathing significantly worsened and is associated with a heaviness across his chest.  EMS report that the patient was exhibiting room oxygen saturations of 60% on his normal 6 L.  He was placed on a nonrebreather, administered a DuoNeb, Solu-Medrol 125 mg, magnesium 2 g.  With this treatment his oxygen saturations have improved during transport.     Past Medical History:  Diagnosis Date  . Acid reflux   . COPD (chronic obstructive pulmonary disease) ([place])   . Hypertension   . Insomnia   . Pulmonary fibrosis [facility])     Patient Active Problem List   Diagnosis Date Noted  . Acute respiratory failure with hypoxemia ([place])   . Essential hypertension   . Malnutrition of moderate degree 05/20/2017  . SOB (shortness of breath)   . COPD exacerbation ([place])   . Interstitial lung disease ([place])   . Hypoxia 05/16/2017  . Hypoxic episode 05/16/2017    History reviewed. No pertinent surgical history.      Home Medications    Prior to Admission medications   Medication Sig Start Date End Date Taking? Authorizing Provider  acetaminophen (TYLENOL) 500 MG tablet Take 500 mg by mouth 3 (three) times daily as needed for moderate pain (Take along with Tramadol).    [provider]  albuterol (PROVENTIL HFA;VENTOLIN HFA) 108 (90 Base) MCG/ACT inhaler Inhale 2 puffs into the lungs every 6 (six) hours as  needed for wheezing or shortness of breath.    [provider]  amLODipine (NORVASC) 10 MG tablet Take 10 mg by mouth daily.    [provider]  augmented betamethasone dipropionate (DIPROLENE-AF) 0.05 % cream Apply 1 application topically 2 (two) times daily. To upper arms    [provider]  DULoxetine (CYMBALTA) 30 MG capsule Take 30 mg by mouth daily.    [provider]  fluticasone (FLONASE) 50 MCG/ACT nasal spray Place 1 spray into both nostrils daily.    [provider]  guaiFENesin (ROBITUSSIN) 100 MG/5ML SOLN Take 10 mLs by mouth 2 (two) times daily.    [provider]  Hypertonic Nasal Wash (SINUS RINSE KIT NA) Place 1 packet into the nose 2 (two) times daily.    [provider]  ipratropium-albuterol (DUONEB) 0.5-2.5 (3) MG/3ML SOLN Take 3 mLs by nebulization every 6 (six) hours.    [provider]  losartan (COZAAR) 100 MG tablet Take 100 mg by mouth daily.    [provider]  Multiple Vitamin (MULTIVITAMIN WITH MINERALS) TABS tablet Take 1 tablet by mouth daily.    [provider]  nystatin (MYCOSTATIN) 100000 UNIT/ML suspension Use as directed 5 mLs in the mouth or throat See admin instructions. Swish for 5 minutes then swallow four times daily    [provider]  omeprazole (PRILOSEC) 20 MG capsule Take 20 mg by mouth daily.  [provider]  polyvinyl alcohol (LIQUIFILM TEARS) 1.4 % ophthalmic solution Place 1 drop into both eyes every 4 (four) hours as needed for dry eyes.    [provider]  predniSONE (DELTASONE) 50 MG tablet Take 1 tablet (50 mg total) by mouth daily with breakfast. 05/23/17   Bufford Lope, DO  tiotropium (SPIRIVA) 18 MCG inhalation capsule Place 18 mcg into inhaler and inhale daily.    [provider]  traMADol (ULTRAM) 50 MG tablet Take 50 mg by mouth 3 (three) times daily as needed for moderate pain (Take along with Acetaminophen).     [provider]  traZODone (DESYREL) 50 MG tablet Take 50 mg by mouth at bedtime.    [provider]    Family History Family History  Problem Relation Age of Onset  . Colon cancer Mother   . Heart failure Father   . Breast cancer Sister     Social History Social History   Tobacco Use  . Smoking status: Former Research scientist (life sciences)  . Smokeless tobacco: Never Used  Substance Use Topics  . Alcohol use: Yes    Comment: occ  . Drug use: No     Allergies   Gabapentin; Lisinopril; and Penicillins   Review of Systems Review of Systems  Respiratory: Positive for cough, chest tightness and shortness of breath.   All other systems reviewed and are negative.    Physical Exam Updated Vital Signs BP 106/60   Pulse (!) 104   Temp 98.2 F (36.8 C) (Oral)   Resp (!) 23   Ht _0  (1.727 m)   Wt 72.6 kg (160 lb)   SpO2 96%   BMI 24.33 kg/m   Physical Exam  Constitutional: He is oriented to person, place, and time. He appears well-developed and well-nourished. No distress.  HENT:  Head: Normocephalic and atraumatic.  Right Ear: Hearing normal.  Left Ear: Hearing normal.  Nose: Nose normal.  Mouth/Throat: Oropharynx is clear and moist and mucous membranes are normal.  Eyes: Pupils are equal, round, and reactive to light. Conjunctivae and EOM are normal.  Neck: Normal range of motion. Neck supple.  Cardiovascular: Regular rhythm, S1 normal and S2 normal. Exam reveals no gallop and no friction rub.  No murmur heard. Pulmonary/Chest: Accessory muscle usage present. Tachypnea noted. He has decreased breath sounds. He has wheezes. He has rales. He exhibits no tenderness.  Abdominal: Soft. Normal appearance and bowel sounds are normal. There is no hepatosplenomegaly. There is no tenderness. There is no rebound, no guarding, no tenderness at McBurney's point and negative Murphy's sign. No hernia.  Musculoskeletal: Normal range of motion.  Neurological: He is alert and  oriented to person, place, and time. He has normal strength. No cranial nerve deficit or sensory deficit. Coordination normal. GCS eye subscore is 4. GCS verbal subscore is 5. GCS motor subscore is 6.  Skin: Skin is warm, dry and intact. No rash noted. No cyanosis.  Psychiatric: He has a normal mood and affect. His speech is normal and behavior is normal. Thought content normal.  Nursing note and vitals reviewed.    ED Treatments / Results  Labs (all labs ordered are listed, but only abnormal results are displayed) Labs Reviewed  CBC WITH DIFFERENTIAL/PLATELET - Abnormal; Notable for the following components:      Result Value   WBC 13.1 (*)    RBC 4.07 (*)    Hemoglobin 11.3 (*)    HCT 36.4 (*)    Neutro Abs  10.0 (*)    All other components within normal limits  COMPREHENSIVE METABOLIC PANEL - Abnormal; Notable for the following components:   Glucose, Bld 155 (*)    Calcium 8.5 (*)    Total Protein 6.4 (*)    Albumin 3.2 (*)    ALT 14 (*)    All other components within normal limits  BRAIN NATRIURETIC PEPTIDE - Abnormal; Notable for the following components:   B Natriuretic Peptide 940.1 (*)    All other components within normal limits  D-DIMER, QUANTITATIVE (NOT AT [facility]) - Abnormal; Notable for the following components:   D-Dimer, Quant 1.40 (*)    All other components within normal limits  I-STAT ARTERIAL BLOOD GAS, ED - Abnormal; Notable for the following components:   pO2, Arterial 61.0 (*)    All other components within normal limits  I-STAT CG4 LACTIC ACID, ED  I-STAT TROPONIN, ED    EKG EKG Interpretation  Date/Time:  Wednesday Aug 17 2017 23:08:11 EDT Ventricular Rate:  101 PR Interval:    QRS Duration: 100 QT Interval:  376 QTC Calculation: 488 R Axis:   141 Text Interpretation:  Sinus tachycardia Probable left atrial enlargement Left posterior fascicular block Low voltage, precordial leads Borderline repolarization abnormality Borderline prolonged QT  interval Confirmed by Orpah Greek [phone]) on 08/17/2017 11:11:43 PM   Radiology Ct Angio Chest Pe W Or Wo Contrast  Result Date: 08/18/2017 CLINICAL DATA:  67 year old male with shortness of breath. Positive D-dimer. Concern for pulmonary embolism. EXAM: CT ANGIOGRAPHY CHEST WITH CONTRAST TECHNIQUE: Multidetector CT imaging of the chest was performed using the standard protocol during bolus administration of intravenous contrast. Multiplanar CT image reconstructions and MIPs were obtained to evaluate the vascular anatomy. CONTRAST:  192m ISOVUE-370 IOPAMIDOL (ISOVUE-370) INJECTION 76% COMPARISON:  Chest radiograph dated 08/17/2017 FINDINGS: Cardiovascular: Mild cardiomegaly. Small pericardial effusion measuring up to 1 cm in thickness. Correlation with echocardiogram recommended. There is mild atherosclerotic calcification of the thoracic aorta. No aneurysmal dilatation. Evaluation of the aorta is limited due to suboptimal opacification and timing of the contrast. There is no CT evidence of pulmonary embolism. Mediastinum/Nodes: Mildly enlarged mediastinal lymph nodes as seen on the prior CT, of indeterminate clinical significance or etiology. These measure 17 mm in short axis in the AP window and 16 mm in the right paratracheal region. A mildly enlarged lymph node or confluence of lymph node in the subcarinal region appears similar to prior CT and measures approximately 22 mm in short axis. Top-normal right hilar lymph nodes also noted. The esophagus and the thyroid gland are grossly unremarkable. No mediastinal fluid collection. Lungs/Pleura: Diffuse interstitial coarsening and fibrosis as seen previously. There is no consolidative changes. No pleural effusion or pneumothorax. The central airways are patent. Upper Abdomen: No acute abnormality. Musculoskeletal: No chest wall abnormality. No acute or significant osseous findings. Review of the MIP images confirms the above findings. IMPRESSION: 1. No  acute intrathoracic pathology. No CT evidence of pulmonary embolism. 2. Findings of interstitial fibrosis.  No consolidative changes. 3. Mildly enlarged mediastinal and hilar lymph nodes similar to prior CT and of indeterminate clinical significance or etiology, likely reactive. Clinical correlation is recommended. 4. Mild cardiomegaly and small pericardial effusion. The pericardial effusion is new compared to the prior CT. Correlation with clinical exam and echocardiogram recommended. Electronically Signed   By: AAnner CreteM.D.   On: 08/18/2017 02:43   Dg Chest Port 1 View  Result Date: 08/17/2017 CLINICAL DATA:  67year old male with shortness  of breath. EXAM: PORTABLE CHEST 1 VIEW COMPARISON:  Chest radiograph dated 05/20/2017 FINDINGS: There is diffuse interstitial coarsening and fibrosis. There is no focal consolidation, pleural effusion, or pneumothorax. Stable cardiac silhouette. No acute osseous pathology. IMPRESSION: 1. No acute cardiopulmonary process. 2. Chronic interstitial fibrosis. Electronically Signed   By: Anner Crete M.D.   On: 08/17/2017 23:55    Procedures Procedures (including critical care time)  Medications Ordered in ED Medications  iopamidol (ISOVUE-370) 76 % injection (has no administration in time range)  furosemide (LASIX) injection 40 mg (has no administration in time range)  albuterol (PROVENTIL,VENTOLIN) solution continuous neb (10 mg/hr Nebulization Given 08/18/17 0053)  iopamidol (ISOVUE-370) 76 % injection 100 mL (100 mLs Intravenous Contrast Given 08/18/17 0221)     Initial Impression / Assessment and Plan / ED Course  I have reviewed the triage vital signs and the nursing notes.  Pertinent labs & imaging results that were available during my care of the patient were reviewed by me and considered in my medical decision making (see chart for details).     Patient presents to the emergency department for evaluation of difficulty breathing.  Patient has  a history of pulmonary fibrosis.  He is normally on 6 L nasal cannula oxygen continuously.  He reports progressive worsening of his shortness of breath over 3 or 4-days, but significant worsening tonight.  Patient found to be profoundly hypoxic and dyspneic by EMS.  He does report that he has had some cough, but he does not have any evidence of pneumonia.  He is afebrile.  Blood gas confirms hypoxia without CO2 retention.  Patient underwent CT scan to further evaluate for possibility of PE.  No PE noted.  He is noted to have a small pericardial effusion which is new.  He does not have any tamponade physiology.  Patient's BNP is significantly elevated from his baseline.  Will administer IV Lasix, as he might have congestive heart failure concomitant with his pulmonary fibrosis.  Patient still dyspneic despite Solu-Medrol, magnesium, continuous nebulizer treatment.  Patient will be placed on BiPAP.  CRITICAL CARE Performed by: Orpah Greek   Total critical care time: 30 minutes  Critical care time was exclusive of separately billable procedures and treating other patients.  Critical care was necessary to treat or prevent imminent or life-threatening deterioration.  Critical care was time spent personally by me on the following activities: development of treatment plan with patient and/or surrogate as well as nursing, discussions with consultants, evaluation of patient's response to treatment, examination of patient, obtaining history from patient or surrogate, ordering and performing treatments and interventions, ordering and review of laboratory studies, ordering and review of radiographic studies, pulse oximetry and re-evaluation of patient's condition.   Final Clinical Impressions(s) / ED Diagnoses   Final diagnoses:  Acute on chronic respiratory failure with hypoxia ([place])  Pericardial effusion    ED Discharge Orders    None       Orpah Greek, MD 08/18/17 [phone]

## 2017-08-17 NOTE — ED Triage Notes (Signed)
Per EMS:  Pt diagnosed with RSV in February, called out today for worsening SOB x 3-4 days.  Patient 60% on EMS arrival, normally on 6L [place] at home all the time.  Placed on NRB and up to 84%.  Patient given 1 duoneb, 125 solu-medrol, 2g of magnesium, with increasing O2 saturations after treatments.  Dr. Blinda Leatherwood at bedside at this time

## 2017-08-18 ENCOUNTER — Encounter (HOSPITAL_COMMUNITY): Payer: Self-pay | Admitting: Family Medicine

## 2017-08-18 ENCOUNTER — Inpatient Hospital Stay (HOSPITAL_COMMUNITY): Payer: Medicare PPO

## 2017-08-18 ENCOUNTER — Emergency Department (HOSPITAL_COMMUNITY): Payer: Medicare PPO

## 2017-08-18 DIAGNOSIS — R6881 Early satiety: Secondary | ICD-10-CM | POA: Diagnosis present

## 2017-08-18 DIAGNOSIS — Z8249 Family history of ischemic heart disease and other diseases of the circulatory system: Secondary | ICD-10-CM | POA: Diagnosis not present

## 2017-08-18 DIAGNOSIS — I110 Hypertensive heart disease with heart failure: Secondary | ICD-10-CM | POA: Diagnosis present

## 2017-08-18 DIAGNOSIS — Z888 Allergy status to other drugs, medicaments and biological substances status: Secondary | ICD-10-CM | POA: Diagnosis not present

## 2017-08-18 DIAGNOSIS — K219 Gastro-esophageal reflux disease without esophagitis: Secondary | ICD-10-CM | POA: Diagnosis present

## 2017-08-18 DIAGNOSIS — J849 Interstitial pulmonary disease, unspecified: Secondary | ICD-10-CM

## 2017-08-18 DIAGNOSIS — Z880 Allergy status to penicillin: Secondary | ICD-10-CM | POA: Diagnosis not present

## 2017-08-18 DIAGNOSIS — I313 Pericardial effusion (noninflammatory): Secondary | ICD-10-CM | POA: Diagnosis present

## 2017-08-18 DIAGNOSIS — J441 Chronic obstructive pulmonary disease with (acute) exacerbation: Secondary | ICD-10-CM

## 2017-08-18 DIAGNOSIS — I2510 Atherosclerotic heart disease of native coronary artery without angina pectoris: Secondary | ICD-10-CM | POA: Diagnosis present

## 2017-08-18 DIAGNOSIS — I10 Essential (primary) hypertension: Secondary | ICD-10-CM

## 2017-08-18 DIAGNOSIS — I2721 Secondary pulmonary arterial hypertension: Secondary | ICD-10-CM | POA: Diagnosis present

## 2017-08-18 DIAGNOSIS — Z9981 Dependence on supplemental oxygen: Secondary | ICD-10-CM | POA: Diagnosis not present

## 2017-08-18 DIAGNOSIS — Z79891 Long term (current) use of opiate analgesic: Secondary | ICD-10-CM | POA: Diagnosis not present

## 2017-08-18 DIAGNOSIS — Z87891 Personal history of nicotine dependence: Secondary | ICD-10-CM | POA: Diagnosis not present

## 2017-08-18 DIAGNOSIS — E875 Hyperkalemia: Secondary | ICD-10-CM | POA: Diagnosis present

## 2017-08-18 DIAGNOSIS — I5082 Biventricular heart failure: Secondary | ICD-10-CM | POA: Diagnosis present

## 2017-08-18 DIAGNOSIS — D638 Anemia in other chronic diseases classified elsewhere: Secondary | ICD-10-CM | POA: Diagnosis present

## 2017-08-18 DIAGNOSIS — J449 Chronic obstructive pulmonary disease, unspecified: Secondary | ICD-10-CM | POA: Diagnosis present

## 2017-08-18 DIAGNOSIS — Z955 Presence of coronary angioplasty implant and graft: Secondary | ICD-10-CM | POA: Diagnosis not present

## 2017-08-18 DIAGNOSIS — R040 Epistaxis: Secondary | ICD-10-CM | POA: Diagnosis present

## 2017-08-18 DIAGNOSIS — G4733 Obstructive sleep apnea (adult) (pediatric): Secondary | ICD-10-CM | POA: Diagnosis present

## 2017-08-18 DIAGNOSIS — I50813 Acute on chronic right heart failure: Secondary | ICD-10-CM | POA: Diagnosis not present

## 2017-08-18 DIAGNOSIS — I5031 Acute diastolic (congestive) heart failure: Secondary | ICD-10-CM | POA: Diagnosis present

## 2017-08-18 DIAGNOSIS — Z7982 Long term (current) use of aspirin: Secondary | ICD-10-CM | POA: Diagnosis not present

## 2017-08-18 DIAGNOSIS — Z79899 Other long term (current) drug therapy: Secondary | ICD-10-CM | POA: Diagnosis not present

## 2017-08-18 DIAGNOSIS — Z7902 Long term (current) use of antithrombotics/antiplatelets: Secondary | ICD-10-CM | POA: Diagnosis not present

## 2017-08-18 DIAGNOSIS — Z7951 Long term (current) use of inhaled steroids: Secondary | ICD-10-CM | POA: Diagnosis not present

## 2017-08-18 DIAGNOSIS — Z7952 Long term (current) use of systemic steroids: Secondary | ICD-10-CM | POA: Diagnosis not present

## 2017-08-18 DIAGNOSIS — J9621 Acute and chronic respiratory failure with hypoxia: Secondary | ICD-10-CM | POA: Diagnosis present

## 2017-08-18 DIAGNOSIS — I272 Pulmonary hypertension, unspecified: Secondary | ICD-10-CM | POA: Diagnosis not present

## 2017-08-18 DIAGNOSIS — J8410 Pulmonary fibrosis, unspecified: Secondary | ICD-10-CM | POA: Diagnosis present

## 2017-08-18 DIAGNOSIS — G4700 Insomnia, unspecified: Secondary | ICD-10-CM | POA: Diagnosis present

## 2017-08-18 DIAGNOSIS — I3139 Other pericardial effusion (noninflammatory): Secondary | ICD-10-CM | POA: Diagnosis present

## 2017-08-18 LAB — I-STAT ARTERIAL BLOOD GAS, ED
Acid-base deficit: 2 mmol/L (ref 0.0–2.0)
Bicarbonate: 22.3 mmol/L (ref 20.0–28.0)
O2 SAT: 92 %
PCO2 ART: 33.8 mmHg (ref 32.0–48.0)
Patient temperature: 98.2
TCO2: 23 mmol/L (ref 22–32)
pH, Arterial: 7.427 (ref 7.350–7.450)
pO2, Arterial: 61 mmHg — ABNORMAL LOW (ref 83.0–108.0)

## 2017-08-18 LAB — ECHOCARDIOGRAM COMPLETE
Height: 68 in
WEIGHTICAEL: 2560 [oz_av]

## 2017-08-18 LAB — BRAIN NATRIURETIC PEPTIDE: B Natriuretic Peptide: 940.1 pg/mL — ABNORMAL HIGH (ref 0.0–100.0)

## 2017-08-18 LAB — MRSA PCR SCREENING: MRSA by PCR: NEGATIVE

## 2017-08-18 MED ORDER — IOPAMIDOL (ISOVUE-370) INJECTION 76%
100.0000 mL | Freq: Once | INTRAVENOUS | Status: AC | PRN
  Administered 2017-08-18: 100 mL via INTRAVENOUS

## 2017-08-18 MED ORDER — SODIUM CHLORIDE 0.9 % IV SOLN: 250.0000 mL | INTRAVENOUS | Status: DC | PRN

## 2017-08-18 MED ORDER — ONDANSETRON HCL 4 MG PO TABS: 4.0000 mg | ORAL_TABLET | Freq: Four times a day (QID) | ORAL | Status: DC | PRN

## 2017-08-18 MED ORDER — METHYLPREDNISOLONE SODIUM SUCC 125 MG IJ SOLR
60.0000 mg | Freq: Four times a day (QID) | INTRAMUSCULAR | Status: DC
  Administered 2017-08-18 (×2): 60 mg via INTRAVENOUS
  Filled 2017-08-18 (×2): qty 2

## 2017-08-18 MED ORDER — ENOXAPARIN SODIUM 40 MG/0.4ML ~~LOC~~ SOLN
40.0000 mg | SUBCUTANEOUS | Status: DC
  Administered 2017-08-18 – 2017-08-23 (×5): 40 mg via SUBCUTANEOUS
  Filled 2017-08-18 (×5): qty 0.4

## 2017-08-18 MED ORDER — AMLODIPINE BESYLATE 5 MG PO TABS: 10.0000 mg | ORAL_TABLET | Freq: Every day | ORAL | Status: DC

## 2017-08-18 MED ORDER — ACETAMINOPHEN 325 MG PO TABS: 650.0000 mg | ORAL_TABLET | Freq: Four times a day (QID) | ORAL | Status: DC | PRN

## 2017-08-18 MED ORDER — SODIUM CHLORIDE 0.9% FLUSH: 3.0000 mL | INTRAVENOUS | Status: DC | PRN

## 2017-08-18 MED ORDER — ADULT MULTIVITAMIN W/MINERALS CH
1.0000 | ORAL_TABLET | Freq: Every day | ORAL | Status: DC
  Administered 2017-08-18 – 2017-08-23 (×6): 1 via ORAL
  Filled 2017-08-18 (×6): qty 1

## 2017-08-18 MED ORDER — BISACODYL 5 MG PO TBEC: 5.0000 mg | DELAYED_RELEASE_TABLET | Freq: Every day | ORAL | Status: DC | PRN

## 2017-08-18 MED ORDER — PANTOPRAZOLE SODIUM 40 MG PO TBEC
40.0000 mg | DELAYED_RELEASE_TABLET | Freq: Every day | ORAL | Status: DC
  Administered 2017-08-18 – 2017-08-20 (×3): 40 mg via ORAL
  Filled 2017-08-18 (×3): qty 1

## 2017-08-18 MED ORDER — OXYCODONE HCL 5 MG PO TABS
5.0000 mg | ORAL_TABLET | ORAL | Status: DC | PRN
  Administered 2017-08-22: 22:00:00 5 mg via ORAL
  Filled 2017-08-18: qty 1

## 2017-08-18 MED ORDER — DULOXETINE HCL 30 MG PO CPEP
30.0000 mg | ORAL_CAPSULE | Freq: Every day | ORAL | Status: DC
  Administered 2017-08-18 – 2017-08-20 (×3): 30 mg via ORAL
  Filled 2017-08-18 (×4): qty 1

## 2017-08-18 MED ORDER — FUROSEMIDE 10 MG/ML IJ SOLN
40.0000 mg | Freq: Once | INTRAMUSCULAR | Status: AC
  Administered 2017-08-18: 40 mg via INTRAVENOUS
  Filled 2017-08-18: qty 4

## 2017-08-18 MED ORDER — TRAMADOL HCL 50 MG PO TABS: 50.0000 mg | ORAL_TABLET | Freq: Three times a day (TID) | ORAL | Status: DC | PRN

## 2017-08-18 MED ORDER — SENNOSIDES-DOCUSATE SODIUM 8.6-50 MG PO TABS: 1.0000 | ORAL_TABLET | Freq: Every evening | ORAL | Status: DC | PRN

## 2017-08-18 MED ORDER — IOPAMIDOL (ISOVUE-370) INJECTION 76%
INTRAVENOUS | Status: AC
  Filled 2017-08-18: qty 100

## 2017-08-18 MED ORDER — GUAIFENESIN 100 MG/5ML PO SOLN: 10.0000 mL | Freq: Two times a day (BID) | ORAL | Status: DC

## 2017-08-18 MED ORDER — ACETAMINOPHEN 650 MG RE SUPP: 650.0000 mg | Freq: Four times a day (QID) | RECTAL | Status: DC | PRN

## 2017-08-18 MED ORDER — TRAZODONE HCL 50 MG PO TABS
50.0000 mg | ORAL_TABLET | Freq: Every day | ORAL | Status: DC
  Administered 2017-08-18 – 2017-08-22 (×5): 50 mg via ORAL
  Filled 2017-08-18 (×5): qty 1

## 2017-08-18 MED ORDER — FLUTICASONE PROPIONATE 50 MCG/ACT NA SUSP
1.0000 | Freq: Every day | NASAL | Status: DC
  Administered 2017-08-18 – 2017-08-23 (×6): 1 via NASAL
  Filled 2017-08-18 (×2): qty 16

## 2017-08-18 MED ORDER — LOSARTAN POTASSIUM 50 MG PO TABS
100.0000 mg | ORAL_TABLET | Freq: Every day | ORAL | Status: DC
  Administered 2017-08-18 – 2017-08-23 (×6): 100 mg via ORAL
  Filled 2017-08-18 (×6): qty 2

## 2017-08-18 MED ORDER — POLYVINYL ALCOHOL 1.4 % OP SOLN: 1.0000 [drp] | OPHTHALMIC | Status: DC | PRN

## 2017-08-18 MED ORDER — SODIUM CHLORIDE 0.9% FLUSH
3.0000 mL | Freq: Two times a day (BID) | INTRAVENOUS | Status: DC
  Administered 2017-08-19 – 2017-08-21 (×5): 3 mL via INTRAVENOUS

## 2017-08-18 MED ORDER — METHYLPREDNISOLONE SODIUM SUCC 125 MG IJ SOLR
60.0000 mg | Freq: Three times a day (TID) | INTRAMUSCULAR | Status: DC
  Administered 2017-08-18 – 2017-08-19 (×2): 60 mg via INTRAVENOUS
  Filled 2017-08-18 (×2): qty 2

## 2017-08-18 MED ORDER — IPRATROPIUM-ALBUTEROL 0.5-2.5 (3) MG/3ML IN SOLN
3.0000 mL | Freq: Four times a day (QID) | RESPIRATORY_TRACT | Status: DC
  Administered 2017-08-18 (×3): 3 mL via RESPIRATORY_TRACT
  Filled 2017-08-18 (×3): qty 3

## 2017-08-18 MED ORDER — ONDANSETRON HCL 4 MG/2ML IJ SOLN: 4.0000 mg | Freq: Four times a day (QID) | INTRAMUSCULAR | Status: DC | PRN

## 2017-08-18 MED ORDER — SODIUM CHLORIDE 0.9% FLUSH
3.0000 mL | Freq: Two times a day (BID) | INTRAVENOUS | Status: DC
  Administered 2017-08-18 – 2017-08-21 (×6): 3 mL via INTRAVENOUS

## 2017-08-18 MED ORDER — ALBUTEROL SULFATE (2.5 MG/3ML) 0.083% IN NEBU: 2.5000 mg | INHALATION_SOLUTION | RESPIRATORY_TRACT | Status: DC | PRN

## 2017-08-18 NOTE — Progress Notes (Signed)
  Echocardiogram 2D Echocardiogram has been performed.  Jose Flores 08/18/2017, 4:06 PM

## 2017-08-18 NOTE — ED Notes (Signed)
Provider at bedside

## 2017-08-18 NOTE — H&P (Signed)
History and Physical    Jose Flores EQA:[id number] DOB: Feb 25, 1951 DOA: 08/17/2017  PCP: [facility]   Patient coming from: Home   Chief Complaint: Acute SOB   HPI: Jose Flores is a 67 y.o. male with medical history significant for interstitial lung disease, COPD, hypertension, and chronic hypoxic respiratory failure, now presenting to the emergency department with acute shortness of breath.  Patient reports that he been in his usual state of health until tonight when he developed acute worsening in his chronic dyspnea.  He has had occasional scant sputum production but denies fevers, chills, or chest pain.  He reports new development of bilateral lower extremity swelling over the past 2 weeks.  Has been under evaluation for possible transplant at [facility].  Called EMS tonight and was found to be saturating 60% on his usual 6 L/min of supplemental oxygen.  He was placed on nonrebreather and treated with 125 mg IV Solu-Medrol, 2 g IV magnesium, and DuoNeb prior to arrival in the ED.  ED Course: Upon arrival to the ED, patient is found to be afebrile, saturating low 90s on nonrebreather, tachypneic, slightly tachycardic, and with stable blood pressure.  EKG features a sinus tachycardia with rate 101 and LPFB.  Chest x-ray is notable for chronic interstitial fibrosis but no acute findings.  Mr. panel is unremarkable, CBC is notable for a stable chronic leukocytosis and a mild chronic normocytic anemia.  Lactic acid is normal, troponin is normal, and BNP is elevated to 940.  D-dimer is also elevated to 1.40 and CTA chest was performed, negative for acute finding, negative for PE, notable for small pericardial effusion that was not seen on the prior CT in February.  Patient was placed on BiPAP in the ED and given 40 mg IV Lasix.  He remains hemodynamically stable but dyspneic at rest and will be admitted to the stepdown unit for ongoing evaluation and management of acute on chronic  hypoxic respiratory failure, possibly secondary to COPD exacerbation, and/or cardiogenic.  Review of Systems:  All other systems reviewed and apart from HPI, are negative.  Past Medical History:  Diagnosis Date  . Acid reflux   . COPD (chronic obstructive pulmonary disease) ([place])   . Hypertension   . Insomnia   . Pulmonary fibrosis ([place])     History reviewed. No pertinent surgical history.   reports that he has quit smoking. He has never used smokeless tobacco. He reports that he drinks alcohol. He reports that he does not use drugs.  Allergies  Allergen Reactions  . Gabapentin Other (See Comments)    [facility]  . Lisinopril Other (See Comments)    [facility]  . Penicillins Other (See Comments)    Further details unknown per pt. Has patient had a PCN reaction causing immediate rash, facial/tongue/throat swelling, SOB or lightheadedness with hypotension: Unknown Has patient had a PCN reaction causing severe rash involving mucus membranes or skin necrosis: Unknown Has patient had a PCN reaction that required hospitalization: Yes Has patient had a PCN reaction occurring within the last 10 years: Unknown If all of the above answers are "NO", then may proceed with Cephalosporin use.     Family History  Problem Relation Age of Onset  . Colon cancer Mother   . Heart failure Father   . Breast cancer Sister      Prior to Admission medications   Medication Sig Start Date End Date Taking? Authorizing Provider  acetaminophen (TYLENOL) 500  MG tablet Take 500 mg by mouth 3 (three) times daily as needed for moderate pain (Take along with Tramadol).    [provider]  albuterol (PROVENTIL HFA;VENTOLIN HFA) 108 (90 Base) MCG/ACT inhaler Inhale 2 puffs into the lungs every 6 (six) hours as needed for wheezing or shortness of breath.    [provider]  amLODipine (NORVASC) 10 MG tablet Take 10 mg by mouth daily.    [provider]  augmented  betamethasone dipropionate (DIPROLENE-AF) 0.05 % cream Apply 1 application topically 2 (two) times daily. To upper arms    [provider]  DULoxetine (CYMBALTA) 30 MG capsule Take 30 mg by mouth daily.    [provider]  fluticasone (FLONASE) 50 MCG/ACT nasal spray Place 1 spray into both nostrils daily.    [provider]  guaiFENesin (ROBITUSSIN) 100 MG/5ML SOLN Take 10 mLs by mouth 2 (two) times daily.    [provider]  Hypertonic Nasal Wash (SINUS RINSE KIT NA) Place 1 packet into the nose 2 (two) times daily.    [provider]  ipratropium-albuterol (DUONEB) 0.5-2.5 (3) MG/3ML SOLN Take 3 mLs by nebulization every 6 (six) hours.    [provider]  losartan (COZAAR) 100 MG tablet Take 100 mg by mouth daily.    [provider]  Multiple Vitamin (MULTIVITAMIN WITH MINERALS) TABS tablet Take 1 tablet by mouth daily.    [provider]  nystatin (MYCOSTATIN) 100000 UNIT/ML suspension Use as directed 5 mLs in the mouth or throat See admin instructions. Swish for 5 minutes then swallow four times daily    [provider]  omeprazole (PRILOSEC) 20 MG capsule Take 20 mg by mouth daily.    [provider]  polyvinyl alcohol (LIQUIFILM TEARS) 1.4 % ophthalmic solution Place 1 drop into both eyes every 4 (four) hours as needed for dry eyes.    [provider]  predniSONE (DELTASONE) 50 MG tablet Take 1 tablet (50 mg total) by mouth daily with breakfast. 05/23/17   Bufford Lope, DO  tiotropium (SPIRIVA) 18 MCG inhalation capsule Place 18 mcg into inhaler and inhale daily.    [provider]  traMADol (ULTRAM) 50 MG tablet Take 50 mg by mouth 3 (three) times daily as needed for moderate pain (Take along with Acetaminophen).    [provider]  traZODone (DESYREL) 50 MG tablet Take 50 mg by mouth at bedtime.    [provider]    Physical Exam: Vitals:   08/18/17 0100 08/18/17  0130 08/18/17 0230 08/18/17 0330  BP: 110/70 129/81 106/60 118/74  Pulse: 91 100 (!) 104 (!) 107  Resp: (!) 24 (!) 21 (!) 23 (!) 27  Temp:      TempSrc:      SpO2: 94% 96% 96% 96%  Weight:      Height:          Constitutional: Tachypneic and using accessory muscles, no pallor, no diaphoresis  Eyes: PERTLA, lids and conjunctivae normal ENMT: Mucous membranes are moist. Posterior pharynx clear of any exudate or lesions.   Neck: normal, supple, no masses, no thyromegaly Respiratory: Diminished breath sounds bilaterally with fine crackles. Mild tachypnea, accessory muscle recruitment. No pallor or cyanosis.   Cardiovascular: S1 & S2 heard, regular rate and rhythm. No extremity edema.   Abdomen: No distension, no tenderness, soft. Bowel sounds normal.  Musculoskeletal: no clubbing / cyanosis. No joint deformity upper and lower extremities.   Skin: no significant rashes, lesions,  ulcers. Warm, dry, well-perfused. Neurologic: CN 2-12 grossly intact. Sensation intact. Strength 5/5 in all 4 limbs.  Psychiatric: Alert and oriented x 3. Pleasant and cooperative.     Labs on Admission: I have personally reviewed following labs and imaging studies  CBC: Recent Labs  Lab 08/17/17 2308  WBC 13.1*  NEUTROABS 10.0*  HGB 11.3*  HCT 36.4*  MCV 89.4  PLT 656   Basic Metabolic Panel: Recent Labs  Lab 08/17/17 2308  NA 135  K 3.9  CL 103  CO2 23  GLUCOSE 155*  BUN 14  CREATININE 1.20  CALCIUM 8.5*   GFR: Estimated Creatinine Clearance: 58.6 mL/min (by C-G formula based on SCr of 1.2 mg/dL). Liver Function Tests: Recent Labs  Lab 08/17/17 2308  AST 21  ALT 14*  ALKPHOS 65  BILITOT 0.8  PROT 6.4*  ALBUMIN 3.2*   No results for input(s): LIPASE, AMYLASE in the last 168 hours. No results for input(s): AMMONIA in the last 168 hours. Coagulation Profile: No results for input(s): INR, PROTIME in the last 168 hours. Cardiac Enzymes: No results for input(s): CKTOTAL, CKMB,  CKMBINDEX, TROPONINI in the last 168 hours. BNP (last 3 results) No results for input(s): PROBNP in the last 8760 hours. HbA1C: No results for input(s): HGBA1C in the last 72 hours. CBG: No results for input(s): GLUCAP in the last 168 hours. Lipid Profile: No results for input(s): CHOL, HDL, LDLCALC, TRIG, CHOLHDL, LDLDIRECT in the last 72 hours. Thyroid Function Tests: No results for input(s): TSH, T4TOTAL, FREET4, T3FREE, THYROIDAB in the last 72 hours. Anemia Panel: No results for input(s): VITAMINB12, FOLATE, FERRITIN, TIBC, IRON, RETICCTPCT in the last 72 hours. Urine analysis: No results found for: COLORURINE, APPEARANCEUR, LABSPEC, PHURINE, GLUCOSEU, HGBUR, BILIRUBINUR, KETONESUR, PROTEINUR, UROBILINOGEN, NITRITE, LEUKOCYTESUR Sepsis Labs: @LABRCNTIP (procalcitonin:4,lacticidven:4) )No results found for this or any previous visit (from the past 240 hour(s)).   Radiological Exams on Admission: Ct Angio Chest Pe W Or Wo Contrast  Result Date: 08/18/2017 CLINICAL DATA:  67 year old male with shortness of breath. Positive D-dimer. Concern for pulmonary embolism. EXAM: CT ANGIOGRAPHY CHEST WITH CONTRAST TECHNIQUE: Multidetector CT imaging of the chest was performed using the standard protocol during bolus administration of intravenous contrast. Multiplanar CT image reconstructions and MIPs were obtained to evaluate the vascular anatomy. CONTRAST:  157m ISOVUE-370 IOPAMIDOL (ISOVUE-370) INJECTION 76% COMPARISON:  Chest radiograph dated 08/17/2017 FINDINGS: Cardiovascular: Mild cardiomegaly. Small pericardial effusion measuring up to 1 cm in thickness. Correlation with echocardiogram recommended. There is mild atherosclerotic calcification of the thoracic aorta. No aneurysmal dilatation. Evaluation of the aorta is limited due to suboptimal opacification and timing of the contrast. There is no CT evidence of pulmonary embolism. Mediastinum/Nodes: Mildly enlarged mediastinal lymph nodes as seen on  the prior CT, of indeterminate clinical significance or etiology. These measure 17 mm in short axis in the AP window and 16 mm in the right paratracheal region. A mildly enlarged lymph node or confluence of lymph node in the subcarinal region appears similar to prior CT and measures approximately 22 mm in short axis. Top-normal right hilar lymph nodes also noted. The esophagus and the thyroid gland are grossly unremarkable. No mediastinal fluid collection. Lungs/Pleura: Diffuse interstitial coarsening and fibrosis as seen previously. There is no consolidative changes. No pleural effusion or pneumothorax. The central airways are patent. Upper Abdomen: No acute abnormality. Musculoskeletal: No chest wall abnormality. No acute or significant osseous findings. Review of the MIP images confirms the above findings. IMPRESSION: 1. No acute intrathoracic pathology.  No CT evidence of pulmonary embolism. 2. Findings of interstitial fibrosis.  No consolidative changes. 3. Mildly enlarged mediastinal and hilar lymph nodes similar to prior CT and of indeterminate clinical significance or etiology, likely reactive. Clinical correlation is recommended. 4. Mild cardiomegaly and small pericardial effusion. The pericardial effusion is new compared to the prior CT. Correlation with clinical exam and echocardiogram recommended. Electronically Signed   By: Anner Crete M.D.   On: 08/18/2017 02:43   Dg Chest Port 1 View  Result Date: 08/17/2017 CLINICAL DATA:  67 year old male with shortness of breath. EXAM: PORTABLE CHEST 1 VIEW COMPARISON:  Chest radiograph dated 05/20/2017 FINDINGS: There is diffuse interstitial coarsening and fibrosis. There is no focal consolidation, pleural effusion, or pneumothorax. Stable cardiac silhouette. No acute osseous pathology. IMPRESSION: 1. No acute cardiopulmonary process. 2. Chronic interstitial fibrosis. Electronically Signed   By: Anner Crete M.D.   On: 08/17/2017 23:55    EKG:  Independently reviewed. Sinus tachycardia (rate 101), LPFB.   Assessment/Plan   1. Acute on chronic hypoxic respiratory failure; ILD; COPD  - Hx of COPD and ILD on 6 Lpm chronically, undergoing eval at [facility] for transplant, now presenting with acute dyspnea and found to be saturating 60% on 6 Lpm by EMS  - Treated with 125 mg IV Solu-Medrol, 2 g IV mag, Duoneb, and placed on NRB prior to arrival in ED  - CTA chest is negative for PE and negative for acute lung findings  - He is afebrile and reports only occasional scant sputum production - Patient reports recent development of bilateral LE edema though none appreciated on exam; BNP is elevated to 940, previously 150  - No acute lung findings on CTA to suggest PNA or ILD flare; concern for possible cardiac etiology and/or COPD exacerbation  - Treated in ED with Lasix 40 mg IV and placed on BiPAP  - Check sputum culture, continue systemic steroids, continue scheduled DuoNeb and prn albuterol nebs, transition back to nasal canula as tolerated, check echocardiogram   2. Pericardial effusion  - Small pericardial effusion noted on CTA, new since CT in February   - No tamponade clinically  - Further eval with transthoracic echo    3. Hypertension  - BP at goal  - Continue Norvasc and losartan as tolerated     DVT prophylaxis: Lovenox Code Status: Full  Family Communication: Discussed with patient Consults called: None Admission status: Inpatient    Vianne Bulls, MD [facility] Pager [phone]  If 7PM-7AM, please contact night-coverage [email or web address] Password [email or web address]  08/18/2017, 4:13 AM

## 2017-08-18 NOTE — ED Notes (Signed)
Patient switched from BiPAP to HiFlo- Respiratory at bedside

## 2017-08-18 NOTE — Progress Notes (Signed)
This is a no charge note.  For further details please see H&P by my partner Dr. Antionette Char from [place] today.  Patient was seen and examined.  He has minimal LE edema but rales at bases, upper lung fields clear.  Still on BiPAP, drops to 83% on HFNC.  APpears dyspneic.  Denies CP, sputum, fever.  Notes leg swelling, slowly worsening O2 reqs over last 2 weeks, as well as new orthopnea.  Now worsening dyspnea. Quit using his CPAP about 2 weeks ago because O2 sats dropped when he took it off. Pulm is at [place], Dr. Armando Reichert.            CHF -Furosemide 40 mg IV once more today -Strict I/Os, daily weights, telemetry  -Daily monitoring renal function -Echo ordered   ILD -Continue solumedrol -Continue nebs sched and PRN -Continue cough syrup  OSA  -Continue CPAP at night at minimum  HTN -Continue amlodipine, losartan  Other medications -Continue trazodone, tramadol, PPI, oxycodone

## 2017-08-19 DIAGNOSIS — J449 Chronic obstructive pulmonary disease, unspecified: Secondary | ICD-10-CM

## 2017-08-19 DIAGNOSIS — I272 Pulmonary hypertension, unspecified: Secondary | ICD-10-CM

## 2017-08-19 DIAGNOSIS — I313 Pericardial effusion (noninflammatory): Secondary | ICD-10-CM

## 2017-08-19 LAB — BASIC METABOLIC PANEL
ANION GAP: 9 (ref 5–15)
BUN: 24 mg/dL — ABNORMAL HIGH (ref 6–20)
CO2: 28 mmol/L (ref 22–32)
Calcium: 9.1 mg/dL (ref 8.9–10.3)
Chloride: 102 mmol/L (ref 101–111)
Creatinine, Ser: 1.24 mg/dL (ref 0.61–1.24)
GFR, EST NON AFRICAN AMERICAN: 59 mL/min — AB (ref >60)
GLUCOSE: 161 mg/dL — AB (ref 65–99)
POTASSIUM: 5.5 mmol/L — AB (ref 3.5–5.1)
Sodium: 139 mmol/L (ref 135–145)

## 2017-08-19 LAB — CBC
HEMATOCRIT: 35.3 % — AB (ref 39.0–52.0)
HEMOGLOBIN: 10.8 g/dL — AB (ref 13.0–17.0)
MCH: 27.3 pg (ref 26.0–34.0)
MCHC: 30.6 g/dL (ref 30.0–36.0)
MCV: 89.1 fL (ref 78.0–100.0)
Platelets: 180 10*3/uL (ref 150–400)
RBC: 3.96 MIL/uL — AB (ref 4.22–5.81)
RDW: 14.1 % (ref 11.5–15.5)
WBC: 16 10*3/uL — ABNORMAL HIGH (ref 4.0–10.5)

## 2017-08-19 LAB — GLUCOSE, CAPILLARY: GLUCOSE-CAPILLARY: 249 mg/dL — AB (ref 65–99)

## 2017-08-19 MED ORDER — METHYLPREDNISOLONE SODIUM SUCC 125 MG IJ SOLR
60.0000 mg | Freq: Two times a day (BID) | INTRAMUSCULAR | Status: DC
  Administered 2017-08-19 – 2017-08-20 (×2): 60 mg via INTRAVENOUS
  Filled 2017-08-19 (×2): qty 2

## 2017-08-19 MED ORDER — FUROSEMIDE 10 MG/ML IJ SOLN
40.0000 mg | Freq: Two times a day (BID) | INTRAMUSCULAR | Status: DC
  Administered 2017-08-19 – 2017-08-20 (×3): 40 mg via INTRAVENOUS
  Filled 2017-08-19 (×3): qty 4

## 2017-08-19 MED ORDER — IPRATROPIUM-ALBUTEROL 0.5-2.5 (3) MG/3ML IN SOLN
3.0000 mL | Freq: Three times a day (TID) | RESPIRATORY_TRACT | Status: DC
  Administered 2017-08-19 – 2017-08-23 (×13): 3 mL via RESPIRATORY_TRACT
  Filled 2017-08-19 (×13): qty 3

## 2017-08-19 NOTE — Plan of Care (Signed)
Patient is very open to learning. This RN discussed with the patient about his added medication of lasix and the purpose for taking medication. He verbalizes understanding about why he is taking lasix and reports he has no questions about it. Will continue to monitor and educate patient.

## 2017-08-19 NOTE — Progress Notes (Signed)
Initial Nutrition Assessment  DOCUMENTATION CODES:   Not applicable  INTERVENTION:   - Provided diet education  - Encourage PO intake  NUTRITION DIAGNOSIS:   Increased nutrient needs related to chronic illness(COPD) as evidenced by estimated needs.  GOAL:   Patient will meet greater than or equal to 90% of their needs  MONITOR:   PO intake, Weight trends, Labs   REASON FOR ASSESSMENT:   Malnutrition Screening Tool(history of PCM)    ASSESSMENT:   67 year old male with PMH significant for interstitial lung disease, COPD, hypertension, CHF, and chronic hypoxic respiratory failure who presented to ED with acute shortness of breath.  Patient reports that he been in his usual state of health until he developed acute worsening in his chronic dyspnea.  Spoke with pt and family members at bedside. Pt reports good appetite during current admission. Noted completed lunch meal tray in room at time of visit (~100%).  Pt reports having a poor appetite PTA due to feeling sick. Pt reports "not eating much, just small portions." Per pt's wife, this is not his baseline.  Pt reports his highest weight as 220 lbs in 2015. Pt states that he lost weight intentionally by cutting out sodas and other sugar-sweetened beverages. Pt reports his UBW as 195 lbs. Pt reports unintentional weight loss recently "down to 160 lbs." Pt has gained weight since this time and weighs approximately 171 lbs today. Pt has gained weight since his previous admission in February when he was diagnosed with moderate protein-calorie malnutrition. Pt still had muscle depletions. RD suspects these are related to lack of physical activity (pt sits in chair most of day).  Pt typically eats 3 meals daily. Breakfast includes donuts and peanut butter cracks. Lunch includes chicken salad and a banana. Dinner includes something that pt's wife makes. Pt states he drinks Boost daily.  Pt and family members requested diet education. RD  provided and explained "Heart Failure Nutrition Therapy" handout from the Academy of [employer]etics.  Medications reviewed and include: 40 mg Lasix BID, MVI with minerals daily, 40 mg Protonix daily  Labs reviewed: potassium 5.5 (H), BUN 24 (H), hemoglobin 10.8 (L), HCT 35.3 (L) CBG: 249 (today)  NUTRITION - FOCUSED PHYSICAL EXAM:    Most Recent Value  Orbital Region  No depletion  Upper Arm Region  Mild depletion  Thoracic and Lumbar Region  No depletion  Buccal Region  No depletion  Temple Region  Mild depletion  Clavicle Bone Region  Moderate depletion  Clavicle and Acromion Bone Region  Moderate depletion  Scapular Bone Region  Unable to assess  Dorsal Hand  No depletion  Patellar Region  Moderate depletion  Anterior Thigh Region  Moderate depletion  Posterior Calf Region  Moderate depletion  Edema (RD Assessment)  None  Hair  Reviewed  Eyes  Reviewed  Mouth  Reviewed  Skin  Reviewed  Nails  Reviewed    Per pt, he is sedentary at baseline due to illness. RD suspects muscle wasting in legs related to sedentary lifestyle.   Diet Order:   Diet Order           Diet Heart Room service appropriate? Yes; Fluid consistency: Thin  Diet effective now          EDUCATION NEEDS:   Education needs have been addressed  Skin:  Skin Assessment: Reviewed RN Assessment  Last BM:  08/17/17  Height:   Ht Readings from Last 1 Encounters:  08/17/17  (1.727 m)  Weight:   Wt Readings from Last 1 Encounters:  08/19/17 171 lb 4.8 oz (77.7 kg)    Ideal Body Weight:  70 kg  BMI:  Body mass index is 26.05 kg/m.  Estimated Nutritional Needs:   Kcal:  2250-2450 kcal/day  Protein:  115-130 grams/day  Fluid:  2.2-2.4 L/day    Earma Reading, MS, RD, LDN Pager: [phone] Weekend/After Hours: [phone]

## 2017-08-19 NOTE — Consult Note (Addendum)
Cardiology Consultation:   Patient ID: Jose Flores; [id number]; 02-01-1951   Admit date: 08/17/2017 Date of Consult: 08/19/2017  Primary Care Provider: [facility] Primary Cardiologist: New to [facility]  Patient Profile:   Jose Flores is a 67 y.o. male with a hx of chronic interstitial lung disease,  COPD, chronic hyporxic respiratory failure on 6 L oxygen and  HTN, who is being seen today for the evaluation of pericardiac effusion at the request of Dr. Maryfrances Bunnell.   Remotely seen by Dr. Elsie Lincoln for chest pain. No evidence of CAD by angiography. Has normal stress test in 2018 at [place]. No evidence of R sided elevated pressure by RHC at [facility] 09/2016.  Remote hx of 10 pack year tobacco smoking. Quit 27 years ago. 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 Father had CABG in his 19s.   History of Present Illness:   [name] presented yesterday with worsening dyspnea on exertion, orthopnea, hypoxia and ankle swelling. EMS found him with oxygen saturation of 60% on his 6L oxygen. Treated with IV solu-medrol, DuoNebm, IV magnesium and nonrebreather. Given lasix for elevated BNP of 940. D-dimer of 1.4. Follow up CTA of chest did not showed PE however showed small pericardial effusion (new compared to prior CT). Started on BiPAP and IV lasix and admitted for further evaluation. Diuresed 1.9L so far.   Echo showed LVEF of 60-65% with grade 1DD. There is severe reduction in systolic function of RV with moderate to severe dilation.  PA pressure of 76 mm Hg. Moderate to large posterior pericardiac effusion. No sign of Tamponade. Cardiology is asked for further management. Vitals relatively stable.   He has chronic early satiety. Recently loss weight. Chronic orthopnea. No chest pain, palpitation, dizziness or syncope.   Past Medical History:  Diagnosis Date  . Acid reflux   . COPD (chronic obstructive pulmonary disease) (HCC)   . Hypertension   . Insomnia   . Pulmonary fibrosis (HCC)      Inpatient Medications: Scheduled Meds: . DULoxetine  30 mg Oral Daily  . enoxaparin (LOVENOX) injection  40 mg Subcutaneous Q24H  . fluticasone  1 spray Each Nare Daily  . furosemide  40 mg Intravenous BID  . ipratropium-albuterol  3 mL Nebulization TID  . losartan  100 mg Oral Daily  . methylPREDNISolone (SOLU-MEDROL) injection  60 mg Intravenous Q12H  . multivitamin with minerals  1 tablet Oral Daily  . pantoprazole  40 mg Oral Daily  . sodium chloride flush  3 mL Intravenous Q12H  . sodium chloride flush  3 mL Intravenous Q12H  . traZODone  50 mg Oral QHS   Continuous Infusions: . sodium chloride     PRN Meds: sodium chloride, acetaminophen **OR** acetaminophen, albuterol, bisacodyl, ondansetron **OR** ondansetron (ZOFRAN) IV, oxyCODONE, polyvinyl alcohol, senna-docusate, sodium chloride flush, traMADol  Allergies:    Allergies  Allergen Reactions  . Gabapentin Other (See Comments)    [facility]  . Lisinopril Other (See Comments)    [facility]  . Penicillins Other (See Comments)    Further details unknown per pt. Has patient had a PCN reaction causing immediate rash, facial/tongue/throat swelling, SOB or lightheadedness with hypotension: Unknown Has patient had a PCN reaction causing severe rash involving mucus membranes or skin necrosis: Unknown Has patient had a PCN reaction that required hospitalization: Yes Has patient had a PCN reaction occurring within the last 10 years: Unknown If all of the above answers are "NO", then may proceed with Cephalosporin use.  Social History:   Social History   Socioeconomic History  . Marital status: Married    Spouse name: Not on file  . Number of children: Not on file  . Years of education: Not on file  . Highest education level: Not on file  Occupational History  . Not on file  Social Needs  . Financial resource strain: Not on file  . Food insecurity:    Worry: Not on file    Inability: Not on file  .  Transportation needs:    Medical: Not on file    Non-medical: Not on file  Tobacco Use  . Smoking status: Former Games developer  . Smokeless tobacco: Never Used  Substance and Sexual Activity  . Alcohol use: Yes    Comment: occ  . Drug use: No  . Sexual activity: Not on file  Lifestyle  . Physical activity:    Days per week: Not on file    Minutes per session: Not on file  . Stress: Not on file  Relationships  . Social connections:    Talks on phone: Not on file    Gets together: Not on file    Attends religious service: Not on file    Active member of club or organization: Not on file    Attends meetings of clubs or organizations: Not on file    Relationship status: Not on file  . Intimate partner violence:    Fear of current or ex partner: Not on file    Emotionally abused: Not on file    Physically abused: Not on file    Forced sexual activity: Not on file  Other Topics Concern  . Not on file  Social History Narrative  . Not on file    Family History:   Family History  Problem Relation Age of Onset  . Colon cancer Mother   . Heart failure Father   . Breast cancer Sister      ROS:  Please see the history of present illness.  All other ROS reviewed and negative.     Physical Exam/Data:   Vitals:   08/19/17 0500 08/19/17 0654 08/19/17 0854 08/19/17 0920  BP:    111/71  Pulse:    92  Resp:    (!) 21  Temp:    97.6 F (36.4 C)  TempSrc:    Oral  SpO2:   90% 92%  Weight: 172 lb 6.4 oz (78.2 kg) 171 lb 4.8 oz (77.7 kg)    Height:        Intake/Output Summary (Last 24 hours) at 08/19/2017 1152 Last data filed at 08/18/2017 2043 Gross per 24 hour  Intake -  Output 750 ml  Net -750 ml   Filed Weights   08/17/17 2300 08/19/17 0500 08/19/17 0654  Weight: 160 lb (72.6 kg) 172 lb 6.4 oz (78.2 kg) 171 lb 4.8 oz (77.7 kg)   Body mass index is 26.05 kg/m.  General:  Well nourished, well developed, in no acute distress HEENT: normal Lymph: no adenopathy Neck: +  JVD Endocrine:  No thryomegaly Vascular: No carotid bruits; FA pulses 2+ bilaterally without bruits  Cardiac:  normal S1, S2; RRR; no murmur  Lungs:  clear to auscultation bilaterally, no wheezing, rhonchi or rales  Abd: soft, nontender, no hepatomegaly. Distended abdomen  Ext:  Trace ankle edema Musculoskeletal:  No deformities, BUE and BLE strength normal and equal Skin: warm and dry  Neuro:  CNs 2-12 intact, no focal abnormalities noted Psych:  Normal  affect   EKG:  The EKG was personally reviewed and demonstrates:  Sinus tachycardia at rate of 101 bpm Telemetry:  Telemetry was personally reviewed and demonstrates:  Sinus rhythm at 60-90s  Relevant CV Studies: Study Conclusions  - Left ventricle: The cavity size was normal. Systolic function was   normal. The estimated ejection fraction was in the range of 60%   to 65%. Wall motion was normal; there were no regional wall   motion abnormalities. Doppler parameters are consistent with   abnormal left ventricular relaxation (grade 1 diastolic   dysfunction). - Ventricular septum: The contour showed systolic flattening. These   changes are consistent with RV pressure overload. - Aortic valve: Valve area (VTI): 2.63 cm^2. Valve area (Vmax):   2.58 cm^2. Valve area (Vmean): 2.4 cm^2. - Right ventricle: The cavity size was moderately to severely   dilated. Wall thickness was normal. Systolic function was   severely reduced. - Right atrium: The atrium was moderately to severely dilated. - Tricuspid valve: There was moderate regurgitation. - Pulmonary arteries: Systolic pressure was moderately to severely   increased. PA peak pressure: 76 mm Hg (S). - Pericardium, extracardiac: moderate to large loculated posterior   pericardial effusion. THe IVC does not collapse, but the flow   across TV is phasic and suggest tamponade not present.   There was no chamber collapse. Respirophasic change in stroke   volume was normal.  Laboratory  Data:  Chemistry Recent Labs  Lab 08/17/17 2308 08/19/17 0317  NA 135 139  K 3.9 5.5*  CL 103 102  CO2 23 28  GLUCOSE 155* 161*  BUN 14 24*  CREATININE 1.20 1.24  CALCIUM 8.5* 9.1  GFRNONAA >60 59*  GFRAA >60 >60  ANIONGAP 9 9    Recent Labs  Lab 08/17/17 2308  PROT 6.4*  ALBUMIN 3.2*  AST 21  ALT 14*  ALKPHOS 65  BILITOT 0.8   Hematology Recent Labs  Lab 08/17/17 2308 08/19/17 0317  WBC 13.1* 16.0*  RBC 4.07* 3.96*  HGB 11.3* 10.8*  HCT 36.4* 35.3*  MCV 89.4 89.1  MCH 27.8 27.3  MCHC 31.0 30.6  RDW 14.0 14.1  PLT 203 180   Recent Labs  Lab 08/17/17 2311  TROPIPOC 0.03    BNP Recent Labs  Lab 08/17/17 2304  BNP 940.1*    DDimer  Recent Labs  Lab 08/17/17 2308  DDIMER 1.40*    Radiology/Studies:  Ct Angio Chest Pe W Or Wo Contrast  Result Date: 08/18/2017 CLINICAL DATA:  67 year old male with shortness of breath. Positive D-dimer. Concern for pulmonary embolism. EXAM: CT ANGIOGRAPHY CHEST WITH CONTRAST TECHNIQUE: Multidetector CT imaging of the chest was performed using the standard protocol during bolus administration of intravenous contrast. Multiplanar CT image reconstructions and MIPs were obtained to evaluate the vascular anatomy. CONTRAST:  ISOVUE-370 IOPAMIDOL (ISOVUE-370) INJECTION 76% COMPARISON:  Chest radiograph dated 08/17/2017 FINDINGS: Cardiovascular: Mild cardiomegaly. Small pericardial effusion measuring up to 1 cm in thickness. Correlation with echocardiogram recommended. There is mild atherosclerotic calcification of the thoracic aorta. No aneurysmal dilatation. Evaluation of the aorta is limited due to suboptimal opacification and timing of the contrast. There is no CT evidence of pulmonary embolism. Mediastinum/Nodes: Mildly enlarged mediastinal lymph nodes as seen on the prior CT, of indeterminate clinical significance or etiology. These measure 17 mm in short axis in the AP window and 16 mm in the right paratracheal region. A  mildly enlarged lymph node or confluence of lymph node in the  subcarinal region appears similar to prior CT and measures approximately 22 mm in short axis. Top-normal right hilar lymph nodes also noted. The esophagus and the thyroid gland are grossly unremarkable. No mediastinal fluid collection. Lungs/Pleura: Diffuse interstitial coarsening and fibrosis as seen previously. There is no consolidative changes. No pleural effusion or pneumothorax. The central airways are patent. Upper Abdomen: No acute abnormality. Musculoskeletal: No chest wall abnormality. No acute or significant osseous findings. Review of the MIP images confirms the above findings. IMPRESSION: 1. No acute intrathoracic pathology. No CT evidence of pulmonary embolism. 2. Findings of interstitial fibrosis.  No consolidative changes. 3. Mildly enlarged mediastinal and hilar lymph nodes similar to prior CT and of indeterminate clinical significance or etiology, likely reactive. Clinical correlation is recommended. 4. Mild cardiomegaly and small pericardial effusion. The pericardial effusion is new compared to the prior CT. Correlation with clinical exam and echocardiogram recommended. Electronically Signed   By: Elgie Collard M.D.   On: 08/18/2017 02:43   Dg Chest Port 1 View  Result Date: 08/17/2017 CLINICAL DATA:  67 year old male with shortness of breath. EXAM: PORTABLE CHEST 1 VIEW COMPARISON:  Chest radiograph dated 05/20/2017 FINDINGS: There is diffuse interstitial coarsening and fibrosis. There is no focal consolidation, pleural effusion, or pneumothorax. Stable cardiac silhouette. No acute osseous pathology. IMPRESSION: 1. No acute cardiopulmonary process. 2. Chronic interstitial fibrosis. Electronically Signed   By: Elgie Collard M.D.   On: 08/17/2017 23:55    Assessment and Plan:   1. Pericardial effusion - Small pericardial effusion by CT of chest (new). Follow up echo showed moderate to large loculated posterior pericardial  effusion. No evidence of tamponade. Vital sign table.  2. Acute Right sided heart failure with severe pulmonary hypertension/acute diastolic CHF - Per patient, [facility] showed [facility].  - Echo this admission showed LVEF of 60-65% with grade 1DD. There is severe reduction in systolic function of RV with moderate to severe dilation.  PA pressure of 76 mm Hg.  - Net I & O negative 1.9L. Weight down 1 lb. Scr stable continue diuresis at IV lasix 40mg  BID.   3. HTN - BP stable on losartan  4. Acute on chronic hypoxic respiratory failure/interstitial lung disease  - per primary team   For questions or updates, please contact [facility] Please consult [email or web address] for contact info under Cardiology/STEMI.   Lorelei Pont, [place]  08/19/2017 11:52 AM   Attending Note:  The patient was seen and examined.  Agree with assessment and plan as noted above.  Changes made to the above note as needed.  Patient seen and independently examined with Vin bhagat, PA .   We discussed all aspects of the encounter. I agree with the assessment and plan as stated above.  1.  Pericardial effusion:   Moderate by echo.   No evidence of tamponade.  He has improved with IV lasix.  Would continue to diurese. Would not pursue pericardiocentesis at this time   2. Severe pulmonary HTN:   This is most likely due to to his pulmonary fibrosis and is likely irreversible. He had a right heart cath at the [facility]. I've called and left a message to have them call me so that we can get a copy of that report   I suspect that this is a irreversible process and would not respond to vasodilators     I have spent a total of 40 minutes with patient reviewing hospital  notes , telemetry, EKGs, labs  and examining patient as well as establishing an assessment and plan that was discussed with the patient. > 50% of time was spent in direct patient care.    Vesta Mixer, Montez Hageman., MD, [facility] 08/19/2017,  1:45 PM [address]  [address] Office [phone] Pager [phone]

## 2017-08-19 NOTE — Progress Notes (Signed)
PROGRESS NOTE    CHIP CANEPA  ZOX:[id number] DOB: [birth year] DOA: 08/17/2017 PCP: [facility]      Brief Narrative:  [name] is a 67 y.o. M with interstitial lung disease on 6-10L O2 by HFNC, COPD, chronic right heart failure and HTN who presents with several weeks progressive ankle swelling, worsening dyspnea on exertion, incraesed O2 needs, dry cough, and orthopnea.  Denied fevers, chills, or chest pain.  Called EMS the night of admission for worse dyspnea and was found to be saturating 60% on his usual 6 L/min of supplemental oxygen.  He was placed on nonrebreather and treated with 125 mg IV Solu-Medrol, 2 g IV magnesium, and DuoNeb prior to arrival in the ED.  In ER, CTA showed no consolidation or PE, BNP was elevated and he was started on Lasix and admitted to hospitalist service.     Assessment & Plan:  Acute right sided heart failure Echo shows normal LV, EF 60%, RVSF decreased, grade I DD.  Cr stable, 1.2L out yesterday. -Continue IV furosemide today -Strict I/Os, daily weights, telemetry  -Daily monitoring renal function    Loculated pericardial effusion -Consult to Cardiology  Hyperkalemia Mild -Monitor K daily  Interstitial lung disease CT shows stable ILD, no new consolidation or PE.  Patient states he is not a transplant candidate. -Continue empiric Solu-Medrol 60 mg, taper down to twice daily -Continue nebs sched and PRN -Continue cough syrup -Titrate HFNC as able to baseline (6L at rest, 10L with exertion) -Goal SpO2 88-92% only  OSA  -Continue CPAP at night at minimum  HTN Blood pressure soft -Hold amlodipine -Continue losartan    Other medications -Continue  duloxetine, trazodone, PPI  Normocytic anemia Anemia of chornic disease.  Stable, no signs fo bleeding.      DVT prophylaxis: Lovenox Code Status: FULL Family Communication: None persent MDM and disposition Plan: The below labs and imaging reports were reviewed  and summarized above.  The patient was admitted with acute on chronic hypoxic respiratory failure from CHF in the setting of interstitial lung disease and right-sided heart failure.  He has been diuresed and is improving.  His echocardiogram now shows a new loculated pleural effusion and he has some deltoid abnormalities which were treating.      Consultants:   Radiology  Procedures:   Cardiogram  Antimicrobials:   None   Subjective: Patient feels that his breathing is minimally better.  He has not been out of bed to see if his exertional tolerance is improved.  His leg swelling slightly better.  He has no chest pain, no new fever, sputum production.  No dizziness or confusion.  Objective: Vitals:   08/19/17 0011 08/19/17 0341 08/19/17 0500 08/19/17 0654  BP: 102/60 102/64    Pulse: 80 70    Resp: 17 (!) 22    Temp: 98.1 F (36.7 C) 98 F (36.7 C)    TempSrc: Oral Oral    SpO2: 99% 100%    Weight:   78.2 kg (172 lb 6.4 oz) 77.7 kg (171 lb 4.8 oz)  Height:        Intake/Output Summary (Last 24 hours) at 08/19/2017 0846 Last data filed at 08/18/2017 2043 Gross per 24 hour  Intake -  Output 1200 ml  Net -1200 ml   Filed Weights   08/17/17 2300 08/19/17 0500 08/19/17 0654  Weight: 72.6 kg (160 lb) 78.2 kg (172 lb 6.4 oz) 77.7 kg (171 lb 4.8 oz)    Examination: General  appearance: Elderly adult male, alert and in no acute distress.   Sitting in bed, O2 by HFNC in place. HEENT: Anicteric, conjunctiva pink, lids and lashes normal. No nasal deformity, discharge, epistaxis.  Lips moist, dentures in place.  OP moist without lesions.   Skin: Warm and dry.  No jaundice.  No suspicious rashes or lesions. Cardiac: Tachycardic, regular, nl S1-S2, no murmurs appreciated.  Capillary refill is brisk.  JVP elevated.  Trace pretibial LE edema.  Radial and DP pulses 2+ and symmetric. Respiratory: Tachypneic, O2 saturation drops just with talking and he sounds dyspneic.  Rales at bases, no  wheezes. Abdomen: Abdomen soft.  No TTP. No ascites, distension, hepatosplenomegaly.   MSK: No deformities or effusions. Neuro: Awake and alert.  EOMI, moves all extremities. Speech fluent.    Psych: Sensorium intact and responding to questions, attention normal. Affect normal.  Judgment and insight appear normal.    Data Reviewed: I have personally reviewed following labs and imaging studies:  CBC: Recent Labs  Lab 08/17/17 2308 08/19/17 0317  WBC 13.1* 16.0*  NEUTROABS 10.0*  --   HGB 11.3* 10.8*  HCT 36.4* 35.3*  MCV 89.4 89.1  PLT 203 180   Basic Metabolic Panel: Recent Labs  Lab 08/17/17 2308 08/19/17 0317  NA 135 139  K 3.9 5.5*  CL 103 102  CO2 23 28  GLUCOSE 155* 161*  BUN 14 24*  CREATININE 1.20 1.24  CALCIUM 8.5* 9.1   GFR: Estimated Creatinine Clearance: 56.7 mL/min (by C-G formula based on SCr of 1.24 mg/dL). Liver Function Tests: Recent Labs  Lab 08/17/17 2308  AST 21  ALT 14*  ALKPHOS 65  BILITOT 0.8  PROT 6.4*  ALBUMIN 3.2*   No results for input(s): LIPASE, AMYLASE in the last 168 hours. No results for input(s): AMMONIA in the last 168 hours. Coagulation Profile: No results for input(s): INR, PROTIME in the last 168 hours. Cardiac Enzymes: No results for input(s): CKTOTAL, CKMB, CKMBINDEX, TROPONINI in the last 168 hours. BNP (last 3 results) No results for input(s): PROBNP in the last 8760 hours. HbA1C: No results for input(s): HGBA1C in the last 72 hours. CBG: No results for input(s): GLUCAP in the last 168 hours. Lipid Profile: No results for input(s): CHOL, HDL, LDLCALC, TRIG, CHOLHDL, LDLDIRECT in the last 72 hours. Thyroid Function Tests: No results for input(s): TSH, T4TOTAL, FREET4, T3FREE, THYROIDAB in the last 72 hours. Anemia Panel: No results for input(s): VITAMINB12, FOLATE, FERRITIN, TIBC, IRON, RETICCTPCT in the last 72 hours. Urine analysis: No results found for: COLORURINE, APPEARANCEUR, LABSPEC, PHURINE, GLUCOSEU,  HGBUR, BILIRUBINUR, KETONESUR, PROTEINUR, UROBILINOGEN, NITRITE, LEUKOCYTESUR Sepsis Labs: (procalcitonin:4,lacticacidven:4)  ) Recent Results (from the past 240 hour(s))  MRSA PCR Screening     Status: None   Collection Time: 08/18/17  4:50 PM  Result Value Ref Range Status   MRSA by PCR NEGATIVE NEGATIVE Final    Comment:        The GeneXpert MRSA Assay (FDA approved for NASAL specimens only), is one component of a comprehensive MRSA colonization surveillance program. It is not intended to diagnose MRSA infection nor to guide or monitor treatment for MRSA infections. Performed at [facility], [address] [place], [place] [address]          Radiology Studies: Ct Angio Chest Pe W Or Wo Contrast  Result Date: 08/18/2017 CLINICAL DATA:  67 year old male with shortness of breath. Positive D-dimer. Concern for pulmonary embolism. EXAM: CT ANGIOGRAPHY CHEST WITH CONTRAST  TECHNIQUE: Multidetector CT imaging of the chest was performed using the standard protocol during bolus administration of intravenous contrast. Multiplanar CT image reconstructions and MIPs were obtained to evaluate the vascular anatomy. CONTRAST:  ISOVUE-370 IOPAMIDOL (ISOVUE-370) INJECTION 76% COMPARISON:  Chest radiograph dated 08/17/2017 FINDINGS: Cardiovascular: Mild cardiomegaly. Small pericardial effusion measuring up to 1 cm in thickness. Correlation with echocardiogram recommended. There is mild atherosclerotic calcification of the thoracic aorta. No aneurysmal dilatation. Evaluation of the aorta is limited due to suboptimal opacification and timing of the contrast. There is no CT evidence of pulmonary embolism. Mediastinum/Nodes: Mildly enlarged mediastinal lymph nodes as seen on the prior CT, of indeterminate clinical significance or etiology. These measure 17 mm in short axis in the AP window and 16 mm in the right paratracheal region. A mildly enlarged lymph node or confluence of lymph  node in the subcarinal region appears similar to prior CT and measures approximately 22 mm in short axis. Top-normal right hilar lymph nodes also noted. The esophagus and the thyroid gland are grossly unremarkable. No mediastinal fluid collection. Lungs/Pleura: Diffuse interstitial coarsening and fibrosis as seen previously. There is no consolidative changes. No pleural effusion or pneumothorax. The central airways are patent. Upper Abdomen: No acute abnormality. Musculoskeletal: No chest wall abnormality. No acute or significant osseous findings. Review of the MIP images confirms the above findings. IMPRESSION: 1. No acute intrathoracic pathology. No CT evidence of pulmonary embolism. 2. Findings of interstitial fibrosis.  No consolidative changes. 3. Mildly enlarged mediastinal and hilar lymph nodes similar to prior CT and of indeterminate clinical significance or etiology, likely reactive. Clinical correlation is recommended. 4. Mild cardiomegaly and small pericardial effusion. The pericardial effusion is new compared to the prior CT. Correlation with clinical exam and echocardiogram recommended. Electronically Signed   By: Elgie Collard M.D.   On: 08/18/2017 02:43   Dg Chest Port 1 View  Result Date: 08/17/2017 CLINICAL DATA:  67 year old male with shortness of breath. EXAM: PORTABLE CHEST 1 VIEW COMPARISON:  Chest radiograph dated 05/20/2017 FINDINGS: There is diffuse interstitial coarsening and fibrosis. There is no focal consolidation, pleural effusion, or pneumothorax. Stable cardiac silhouette. No acute osseous pathology. IMPRESSION: 1. No acute cardiopulmonary process. 2. Chronic interstitial fibrosis. Electronically Signed   By: Elgie Collard M.D.   On: 08/17/2017 23:55        Scheduled Meds: . DULoxetine  30 mg Oral Daily  . enoxaparin (LOVENOX) injection  40 mg Subcutaneous Q24H  . fluticasone  1 spray Each Nare Daily  . furosemide  40 mg Intravenous BID  . ipratropium-albuterol  3  mL Nebulization TID  . losartan  100 mg Oral Daily  . methylPREDNISolone (SOLU-MEDROL) injection  60 mg Intravenous Q8H  . multivitamin with minerals  1 tablet Oral Daily  . pantoprazole  40 mg Oral Daily  . sodium chloride flush  3 mL Intravenous Q12H  . sodium chloride flush  3 mL Intravenous Q12H  . traZODone  50 mg Oral QHS   Continuous Infusions: . sodium chloride       LOS: 1 day    Time spent: 25 minutes    Alberteen Sam, MD [facility] 08/19/2017, 8:46 AM     Pager [phone] --- please page though AMION:  [email or web address] Password [email or web address] If 7PM-7AM, please contact night-coverage

## 2017-08-20 ENCOUNTER — Other Ambulatory Visit: Payer: Self-pay

## 2017-08-20 DIAGNOSIS — I50813 Acute on chronic right heart failure: Secondary | ICD-10-CM

## 2017-08-20 DIAGNOSIS — J9621 Acute and chronic respiratory failure with hypoxia: Secondary | ICD-10-CM

## 2017-08-20 LAB — BASIC METABOLIC PANEL
ANION GAP: 8 (ref 5–15)
BUN: 29 mg/dL — ABNORMAL HIGH (ref 6–20)
CHLORIDE: 98 mmol/L — AB (ref 101–111)
CO2: 31 mmol/L (ref 22–32)
Calcium: 9 mg/dL (ref 8.9–10.3)
Creatinine, Ser: 1.22 mg/dL (ref 0.61–1.24)
GFR calc Af Amer: >60 mL/min (ref >60)
Glucose, Bld: 154 mg/dL — ABNORMAL HIGH (ref 65–99)
POTASSIUM: 4.7 mmol/L (ref 3.5–5.1)
SODIUM: 137 mmol/L (ref 135–145)

## 2017-08-20 LAB — APTT: aPTT: 32 seconds (ref 24–36)

## 2017-08-20 LAB — CBC
HEMATOCRIT: 36.7 % — AB (ref 39.0–52.0)
HEMOGLOBIN: 11.4 g/dL — AB (ref 13.0–17.0)
MCH: 27.9 pg (ref 26.0–34.0)
MCHC: 31.1 g/dL (ref 30.0–36.0)
MCV: 89.7 fL (ref 78.0–100.0)
Platelets: 199 10*3/uL (ref 150–400)
RBC: 4.09 MIL/uL — AB (ref 4.22–5.81)
RDW: 14.2 % (ref 11.5–15.5)
WBC: 17 10*3/uL — AB (ref 4.0–10.5)

## 2017-08-20 LAB — PROTIME-INR
INR: 1.14
PROTHROMBIN TIME: 14.6 s (ref 11.4–15.2)

## 2017-08-20 MED ORDER — METHYLPREDNISOLONE SODIUM SUCC 40 MG IJ SOLR
40.0000 mg | Freq: Two times a day (BID) | INTRAMUSCULAR | Status: DC
  Administered 2017-08-20: 40 mg via INTRAVENOUS
  Filled 2017-08-20: qty 1

## 2017-08-20 NOTE — Progress Notes (Signed)
Patient declined CPAP at this time no distress noted, stated he was uncomfortable wearing it tonight.

## 2017-08-20 NOTE — Progress Notes (Signed)
Patient does not wish to use CPAP tonight, no distress noted RCP will continue to monitor.

## 2017-08-20 NOTE — Progress Notes (Signed)
Progress Note  Patient Name: Jose Flores Date of Encounter: 08/20/2017  Primary Cardiologist: [facility],   Tichina Koebel / Bensimhon at [facility]  Subjective   67 year old gentleman with a history of chronic renal cysts rule lung disease, COPD.  He is admitted with pericardial effusion and pulmonary hypertension.  He is diuresed nicely.  He is breathing a little bit better. Echocardiogram yesterday reveals severe pulmonary artery hypertension with an estimated PA pressure of 76 mm.  There is a moderate to large pericardial effusion.  Inpatient Medications    Scheduled Meds: . DULoxetine  30 mg Oral Daily  . enoxaparin (LOVENOX) injection  40 mg Subcutaneous Q24H  . fluticasone  1 spray Each Nare Daily  . furosemide  40 mg Intravenous BID  . ipratropium-albuterol  3 mL Nebulization TID  . losartan  100 mg Oral Daily  . methylPREDNISolone (SOLU-MEDROL) injection  60 mg Intravenous Q12H  . multivitamin with minerals  1 tablet Oral Daily  . pantoprazole  40 mg Oral Daily  . sodium chloride flush  3 mL Intravenous Q12H  . sodium chloride flush  3 mL Intravenous Q12H  . traZODone  50 mg Oral QHS   Continuous Infusions: . sodium chloride     PRN Meds: sodium chloride, acetaminophen **OR** acetaminophen, albuterol, bisacodyl, ondansetron **OR** ondansetron (ZOFRAN) IV, oxyCODONE, polyvinyl alcohol, senna-docusate, sodium chloride flush, traMADol   Vital Signs    Vitals:   08/19/17 2340 08/20/17 0353 08/20/17 0744 08/20/17 0903  BP: 106/66 113/71 134/76   Pulse: 73 61 65   Resp: (!) Temp: 97.7 F (36.5 C)  97.6 F (36.4 C)   TempSrc: Oral  Oral   SpO2: 97% 99% 98% 97%  Weight:  169 lb 1.5 oz (76.7 kg)    Height:        Intake/Output Summary (Last 24 hours) at 08/20/2017 1150 Last data filed at 08/19/2017 1930 Gross per 24 hour  Intake 200 ml  Output 2025 ml  Net -1825 ml   Filed Weights   08/19/17 0500 08/19/17 0654 08/20/17 0353  Weight: 172 lb 6.4 oz (78.2 kg)  171 lb 4.8 oz (77.7 kg) 169 lb 1.5 oz (76.7 kg)    Telemetry    NSR  - Personally Reviewed  ECG     NSR  - Personally Reviewed  Physical Exam   GEN:  Chronically ill-appearing gentleman.  No acute distress.  He is on high flow  oxygen. Neck: No JVD Cardiac:  Regular rate S1-S2.  Soft systolic murmur. Respiratory:  Fine rales bilaterally-especially in the bases. GI: Soft, nontender, non-distended  MS: No edema; No deformity. Neuro:  Nonfocal  Psych: Normal affect   Labs    Chemistry Recent Labs  Lab 08/17/17 2308 08/19/17 0317 08/20/17 0244  NA 135 139 137  K 3.9 5.5* 4.7  CL 103 102 98*  CO2 GLUCOSE 155* 161* 154*  BUN 14 24* 29*  CREATININE 1.20 1.24 1.22  CALCIUM 8.5* 9.1 9.0  PROT 6.4*  --   --   ALBUMIN 3.2*  --   --   AST 21  --   --   ALT 14*  --   --   ALKPHOS 65  --   --   BILITOT 0.8  --   --   GFRNONAA >60 59* >60  GFRAA >60 >60 >60  ANIONGAP Hematology Recent Labs  Lab 08/17/17 2308 08/19/17  1610 08/20/17 0244  WBC 13.1* 16.0* 17.0*  RBC 4.07* 3.96* 4.09*  HGB 11.3* 10.8* 11.4*  HCT 36.4* 35.3* 36.7*  MCV 89.4 89.1 89.7  MCH 27.8 27.3 27.9  MCHC 31.0 30.6 31.1  RDW 14.0 14.1 14.2  PLT 203 180 199    Cardiac EnzymesNo results for input(s): TROPONINI in the last 168 hours.  Recent Labs  Lab 08/17/17 2311  TROPIPOC 0.03     BNP Recent Labs  Lab 08/17/17 2304  BNP 940.1*     DDimer  Recent Labs  Lab 08/17/17 2308  DDIMER 1.40*     Radiology    No results found.  Cardiac Studies    Patient Profile     67 y.o. male with interstitial lung disease and pulmonary hypertension.  Assessment & Plan    1.  Pulmonary hypertension: The patient is feeling better after diuresis. He is net -3.7 L so far. I talked to Dr. Gala Romney.   He will perform a right heart catheterization on Monday. I discussed the risks, benefits, options of right heart catheterization.  The patient understands and agrees to have  the procedure.  2.  Pericardial effusion: Patient has a moderate to large posterior pericardial effusion.  There is no evidence of tamponade.   For questions or updates, please contact [facility] Please consult [email or web address] for contact info under Cardiology/STEMI.      Signed, Kristeen Miss, MD  08/20/2017, 11:50 AM

## 2017-08-20 NOTE — Progress Notes (Signed)
PROGRESS NOTE    Jose Flores  ZOX:[id number] DOB: [birth year] DOA: 08/17/2017 PCP: [facility]      Brief Narrative:  Jose Flores is a 67 y.o. M with interstitial lung disease on 6-10L O2 by HFNC, COPD, chronic right heart failure and HTN who presents with several weeks progressive ankle swelling, worsening dyspnea on exertion, incraesed O2 needs, dry cough, and orthopnea.  Denied fevers, chills, or chest pain.  Called EMS the night of admission for worse dyspnea and was found to be saturating 60% on his usual 6 L/min of supplemental oxygen.  He was placed on nonrebreather and treated with 125 mg IV Solu-Medrol, 2 g IV magnesium, and DuoNeb prior to arrival in the ED.  In ER, CTA showed no consolidation or PE, BNP was elevated and he was started on Lasix and admitted to hospitalist service.     Assessment & Plan:  Acute right sided heart failure Echo shows normal LV, EF 60%, RVSF decreased, grade I DD, est PAP >70.  Creatinine stable, -1.2 L yesterday.  Potassium normal.  Appears near euvolemic.  Back to baseline O2 needs. -Continue 1 more dose IV furosemide today, then stop  -Strict I/Os, daily weights, telemetry  -Daily BMP    Interstitial lung disease CT shows stable ILD, no new consolidation or PE.  Patient states he is not a transplant candidate.  [facility] -Taper Solu-Medrol to 40 mg twice daily -Can you nebs scheduled and as needed -Continue cough syrup -Titrate HFNC as able to baseline (6L at rest, 10L with exertion) -Goal SpO2 88-92% only  Loculated pericardial effusion Clinically insignificant per Cardiology.  Hyperkalemia Resolved.  OSA  -Continue CPAP at night  HTN Blood pressure well controlled -Resume amlodipine -Losartan  Other medications -Continue duloxetine, trazodone, PPI, pain medicine  Normocytic anemia Anemia of chornic disease.  Stable, no signs fo bleeding.      DVT prophylaxis: Lovenox Code Status:  FULL Family Communication: None persent MDM and disposition Plan: The below labs and imaging reports reviewed and summarized above.  The patient was initially admitted with acute on chronic hypoxic respiratory failure from CHF in setting of progressive severe interstitial lung disease, end-stage ILD, and right-sided heart failure.  He has been diuresed, appears euvolemic.  His O2 saturations close to baseline.  Cardiology were consulted and recommend right and left heart catheterization on Monday.        Consultants:   Cardiology  Procedures:   Echocardiogram Study Conclusions  - Left ventricle: The cavity size was normal. Systolic function was   normal. The estimated ejection fraction was in the range of 60%   to 65%. Wall motion was normal; there were no regional wall   motion abnormalities. Doppler parameters are consistent with   abnormal left ventricular relaxation (grade 1 diastolic   dysfunction). - Ventricular septum: The contour showed systolic flattening. These   changes are consistent with RV pressure overload. - Aortic valve: Valve area (VTI): 2.63 cm^2. Valve area (Vmax):   2.58 cm^2. Valve area (Vmean): 2.4 cm^2. - Right ventricle: The cavity size was moderately to severely   dilated. Wall thickness was normal. Systolic function was   severely reduced. - Right atrium: The atrium was moderately to severely dilated. - Tricuspid valve: There was moderate regurgitation. - Pulmonary arteries: Systolic pressure was moderately to severely   increased. PA peak pressure: 76 mm Hg (S). - Pericardium, extracardiac: moderate to large loculated posterior   pericardial effusion. THe IVC does not  collapse, but the flow   across TV is phasic and suggest tamponade not present.   There was no chamber collapse. Respirophasic change in stroke   volume was normal.  Antimicrobials:   None   Subjective: Patient's breathing is improved, he is back to baseline O2.  He chest  pain, fever, cough, sputum.  He has not tried getting out of bed before, his fatigue is unchanged.  He has no confusion, dizziness.  Objective: Vitals:   08/20/17 0353 08/20/17 0744 08/20/17 0903 08/20/17 1229  BP: 113/71 134/76  125/82  Pulse: 61 65  95  Resp: 17 18  (!) 22  Temp:  97.6 F (36.4 C)  97.9 F (36.6 C)  TempSrc:  Oral  Oral  SpO2: 99% 98% 97% 93%  Weight: 76.7 kg (169 lb 1.5 oz)     Height:        Intake/Output Summary (Last 24 hours) at 08/20/2017 1331 Last data filed at 08/20/2017 1231 Gross per 24 hour  Intake -  Output 2225 ml  Net -2225 ml   Filed Weights   08/19/17 0500 08/19/17 0654 08/20/17 0353  Weight: 78.2 kg (172 lb 6.4 oz) 77.7 kg (171 lb 4.8 oz) 76.7 kg (169 lb 1.5 oz)    Examination: General appearance: Elderly adult male, sitting in bed, interactive, no acute distress. HEENT: Sclera anicteric, conjunctival pink, lids and lashes normal, no nasal deformity, discharge, or epistaxis.  Lips moist, dentures in place.  Oropharynx moist without lesions. Skin: It is warm and dry, he has no jaundice, he has no suspicious rashes of the face, neck, back, upper chest, arms, or legs. Cardiac: Regular, rhythm normal, normal S1 and S2, no murmurs appreciated.  JVP is normal.  No pitting lower extremity edema.  Radial and DP pulses are 2+ and symmetric.   Respiratory: Respirations seem normal.  He does pursed lip breathing after talking and his oxygen saturation drops when talking.  He has rales at the bases, unchanged from previous, no wheezing.    Abdomen: Soft without tenderness to palpation, distention, ascites, or splenomegaly.   MSK: No deformities or effusions. Neuro: He is awake and alert to person, place, and time.  EOMI, PERRLA.  Cranial nerves normal, moves all extremities equally and has normal coordination speech is fluent.    Psych: Joint is intact he is responding to questions, his attention, and affect are normal.  His judgment and insight appear  normal.    Data Reviewed: I have personally reviewed following labs and imaging studies:  CBC: Recent Labs  Lab 08/17/17 2308 08/19/17 0317 08/20/17 0244  WBC 13.1* 16.0* 17.0*  NEUTROABS 10.0*  --   --   HGB 11.3* 10.8* 11.4*  HCT 36.4* 35.3* 36.7*  MCV 89.4 89.1 89.7  PLT 203 180 199   Basic Metabolic Panel: Recent Labs  Lab 08/17/17 2308 08/19/17 0317 08/20/17 0244  NA 135 139 137  K 3.9 5.5* 4.7  CL 103 102 98*  CO2 GLUCOSE 155* 161* 154*  BUN 14 24* 29*  CREATININE 1.20 1.24 1.22  CALCIUM 8.5* 9.1 9.0   GFR: Estimated Creatinine Clearance: 57.6 mL/min (by C-G formula based on SCr of 1.22 mg/dL). Liver Function Tests: Recent Labs  Lab 08/17/17 2308  AST 21  ALT 14*  ALKPHOS 65  BILITOT 0.8  PROT 6.4*  ALBUMIN 3.2*   No results for input(s): LIPASE, AMYLASE in the last 168 hours. No results for input(s): AMMONIA in the last 168  hours. Coagulation Profile: No results for input(s): INR, PROTIME in the last 168 hours. Cardiac Enzymes: No results for input(s): CKTOTAL, CKMB, CKMBINDEX, TROPONINI in the last 168 hours. BNP (last 3 results) No results for input(s): PROBNP in the last 8760 hours. HbA1C: No results for input(s): HGBA1C in the last 72 hours. CBG: Recent Labs  Lab 08/19/17 0925  GLUCAP 249*   Lipid Profile: No results for input(s): CHOL, HDL, LDLCALC, TRIG, CHOLHDL, LDLDIRECT in the last 72 hours. Thyroid Function Tests: No results for input(s): TSH, T4TOTAL, FREET4, T3FREE, THYROIDAB in the last 72 hours. Anemia Panel: No results for input(s): VITAMINB12, FOLATE, FERRITIN, TIBC, IRON, RETICCTPCT in the last 72 hours. Urine analysis: No results found for: COLORURINE, APPEARANCEUR, LABSPEC, PHURINE, GLUCOSEU, HGBUR, BILIRUBINUR, KETONESUR, PROTEINUR, UROBILINOGEN, NITRITE, LEUKOCYTESUR Sepsis Labs: (procalcitonin:4,lacticacidven:4)  ) Recent Results (from the past 240 hour(s))  MRSA PCR Screening     Status: None    Collection Time: 08/18/17  4:50 PM  Result Value Ref Range Status   MRSA by PCR NEGATIVE NEGATIVE Final    Comment:        The GeneXpert MRSA Assay (FDA approved for NASAL specimens only), is one component of a comprehensive MRSA colonization surveillance program. It is not intended to diagnose MRSA infection nor to guide or monitor treatment for MRSA infections. Performed at [facility], [address] [place], [place] [address]          Radiology Studies: No results found.      Scheduled Meds: . DULoxetine  30 mg Oral Daily  . enoxaparin (LOVENOX) injection  40 mg Subcutaneous Q24H  . fluticasone  1 spray Each Nare Daily  . ipratropium-albuterol  3 mL Nebulization TID  . losartan  100 mg Oral Daily  . methylPREDNISolone (SOLU-MEDROL) injection  60 mg Intravenous Q12H  . multivitamin with minerals  1 tablet Oral Daily  . pantoprazole  40 mg Oral Daily  . sodium chloride flush  3 mL Intravenous Q12H  . sodium chloride flush  3 mL Intravenous Q12H  . traZODone  50 mg Oral QHS   Continuous Infusions: . sodium chloride       LOS: 2 days    Time spent: 25 minutes    Alberteen Sam, MD [facility] 08/20/2017, 12:05 PM     Pager [phone] --- please page though AMION:  [email or web address] If 7PM-7AM, please contact night-coverage

## 2017-08-21 LAB — CBC
HCT: 36.6 % — ABNORMAL LOW (ref 39.0–52.0)
HEMOGLOBIN: 11.4 g/dL — AB (ref 13.0–17.0)
MCH: 27.6 pg (ref 26.0–34.0)
MCHC: 31.1 g/dL (ref 30.0–36.0)
MCV: 88.6 fL (ref 78.0–100.0)
PLATELETS: 183 10*3/uL (ref 150–400)
RBC: 4.13 MIL/uL — AB (ref 4.22–5.81)
RDW: 14.1 % (ref 11.5–15.5)
WBC: 14.9 10*3/uL — ABNORMAL HIGH (ref 4.0–10.5)

## 2017-08-21 LAB — BASIC METABOLIC PANEL
ANION GAP: 8 (ref 5–15)
BUN: 26 mg/dL — ABNORMAL HIGH (ref 6–20)
CHLORIDE: 100 mmol/L — AB (ref 101–111)
CO2: 30 mmol/L (ref 22–32)
CREATININE: 1.01 mg/dL (ref 0.61–1.24)
Calcium: 9 mg/dL (ref 8.9–10.3)
GFR calc non Af Amer: >60 mL/min (ref >60)
Glucose, Bld: 146 mg/dL — ABNORMAL HIGH (ref 65–99)
Potassium: 4.5 mmol/L (ref 3.5–5.1)
SODIUM: 138 mmol/L (ref 135–145)

## 2017-08-21 LAB — GLUCOSE, CAPILLARY: Glucose-Capillary: 115 mg/dL — ABNORMAL HIGH (ref 65–99)

## 2017-08-21 MED ORDER — SODIUM CHLORIDE 0.9 % IV SOLN: 250.0000 mL | INTRAVENOUS | Status: DC | PRN

## 2017-08-21 MED ORDER — SODIUM CHLORIDE 0.9% FLUSH
3.0000 mL | Freq: Two times a day (BID) | INTRAVENOUS | Status: DC
  Administered 2017-08-21: 3 mL via INTRAVENOUS

## 2017-08-21 MED ORDER — SODIUM CHLORIDE 0.9 % IV SOLN
INTRAVENOUS | Status: DC
  Administered 2017-08-22: via INTRAVENOUS

## 2017-08-21 MED ORDER — ASPIRIN 81 MG PO CHEW
81.0000 mg | CHEWABLE_TABLET | ORAL | Status: AC
  Administered 2017-08-22: 81 mg via ORAL
  Filled 2017-08-21: qty 1

## 2017-08-21 MED ORDER — SODIUM CHLORIDE 0.9% FLUSH: 3.0000 mL | INTRAVENOUS | Status: DC | PRN

## 2017-08-21 MED ORDER — PANTOPRAZOLE SODIUM 40 MG PO TBEC
40.0000 mg | DELAYED_RELEASE_TABLET | Freq: Two times a day (BID) | ORAL | Status: DC
  Administered 2017-08-21 – 2017-08-23 (×4): 40 mg via ORAL
  Filled 2017-08-21 (×4): qty 1

## 2017-08-21 MED ORDER — METHYLPREDNISOLONE SODIUM SUCC 40 MG IJ SOLR
40.0000 mg | Freq: Every day | INTRAMUSCULAR | Status: DC
  Administered 2017-08-23: 10:00:00 40 mg via INTRAVENOUS
  Filled 2017-08-21 (×2): qty 1

## 2017-08-21 NOTE — Progress Notes (Signed)
Progress Note  Patient Name: Jose Flores Date of Encounter: 08/21/2017  Primary Cardiologist: [facility],   Arriel Victor / Bensimhon at [facility]  Subjective   67 year old gentleman with a history of chronic renal cysts rule lung disease, COPD.  He is admitted with pericardial effusion and pulmonary hypertension.  Continues to diurese nicely.  He is net 5.8 L out so for this admission. Scheduled for right heart cath tomorrow.  Inpatient Medications    Scheduled Meds: . DULoxetine  30 mg Oral Daily  . enoxaparin (LOVENOX) injection  40 mg Subcutaneous Q24H  . fluticasone  1 spray Each Nare Daily  . ipratropium-albuterol  3 mL Nebulization TID  . losartan  100 mg Oral Daily  . methylPREDNISolone (SOLU-MEDROL) injection  40 mg Intravenous Q12H  . multivitamin with minerals  1 tablet Oral Daily  . pantoprazole  40 mg Oral BID AC  . sodium chloride flush  3 mL Intravenous Q12H  . sodium chloride flush  3 mL Intravenous Q12H  . traZODone  50 mg Oral QHS   Continuous Infusions: . sodium chloride     PRN Meds: sodium chloride, acetaminophen **OR** acetaminophen, albuterol, bisacodyl, ondansetron **OR** ondansetron (ZOFRAN) IV, oxyCODONE, polyvinyl alcohol, senna-docusate, sodium chloride flush, traMADol   Vital Signs    Vitals:   08/20/17 2337 08/21/17 0435 08/21/17 0738 08/21/17 0824  BP:  110/70 135/77   Pulse:  63 62   Resp:  17 20   Temp: 98 F (36.7 C) 98.2 F (36.8 C) 97.6 F (36.4 C)   TempSrc: Oral Oral Oral   SpO2:  100% 97% 94%  Weight:  162 lb 0.6 oz (73.5 kg)    Height:        Intake/Output Summary (Last 24 hours) at 08/21/2017 1048 Last data filed at 08/21/2017 0436 Gross per 24 hour  Intake -  Output 2050 ml  Net -2050 ml   Filed Weights   08/19/17 0654 08/20/17 0353 08/21/17 0435  Weight: 171 lb 4.8 oz (77.7 kg) 169 lb 1.5 oz (76.7 kg) 162 lb 0.6 oz (73.5 kg)    Telemetry    Normal sinus rhythm- Personally Reviewed  ECG    NSR  - Personally  Reviewed  Physical Exam    Physical Exam: Blood pressure 135/77, pulse 62, temperature 97.6 F (36.4 C), temperature source Oral, resp. rate 20, height  (1.727 m), weight 162 lb 0.6 oz (73.5 kg), SpO2 94 %.  GEN:  Well nourished, well developed in no acute distress HEENT: Normal NECK: No JVD; No carotid bruits LYMPHATICS: No lymphadenopathy CARDIAC: RRR  , 2/6 systolic muirmur  RESPIRATORY:  Clear to auscultation without rales, wheezing or rhonchi  ABDOMEN: Soft, non-tender, non-distended MUSCULOSKELETAL:  No edema; No deformity  SKIN: Warm and dry NEUROLOGIC:  Alert and oriented x 3     Labs    Chemistry Recent Labs  Lab 08/17/17 2308 08/19/17 0317 08/20/17 0244 08/21/17 0303  NA 135 139 137 138  K 3.9 5.5* 4.7 4.5  CL 103 102 98* 100*  CO2 GLUCOSE 155* 161* 154* 146*  BUN 14 24* 29* 26*  CREATININE 1.20 1.24 1.22 1.01  CALCIUM 8.5* 9.1 9.0 9.0  PROT 6.4*  --   --   --   ALBUMIN 3.2*  --   --   --   AST 21  --   --   --   ALT 14*  --   --   --  ALKPHOS 65  --   --   --   BILITOT 0.8  --   --   --   GFRNONAA >60 59* >60 >60  GFRAA >60 >60 >60 >60  ANIONGAP Hematology Recent Labs  Lab 08/19/17 0317 08/20/17 0244 08/21/17 0303  WBC 16.0* 17.0* 14.9*  RBC 3.96* 4.09* 4.13*  HGB 10.8* 11.4* 11.4*  HCT 35.3* 36.7* 36.6*  MCV 89.1 89.7 88.6  MCH 27.3 27.9 27.6  MCHC 30.6 31.1 31.1  RDW 14.1 14.2 14.1  PLT 180 199 183    Cardiac EnzymesNo results for input(s): TROPONINI in the last 168 hours.  Recent Labs  Lab 08/17/17 2311  TROPIPOC 0.03     BNP Recent Labs  Lab 08/17/17 2304  BNP 940.1*     DDimer  Recent Labs  Lab 08/17/17 2308  DDIMER 1.40*     Radiology    No results found.  Cardiac Studies    Patient Profile     67 y.o. male with interstitial lung disease and pulmonary hypertension.  Assessment & Plan    1.  Pulmonary hypertension: The patient is feeling better after diuresis. He is net  -5.8 L so far . I talked to Dr. Gala Romney.    For R heart cath tomorrow . He's been on IV lasix for the past several days.   Will assess his need for PO diuretics after R heart cath tomorrow  2.  Pericardial effusion:  No evidence of tamponade .   For questions or updates, please contact [facility] Please consult [email or web address] for contact info under Cardiology/STEMI.      Signed, Kristeen Miss, MD  08/21/2017, 10:48 AM

## 2017-08-21 NOTE — Progress Notes (Signed)
RN assumed care of pt at 2300. Pt lying in bed asleep, resting comfortably. Pt had no concerns/questions at this time. Pt in NAD. Will continue to monitor.  Caswell Corwin, RN 08/21/17 12:30 AM

## 2017-08-21 NOTE — Plan of Care (Signed)
Pt with good knowledge of lung processes, oxygen requirements, monitoring of oxygen saturations, and knowledge of increased requirements with activity.    Appetite good.    Pt with desaturations to 69% on 6L HF [place] with transferring only to [facility] from bed.  Oxygen increased to 10L HF [place] with good response.  Pt recovered in < 3 minutes.  During transfer back to bed from [facility], on 10L HF [place], sats again dropped to 74%.  Pt recovered quickly.  Sats back up to 97% and began weaning oxygen flow rate back down to 6L.  Pt reports this has become his recent baseline.  O2 sats presently 97% with no distress, good color, normal WOB.

## 2017-08-21 NOTE — H&P (View-Only) (Signed)
 Progress Note  Patient Name: Jose Flores Date of Encounter: 08/21/2017  Primary Cardiologist: [facility],   Nahser / Bensimhon at [facility]  Subjective   67-year-old gentleman with a history of chronic renal cysts rule lung disease, COPD.  He is admitted with pericardial effusion and pulmonary hypertension.  Continues to diurese nicely.  He is net 5.8 L out so for this admission. Scheduled for right heart cath tomorrow.  Inpatient Medications    Scheduled Meds: . DULoxetine  30 mg Oral Daily  . enoxaparin (LOVENOX) injection  40 mg Subcutaneous Q24H  . fluticasone  1 spray Each Nare Daily  . ipratropium-albuterol  3 mL Nebulization TID  . losartan  100 mg Oral Daily  . methylPREDNISolone (SOLU-MEDROL) injection  40 mg Intravenous Q12H  . multivitamin with minerals  1 tablet Oral Daily  . pantoprazole  40 mg Oral BID AC  . sodium chloride flush  3 mL Intravenous Q12H  . sodium chloride flush  3 mL Intravenous Q12H  . traZODone  50 mg Oral QHS   Continuous Infusions: . sodium chloride     PRN Meds: sodium chloride, acetaminophen **OR** acetaminophen, albuterol, bisacodyl, ondansetron **OR** ondansetron (ZOFRAN) IV, oxyCODONE, polyvinyl alcohol, senna-docusate, sodium chloride flush, traMADol   Vital Signs    Vitals:   08/20/17 2337 08/21/17 0435 08/21/17 0738 08/21/17 0824  BP:  110/70 135/77   Pulse:  63 62   Resp:  17 20   Temp: 98 F (36.7 C) 98.2 F (36.8 C) 97.6 F (36.4 C)   TempSrc: Oral Oral Oral   SpO2:  100% 97% 94%  Weight:  162 lb 0.6 oz (73.5 kg)    Height:        Intake/Output Summary (Last 24 hours) at 08/21/2017 1048 Last data filed at 08/21/2017 0436 Gross per 24 hour  Intake -  Output 2050 ml  Net -2050 ml   Filed Weights   08/19/17 0654 08/20/17 0353 08/21/17 0435  Weight: 171 lb 4.8 oz (77.7 kg) 169 lb 1.5 oz (76.7 kg) 162 lb 0.6 oz (73.5 kg)    Telemetry    Normal sinus rhythm- Personally Reviewed  ECG    NSR  - Personally  Reviewed  Physical Exam    Physical Exam: Blood pressure 135/77, pulse 62, temperature 97.6 F (36.4 C), temperature source Oral, resp. rate 20, height 5' 8" (1.727 m), weight 162 lb 0.6 oz (73.5 kg), SpO2 94 %.  GEN:  Well nourished, well developed in no acute distress HEENT: Normal NECK: No JVD; No carotid bruits LYMPHATICS: No lymphadenopathy CARDIAC: RRR  , 2/6 systolic muirmur  RESPIRATORY:  Clear to auscultation without rales, wheezing or rhonchi  ABDOMEN: Soft, non-tender, non-distended MUSCULOSKELETAL:  No edema; No deformity  SKIN: Warm and dry NEUROLOGIC:  Alert and oriented x 3     Labs    Chemistry Recent Labs  Lab 08/17/17 2308 08/19/17 0317 08/20/17 0244 08/21/17 0303  NA 135 139 137 138  K 3.9 5.5* 4.7 4.5  CL 103 102 98* 100*  CO2 23 28 31 30  GLUCOSE 155* 161* 154* 146*  BUN 14 24* 29* 26*  CREATININE 1.20 1.24 1.22 1.01  CALCIUM 8.5* 9.1 9.0 9.0  PROT 6.4*  --   --   --   ALBUMIN 3.2*  --   --   --   AST 21  --   --   --   ALT 14*  --   --   --     ALKPHOS 65  --   --   --   BILITOT 0.8  --   --   --   GFRNONAA >60 59* >60 >60  GFRAA >60 >60 >60 >60  ANIONGAP Hematology Recent Labs  Lab 08/19/17 0317 08/20/17 0244 08/21/17 0303  WBC 16.0* 17.0* 14.9*  RBC 3.96* 4.09* 4.13*  HGB 10.8* 11.4* 11.4*  HCT 35.3* 36.7* 36.6*  MCV 89.1 89.7 88.6  MCH 27.3 27.9 27.6  MCHC 30.6 31.1 31.1  RDW 14.1 14.2 14.1  PLT 180 199 183    Cardiac EnzymesNo results for input(s): TROPONINI in the last 168 hours.  Recent Labs  Lab 08/17/17 2311  TROPIPOC 0.03     BNP Recent Labs  Lab 08/17/17 2304  BNP 940.1*     DDimer  Recent Labs  Lab 08/17/17 2308  DDIMER 1.40*     Radiology    No results found.  Cardiac Studies    Patient Profile     67 y.o. male with interstitial lung disease and pulmonary hypertension.  Assessment & Plan    1.  Pulmonary hypertension: The patient is feeling better after diuresis. He is net  -5.8 L so far . I talked to Dr. Gala Romney.    For R heart cath tomorrow . He's been on IV lasix for the past several days.   Will assess his need for PO diuretics after R heart cath tomorrow  2.  Pericardial effusion:  No evidence of tamponade .   For questions or updates, please contact [facility] Please consult [email or web address] for contact info under Cardiology/STEMI.      Signed, Kristeen Miss, MD  08/21/2017, 10:48 AM

## 2017-08-21 NOTE — Progress Notes (Signed)
PROGRESS NOTE    Jose Flores  RUE:[id number] DOB: 12/28/1950 DOA: 08/17/2017 PCP: [facility]      Brief Narrative:  [name] is a 67 y.o. M with interstitial lung disease on 6-10L O2 by HFNC, COPD, chronic right heart failure and HTN who presents with several weeks progressive ankle swelling, worsening dyspnea on exertion, incraesed O2 needs, dry cough, and orthopnea.  Denied fevers, chills, or chest pain.  Called EMS the night of admission for worse dyspnea and was found to be saturating 60% on his usual 6 L/min of supplemental oxygen.  He was placed on nonrebreather and treated with 125 mg IV Solu-Medrol, 2 g IV magnesium, and DuoNeb prior to arrival in the ED.  In ER, CTA showed no consolidation or PE, BNP was elevated and he was started on Lasix and admitted to hospitalist service.     Assessment & Plan:  Acute right sided heart failure Echo shows normal LV, EF 60%, RVSF decreased, grade I DD, est PAP >70.  Creatinine stable, -2 L yesterday.  Na good, Cr stable, K normal.   Back to baseline O2 needs. -Stop Lasix -RHC tomorrow, appreciate cardiology evaluation **If RHC shows no reversible causes of R heart failure, I do suspect that the patient is on a relatively short downward trajectory, given his reported pretty severe exertional intolerance (desats to low 80s with even transfers or walking short distances), furthermore, without improvement of his RHF, he is not a lung transplant candidate -Under those circumstances, patient would benefit from prompt Palliative Care referral -Strict I/Os, daily weights, telemetry  -Daily BMP    Interstitial lung disease CT shows stable ILD, no new consolidation or PE.  Patient states he is not a transplant candidate.  [facility] -Taper Solu-medrol to 1x daily -Continue PPI -Continue PRN nebs -Continue cough syrup -Titrate HFNC as able to baseline (6L at rest, 10L with exertion) -Goal SpO2 88-92%  only  Loculated pericardial effusion Clinically insignificant per Cardiology.  Hyperkalemia Resolved.  OSA  -Continue CPAP  HTN Blood pressure well controlled -Continue amlodipine, losartan   Other medications -Continue duloxetine, trazodone, PPI, pain medicine  Normocytic anemia Anemia of chornic disease.  Stable, no signs fo bleeding.      DVT prophylaxis: Lovenox Code Status: FULL Family Communication: None persent MDM and disposition Plan:   The below labs and imaging were reviewed and summarize dabove.  [place] with ILD, presented with dyspnea on exertion, leg swelling, found to have RHF.  Now 6kg diuresis, is back on home O2.    Plan for RHC tomorrow early.  If no reversible causes of RHF, will plan for discharge tomorrow, will need follow up with Palliative care as an outpaitent.        Consultants:   Cardiology  Procedures:   Echocardiogram Study Conclusions  - Left ventricle: The cavity size was normal. Systolic function was   normal. The estimated ejection fraction was in the range of 60%   to 65%. Wall motion was normal; there were no regional wall   motion abnormalities. Doppler parameters are consistent with   abnormal left ventricular relaxation (grade 1 diastolic   dysfunction). - Ventricular septum: The contour showed systolic flattening. These   changes are consistent with RV pressure overload. - Aortic valve: Valve area (VTI): 2.63 cm^2. Valve area (Vmax):   2.58 cm^2. Valve area (Vmean): 2.4 cm^2. - Right ventricle: The cavity size was moderately to severely   dilated. Wall thickness was normal. Systolic  function was   severely reduced. - Right atrium: The atrium was moderately to severely dilated. - Tricuspid valve: There was moderate regurgitation. - Pulmonary arteries: Systolic pressure was moderately to severely   increased. PA peak pressure: 76 mm Hg (S). - Pericardium, extracardiac: moderate to large loculated posterior    pericardial effusion. THe IVC does not collapse, but the flow   across TV is phasic and suggest tamponade not present.   There was no chamber collapse. Respirophasic change in stroke   volume was normal.  Antimicrobials:   None   Subjective: No dyspnea or orthopnea overnight.  No new chest pain, fever, cough or sputum.     Objective: Vitals:   08/20/17 2337 08/21/17 0435 08/21/17 0738 08/21/17 0824  BP:  110/70 135/77   Pulse:  63 62   Resp:  17 20   Temp: 98 F (36.7 C) 98.2 F (36.8 C) 97.6 F (36.4 C)   TempSrc: Oral Oral Oral   SpO2:  100% 97% 94%  Weight:  73.5 kg (162 lb 0.6 oz)    Height:        Intake/Output Summary (Last 24 hours) at 08/21/2017 1058 Last data filed at 08/21/2017 0436 Gross per 24 hour  Intake -  Output 2050 ml  Net -2050 ml   Filed Weights   08/19/17 0654 08/20/17 0353 08/21/17 0435  Weight: 77.7 kg (171 lb 4.8 oz) 76.7 kg (169 lb 1.5 oz) 73.5 kg (162 lb 0.6 oz)    Examination: General appearance: Elderly adult male, interactive, no acute distress. HEENT: Sclera anicteric, conjunctive are pink, lids and lashes normal, no nasal deformity, discharge, or epistaxis.  Lips moist, dentures in place.  Oropharynx moist without lesions. Skin: Skin is warm and dry without jaundice.  He has no red suspicious rashes on the face, neck, back, upper chest, arms or legs. Cardiac: Heart rate is regular in rhythm and rate, normal S1 and S2, no murmurs.  JVP still appears up to me, no lower extremity edema.Marland Kitchen    Respiratory: Respirations normal at rest, sometimes pursed lip breathing.  No wheezing or rales.    Abdomen: Soft without tenderness to palpation, distention, ascites, or splenomegaly.   MSK: No deformities or effusions. Neuro: Alert to person, place, and time.  Extraocular movements intact, PERRLA.  Cranial nerves normal, sensation intact, speech fluent.    Psych: Sensorium intact and responding to questions.  Attention and affect are normal.  Judgment  and insight are normal.    Data Reviewed: I have personally reviewed following labs and imaging studies:  CBC: Recent Labs  Lab 08/17/17 2308 08/19/17 0317 08/20/17 0244 08/21/17 0303  WBC 13.1* 16.0* 17.0* 14.9*  NEUTROABS 10.0*  --   --   --   HGB 11.3* 10.8* 11.4* 11.4*  HCT 36.4* 35.3* 36.7* 36.6*  MCV 89.4 89.1 89.7 88.6  PLT 203 180 199 183   Basic Metabolic Panel: Recent Labs  Lab 08/17/17 2308 08/19/17 0317 08/20/17 0244 08/21/17 0303  NA 135 139 137 138  K 3.9 5.5* 4.7 4.5  CL 103 102 98* 100*  CO2 GLUCOSE 155* 161* 154* 146*  BUN 14 24* 29* 26*  CREATININE 1.20 1.24 1.22 1.01  CALCIUM 8.5* 9.1 9.0 9.0   GFR: Estimated Creatinine Clearance: 69.6 mL/min (by C-G formula based on SCr of 1.01 mg/dL). Liver Function Tests: Recent Labs  Lab 08/17/17 2308  AST 21  ALT 14*  ALKPHOS 65  BILITOT 0.8  PROT  6.4*  ALBUMIN 3.2*   No results for input(s): LIPASE, AMYLASE in the last 168 hours. No results for input(s): AMMONIA in the last 168 hours. Coagulation Profile: Recent Labs  Lab 08/20/17 1343  INR 1.14   Cardiac Enzymes: No results for input(s): CKTOTAL, CKMB, CKMBINDEX, TROPONINI in the last 168 hours. BNP (last 3 results) No results for input(s): PROBNP in the last 8760 hours. HbA1C: No results for input(s): HGBA1C in the last 72 hours. CBG: Recent Labs  Lab 08/19/17 0925 08/21/17 0741  GLUCAP 249* 115*   Lipid Profile: No results for input(s): CHOL, HDL, LDLCALC, TRIG, CHOLHDL, LDLDIRECT in the last 72 hours. Thyroid Function Tests: No results for input(s): TSH, T4TOTAL, FREET4, T3FREE, THYROIDAB in the last 72 hours. Anemia Panel: No results for input(s): VITAMINB12, FOLATE, FERRITIN, TIBC, IRON, RETICCTPCT in the last 72 hours. Urine analysis: No results found for: COLORURINE, APPEARANCEUR, LABSPEC, PHURINE, GLUCOSEU, HGBUR, BILIRUBINUR, KETONESUR, PROTEINUR, UROBILINOGEN, NITRITE, LEUKOCYTESUR Sepsis  Labs: (procalcitonin:4,lacticacidven:4)  ) Recent Results (from the past 240 hour(s))  MRSA PCR Screening     Status: None   Collection Time: 08/18/17  4:50 PM  Result Value Ref Range Status   MRSA by PCR NEGATIVE NEGATIVE Final    Comment:        The GeneXpert MRSA Assay (FDA approved for NASAL specimens only), is one component of a comprehensive MRSA colonization surveillance program. It is not intended to diagnose MRSA infection nor to guide or monitor treatment for MRSA infections. Performed at [facility], [address], [place], [place] [address]          Radiology Studies: No results found.      Scheduled Meds: . DULoxetine  30 mg Oral Daily  . enoxaparin (LOVENOX) injection  40 mg Subcutaneous Q24H  . fluticasone  1 spray Each Nare Daily  . ipratropium-albuterol  3 mL Nebulization TID  . losartan  100 mg Oral Daily  . methylPREDNISolone (SOLU-MEDROL) injection  40 mg Intravenous Q12H  . multivitamin with minerals  1 tablet Oral Daily  . pantoprazole  40 mg Oral BID AC  . sodium chloride flush  3 mL Intravenous Q12H  . sodium chloride flush  3 mL Intravenous Q12H  . traZODone  50 mg Oral QHS   Continuous Infusions: . sodium chloride       LOS: 3 days    Time spent: 25 minutes    Alberteen Sam, MD [facility] 08/21/2017, 10:58 AM     Pager [phone] --- please page though AMION:  [email or web address] Password [email or web address] If 7PM-7AM, please contact night-coverage

## 2017-08-21 NOTE — Progress Notes (Signed)
Pt. Refused cpap. 

## 2017-08-22 ENCOUNTER — Inpatient Hospital Stay (HOSPITAL_COMMUNITY)
Admission: EM | Disposition: A | Discharge status: Home / Self Care | Payer: Self-pay | Source: Home / Self Care | Attending: Family Medicine

## 2017-08-22 ENCOUNTER — Encounter (HOSPITAL_COMMUNITY): Payer: Self-pay | Admitting: Internal Medicine

## 2017-08-22 DIAGNOSIS — I2510 Atherosclerotic heart disease of native coronary artery without angina pectoris: Secondary | ICD-10-CM

## 2017-08-22 DIAGNOSIS — I2721 Secondary pulmonary arterial hypertension: Secondary | ICD-10-CM

## 2017-08-22 HISTORY — PX: RIGHT/LEFT HEART CATH AND CORONARY ANGIOGRAPHY: CATH118266

## 2017-08-22 HISTORY — PX: CORONARY STENT INTERVENTION: CATH118234

## 2017-08-22 LAB — POCT I-STAT 3, ART BLOOD GAS (G3+)
Acid-Base Excess: 2 mmol/L (ref 0.0–2.0)
Bicarbonate: 26.2 mmol/L (ref 20.0–28.0)
O2 Saturation: 92 %
PCO2 ART: 38 mmHg (ref 32.0–48.0)
PH ART: 7.446 (ref 7.350–7.450)
PO2 ART: 60 mmHg — AB (ref 83.0–108.0)
TCO2: 27 mmol/L (ref 22–32)

## 2017-08-22 LAB — POCT I-STAT 3, VENOUS BLOOD GAS (G3P V)
ACID-BASE EXCESS: 3 mmol/L — AB (ref 0.0–2.0)
Acid-Base Excess: 6 mmol/L — ABNORMAL HIGH (ref 0.0–2.0)
BICARBONATE: 27.9 mmol/L (ref 20.0–28.0)
Bicarbonate: 31.4 mmol/L — ABNORMAL HIGH (ref 20.0–28.0)
O2 SAT: 70 %
O2 Saturation: 69 %
PCO2 VEN: 43.5 mmHg — AB (ref 44.0–60.0)
PCO2 VEN: 47.1 mmHg (ref 44.0–60.0)
PO2 VEN: 35 mmHg (ref 32.0–45.0)
TCO2: 29 mmol/L (ref 22–32)
TCO2: 33 mmol/L — AB (ref 22–32)
pH, Ven: 7.415 (ref 7.250–7.430)
pH, Ven: 7.432 — ABNORMAL HIGH (ref 7.250–7.430)
pO2, Ven: 36 mmHg (ref 32.0–45.0)

## 2017-08-22 LAB — CBC
HCT: 39.4 % (ref 39.0–52.0)
HEMOGLOBIN: 12.3 g/dL — AB (ref 13.0–17.0)
MCH: 28.1 pg (ref 26.0–34.0)
MCHC: 31.2 g/dL (ref 30.0–36.0)
MCV: 90 fL (ref 78.0–100.0)
Platelets: 183 10*3/uL (ref 150–400)
RBC: 4.38 MIL/uL (ref 4.22–5.81)
RDW: 14.5 % (ref 11.5–15.5)
WBC: 14.7 10*3/uL — ABNORMAL HIGH (ref 4.0–10.5)

## 2017-08-22 LAB — BASIC METABOLIC PANEL
ANION GAP: 6 (ref 5–15)
BUN: 17 mg/dL (ref 6–20)
CALCIUM: 8.4 mg/dL — AB (ref 8.9–10.3)
CO2: 30 mmol/L (ref 22–32)
Chloride: 104 mmol/L (ref 101–111)
Creatinine, Ser: 1.07 mg/dL (ref 0.61–1.24)
Glucose, Bld: 101 mg/dL — ABNORMAL HIGH (ref 65–99)
Potassium: 4.1 mmol/L (ref 3.5–5.1)
Sodium: 140 mmol/L (ref 135–145)

## 2017-08-22 LAB — GLUCOSE, CAPILLARY: GLUCOSE-CAPILLARY: 81 mg/dL (ref 65–99)

## 2017-08-22 LAB — POCT ACTIVATED CLOTTING TIME: Activated Clotting Time: 378 seconds

## 2017-08-22 SURGERY — RIGHT/LEFT HEART CATH AND CORONARY ANGIOGRAPHY
Anesthesia: LOCAL

## 2017-08-22 MED ORDER — ASPIRIN 81 MG PO CHEW
CHEWABLE_TABLET | ORAL | Status: DC | PRN
  Administered 2017-08-22: 243 mg via ORAL

## 2017-08-22 MED ORDER — HEPARIN (PORCINE) IN NACL 1000-0.9 UT/500ML-% IV SOLN
INTRAVENOUS | Status: AC
  Filled 2017-08-22: qty 500

## 2017-08-22 MED ORDER — ASPIRIN 81 MG PO CHEW: 81.0000 mg | CHEWABLE_TABLET | Freq: Every day | ORAL | Status: DC

## 2017-08-22 MED ORDER — LIDOCAINE HCL (PF) 1 % IJ SOLN
INTRAMUSCULAR | Status: AC
  Filled 2017-08-22: qty 30

## 2017-08-22 MED ORDER — CLOPIDOGREL BISULFATE 75 MG PO TABS
75.0000 mg | ORAL_TABLET | Freq: Every day | ORAL | Status: DC
  Administered 2017-08-23: 09:00:00 75 mg via ORAL
  Filled 2017-08-22: qty 1

## 2017-08-22 MED ORDER — VERAPAMIL HCL 2.5 MG/ML IV SOLN
INTRAVENOUS | Status: AC
  Filled 2017-08-22: qty 2

## 2017-08-22 MED ORDER — HEPARIN (PORCINE) IN NACL 2-0.9 UNITS/ML
INTRAMUSCULAR | Status: AC | PRN
  Administered 2017-08-22 (×3): 500 mL

## 2017-08-22 MED ORDER — LIDOCAINE HCL (PF) 1 % IJ SOLN
INTRAMUSCULAR | Status: DC | PRN
  Administered 2017-08-22 (×2): 2 mL

## 2017-08-22 MED ORDER — CLOPIDOGREL BISULFATE 300 MG PO TABS
ORAL_TABLET | ORAL | Status: AC
  Filled 2017-08-22: qty 2

## 2017-08-22 MED ORDER — SODIUM CHLORIDE 0.9 % IV SOLN
1.7500 mg/kg/h | INTRAVENOUS | Status: AC
  Administered 2017-08-22: 1.75 mg/kg/h via INTRAVENOUS
  Filled 2017-08-22 (×2): qty 250

## 2017-08-22 MED ORDER — IOHEXOL 350 MG/ML SOLN
INTRAVENOUS | Status: DC | PRN
  Administered 2017-08-22: 50 mL via INTRA_ARTERIAL

## 2017-08-22 MED ORDER — CLOPIDOGREL BISULFATE 300 MG PO TABS
ORAL_TABLET | ORAL | Status: DC | PRN
  Administered 2017-08-22: 600 mg via ORAL

## 2017-08-22 MED ORDER — FAMOTIDINE IN NACL 20-0.9 MG/50ML-% IV SOLN
INTRAVENOUS | Status: AC
  Filled 2017-08-22: qty 50

## 2017-08-22 MED ORDER — SODIUM CHLORIDE 0.9% FLUSH: 3.0000 mL | Freq: Two times a day (BID) | INTRAVENOUS | Status: DC

## 2017-08-22 MED ORDER — FAMOTIDINE IN NACL 20-0.9 MG/50ML-% IV SOLN
INTRAVENOUS | Status: AC | PRN
  Administered 2017-08-22: 20 mg via INTRAVENOUS

## 2017-08-22 MED ORDER — ANGIOPLASTY BOOK
Freq: Once | Status: AC
  Administered 2017-08-22: 22:00:00
  Filled 2017-08-22: qty 1

## 2017-08-22 MED ORDER — BIVALIRUDIN TRIFLUOROACETATE 250 MG IV SOLR
INTRAVENOUS | Status: AC
  Filled 2017-08-22: qty 250

## 2017-08-22 MED ORDER — ACETAMINOPHEN 325 MG PO TABS: 650.0000 mg | ORAL_TABLET | ORAL | Status: DC | PRN

## 2017-08-22 MED ORDER — SODIUM CHLORIDE 0.9% FLUSH: 3.0000 mL | INTRAVENOUS | Status: DC | PRN

## 2017-08-22 MED ORDER — ASPIRIN 81 MG PO CHEW
CHEWABLE_TABLET | ORAL | Status: AC
  Filled 2017-08-22: qty 3

## 2017-08-22 MED ORDER — ASPIRIN 81 MG PO CHEW
81.0000 mg | CHEWABLE_TABLET | Freq: Every day | ORAL | Status: DC
  Administered 2017-08-23: 10:00:00 81 mg via ORAL
  Filled 2017-08-22: qty 1

## 2017-08-22 MED ORDER — IOHEXOL 350 MG/ML SOLN
INTRAVENOUS | Status: DC | PRN
  Administered 2017-08-22: 55 mL via INTRA_ARTERIAL

## 2017-08-22 MED ORDER — SODIUM CHLORIDE 0.9 % IV SOLN
INTRAVENOUS | Status: AC | PRN
  Administered 2017-08-22: 10 mL/h via INTRAVENOUS

## 2017-08-22 MED ORDER — SODIUM CHLORIDE 0.9 % IV SOLN: 250.0000 mL | INTRAVENOUS | Status: DC | PRN

## 2017-08-22 MED ORDER — LABETALOL HCL 5 MG/ML IV SOLN: 10.0000 mg | INTRAVENOUS | Status: AC | PRN

## 2017-08-22 MED ORDER — HYDRALAZINE HCL 20 MG/ML IJ SOLN: 5.0000 mg | INTRAMUSCULAR | Status: AC | PRN

## 2017-08-22 MED ORDER — BIVALIRUDIN BOLUS VIA INFUSION - CUPID
INTRAVENOUS | Status: DC | PRN
  Administered 2017-08-22: 56.1 mg via INTRAVENOUS

## 2017-08-22 MED ORDER — ONDANSETRON HCL 4 MG/2ML IJ SOLN
INTRAMUSCULAR | Status: AC
  Filled 2017-08-22: qty 2

## 2017-08-22 MED ORDER — SODIUM CHLORIDE 0.9 % IV SOLN
INTRAVENOUS | Status: AC
  Administered 2017-08-22: 12:00:00 via INTRAVENOUS

## 2017-08-22 MED ORDER — MORPHINE SULFATE (PF) 2 MG/ML IV SOLN: 2.0000 mg | INTRAVENOUS | Status: DC | PRN

## 2017-08-22 MED ORDER — ONDANSETRON HCL 4 MG/2ML IJ SOLN
INTRAMUSCULAR | Status: DC | PRN
  Administered 2017-08-22: 4 mg via INTRAVENOUS

## 2017-08-22 MED ORDER — HEPARIN SODIUM (PORCINE) 1000 UNIT/ML IJ SOLN
INTRAMUSCULAR | Status: DC | PRN
  Administered 2017-08-22: 3500 [IU] via INTRAVENOUS

## 2017-08-22 MED ORDER — ONDANSETRON HCL 4 MG/2ML IJ SOLN: 4.0000 mg | Freq: Four times a day (QID) | INTRAMUSCULAR | Status: DC | PRN

## 2017-08-22 MED ORDER — SODIUM CHLORIDE 0.9 % IV SOLN
INTRAVENOUS | Status: AC | PRN
  Administered 2017-08-22 (×2): 1.75 mg/kg/h via INTRAVENOUS

## 2017-08-22 MED ORDER — HEPARIN (PORCINE) IN NACL 2-0.9 UNIT/ML-% IJ SOLN
INTRAMUSCULAR | Status: DC | PRN
  Administered 2017-08-22: 10 mL via INTRA_ARTERIAL

## 2017-08-22 SURGICAL SUPPLY — 25 items
BALLN SAPPHIRE 2.0X12 (BALLOONS) ×2
BALLOON SAPPHIRE 2.0X12 (BALLOONS) IMPLANT
CATH BALLN WEDGE 5F 110CM (CATHETERS) ×1 IMPLANT
CATH INFINITI 5 FR JL3.5 (CATHETERS) ×1 IMPLANT
CATH INFINITI JR4 5F (CATHETERS) ×1 IMPLANT
CATH VISTA GUIDE 6FR XB3.5 (CATHETERS) ×1 IMPLANT
DEVICE RAD COMP TR BAND LRG (VASCULAR PRODUCTS) ×2 IMPLANT
GLIDESHEATH SLEND SS 6F .021 (SHEATH) IMPLANT
GLIDESHEATH SLENDER 7FR .021G (SHEATH) ×1 IMPLANT
GUIDEWIRE INQWIRE 1.5J.035X260 (WIRE) IMPLANT
INQWIRE 1.5J .035X260CM (WIRE) ×2
KIT ENCORE 26 ADVANTAGE (KITS) ×1 IMPLANT
KIT HEART LEFT (KITS) ×2 IMPLANT
NDL PERC 21GX4CM (NEEDLE) IMPLANT
NEEDLE PERC 21GX4CM (NEEDLE) ×2 IMPLANT
PACK CARDIAC CATHETERIZATION (CUSTOM PROCEDURE TRAY) ×2 IMPLANT
SHEATH RAIN 4/5FR (SHEATH) ×1 IMPLANT
SHEATH RAIN RADIAL 21G 6FR (SHEATH) ×1 IMPLANT
STENT RESOLUTE ONYX 2.5X12 (Permanent Stent) ×1 IMPLANT
TRANSDUCER W/STOPCOCK (MISCELLANEOUS) ×2 IMPLANT
TUBING ART PRESS 72  MALE/FEM (TUBING) ×1
TUBING ART PRESS 72 MALE/FEM (TUBING) IMPLANT
TUBING CIL FLEX 10 FLL-RA (TUBING) ×2 IMPLANT
WIRE ASAHI PROWATER 180CM (WIRE) ×1 IMPLANT
WIRE HI TORQ VERSACORE-J 145CM (WIRE) ×1 IMPLANT

## 2017-08-22 NOTE — Progress Notes (Signed)
RT placed pt on 10L HFNC due to sats 82-85%. Sats 90-92% on 10L HFNC. Per MD sat goal 88-92

## 2017-08-22 NOTE — Care Management Note (Signed)
Case Management Note  Patient Details  Name: Jose Flores MRN: [id number] Date of Birth: 1951-03-03  Subjective/Objective:  From home with wife, ES ILD, R HF, Pericardial effusion, pulm HTN.  S/p coronary stent intervention, will be on plavix and asa.  He goes to [facility] in [place] to get his medications ,he PCP is Dr. Antonietta Jewel fax phone is [phone].  NCM will fax dc summary to his PCP.  He also has home oxygen thru [employer] and his wife will bring his oxygen tank for him to go home with  At discharge.                  Action/Plan: DC home when ready.   Expected Discharge Date:                  Expected Discharge Plan:  Home/Self Care  In-House Referral:     Discharge planning Services  CM Consult  Post Acute Care Choice:    Choice offered to:     DME Arranged:    DME Agency:     HH Arranged:    HH Agency:     Status of Service:  Completed, signed off  If discussed at [employer] of Stay Meetings, dates discussed:    Additional Comments:  Leone Haven, RN 08/22/2017, 3:02 PM

## 2017-08-22 NOTE — Progress Notes (Signed)
PROGRESS NOTE    Jose Flores  ZOX:[id number] DOB: 04/19/1950 DOA: 08/17/2017 [facility]: [facility]      Brief Narrative:  Jose Flores is a 67 y.o. M with interstitial lung disease on 6-10L O2 by HFNC, COPD, chronic right heart failure and HTN who presents with several weeks progressive ankle swelling, worsening dyspnea on exertion, incraesed O2 needs, dry cough, and orthopnea.  Denied fevers, chills, or chest pain.  Called EMS the night of admission for worse dyspnea and was found to be saturating 60% on his usual 6 L/min of supplemental oxygen.  He was placed on nonrebreather and treated with 125 mg IV Solu-Medrol, 2 g IV magnesium, and DuoNeb prior to arrival in the ED.  In ER, CTA showed no consolidation or PE, BNP was elevated and he was started on Lasix and admitted to hospitalist service.     Assessment & Plan:  Acute right sided heart failure Echo shows normal LV, EF 60%, RVSF decreased, grade I DD, est PAP >70.  -1.5 L yesterday even off Lasix.  Na good, Cr stable, K normal.   Back to baseline O2 needs. -Strict I/Os, daily weights, telemetry  -Daily BMP   Coronary artery disease DES Stent to LCx today -Continue Plavix, aspirin, statin  Interstitial lung disease CT shows stable ILD, no new consolidation or PE.    [facility] -Continue Solu-medrol, transition to oral prednisone tomororw, taper to 30 -Continue PPI -Continue PRN nebs and cough syrup -Review transplant for lungs with primary Pulmonology -Titrate HFNC as able to baseline (6L at rest, 10L with exertion) -Goal SpO2 88-92% only  Loculated pericardial effusion Clinically insignificant per Cardiology.  Hyperkalemia Resolved.  OSA  -Continue CPAP  HTN Blood pressure well controlled -Continue amlodipine, losartan  Other medications -Continue duloxetine, trazodone, PPI, pain medicine  Normocytic anemia Anemia of chornic disease.  Stable, no signs fo bleeding.      DVT  prophylaxis: Lovenox Code Status: FULL Family Communication: None persent MDM and disposition Plan:  Below labs and imaging reports were reviewed and summarized above.  Patient with severe end-stage interstitial lung disease, presented with dyspnea on exertion, leg swelling found to have right heart failure.  He has diuresed nicely and is back on his home oxygen level.  Cardiology were consulted for a loculated effusion, recommend left and right heart cath, which showed severe one-vessel disease which was stented today.  Likely home tomorrow.  Will need lung transplant evaluation.      Consultants:   Cardiology  Procedures:   Echocardiogram Study Conclusions  - Left ventricle: The cavity size was normal. Systolic function was   normal. The estimated ejection fraction was in the range of 60%   to 65%. Wall motion was normal; there were no regional wall   motion abnormalities. Doppler parameters are consistent with   abnormal left ventricular relaxation (grade 1 diastolic   dysfunction). - Ventricular septum: The contour showed systolic flattening. These   changes are consistent with RV pressure overload. - Aortic valve: Valve area (VTI): 2.63 cm^2. Valve area (Vmax):   2.58 cm^2. Valve area (Vmean): 2.4 cm^2. - Right ventricle: The cavity size was moderately to severely   dilated. Wall thickness was normal. Systolic function was   severely reduced. - Right atrium: The atrium was moderately to severely dilated. - Tricuspid valve: There was moderate regurgitation. - Pulmonary arteries: Systolic pressure was moderately to severely   increased. PA peak pressure: 76 mm Hg (S). - Pericardium, extracardiac:  moderate to large loculated posterior   pericardial effusion. THe IVC does not collapse, but the flow   across TV is phasic and suggest tamponade not present.   There was no chamber collapse. Respirophasic change in stroke   volume was normal.    Left heart catheterization  5/13   Mid Cx lesion is 95% stenosed.  Prox RCA to Mid RCA lesion is 40% stenosed.  RPDA lesion is 40% stenosed.  Prox Cx lesion is 40% stenosed.  Prox LAD lesion is 60% stenosed.  Ost LAD to Prox LAD lesion is 40% stenosed.  Mid LAD to Dist LAD lesion is 40% stenosed.  A stent was successfully placed.  Post intervention, there is a 0% residual stenosis.   Findings:  Ao = 139/81 (105) LV = 147/14 RA = 9 RV = 75/15 PA = 74/31 (46) PCW = 13 Fick cardiac output/index = 6.8/3.6 PVR = 4.9 WU Ao sat = 92% PA sat = 69%, 70%  Assessment: 1. 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 Normal LV function EF 60%  Plan/Discussion:  Plan PCI of LCX. Will need lung transplant eval. Not candidate for selective pulmonary artery vasodilators with WHO Group III PAH.       Antimicrobials:   None   Subjective: No new dyspnea.  No cough, no sputum, no fever.  No chest pain.  No confusion, dizziness, diaphoresis.     Objective: Vitals:   08/22/17 1108 08/22/17 1113 08/22/17 1118 08/22/17 1200  BP: (!) 171/100 (!) 156/100  138/77  Pulse: 93 91 (!) 0 89  Resp:    20  Temp:    98.1 F (36.7 C)  TempSrc:    Oral  SpO2:    92%  Weight:      Height:        Intake/Output Summary (Last 24 hours) at 08/22/2017 1330 Last data filed at 08/22/2017 1258 Gross per 24 hour  Intake 35.83 ml  Output 2070 ml  Net -2034.17 ml   Filed Weights   08/21/17 0435 08/22/17 0033 08/22/17 0545  Weight: 73.5 kg (162 lb 0.6 oz) 74.3 kg (163 lb 12.8 oz) 74.8 kg (164 lb 14.5 oz)    Examination: General appearance: Male, lying in bed, interactive, no acute distress HEENT: Sclera anicteric, conjunctive are pink, lids and lashes normal.  No nasal deformity, discharge, or epistaxis.  Lips moist, dentures in place.  Oropharynx moist without lesions. Skin: Skin warm and dry without jaundice.  No suspicious rashes on  the face, neck, back, upper chest, arms or legs. Cardiac: RRR, normal S1 and S2, no murmurs.  No lower extremity edema.    Respiratory: Respirations easy nonlabored, no rales or wheezes.    Abdomen: Soft without tenderness to palpation, no distention, ascites, or splenomegaly MSK: No deformities or effusions. Neuro: Alert and oriented to person, place, time.  Extraocular movements intact, PERRLA.  Cranial nerves normal, sensation intact, speech fluent.    Psych: Sensorium intact responding to questions, attention and affect normal, judgment and insight normal.   Data Reviewed: I have personally reviewed following labs and imaging studies:  CBC: Recent Labs  Lab 08/17/17 2308 08/19/17 0317 08/20/17 0244 08/21/17 0303 08/22/17 0415  WBC 13.1* 16.0* 17.0* 14.9* 14.7*  NEUTROABS 10.0*  --   --   --   --   HGB 11.3* 10.8* 11.4* 11.4* 12.3*  HCT 36.4* 35.3* 36.7* 36.6* 39.4  MCV 89.4 89.1 89.7 88.6  90.0  PLT 203 180 199 183 183   Basic Metabolic Panel: Recent Labs  Lab 08/17/17 2308 08/19/17 0317 08/20/17 0244 08/21/17 0303 08/22/17 0415  NA 135 139 137 138 140  K 3.9 5.5* 4.7 4.5 4.1  CL 103 102 98* 100* 104  CO2 GLUCOSE 155* 161* 154* 146* 101*  BUN 14 24* 29* 26* 17  CREATININE 1.20 1.24 1.22 1.01 1.07  CALCIUM 8.5* 9.1 9.0 9.0 8.4*   GFR: Estimated Creatinine Clearance: 65.7 mL/min (by C-G formula based on SCr of 1.07 mg/dL). Liver Function Tests: Recent Labs  Lab 08/17/17 2308  AST 21  ALT 14*  ALKPHOS 65  BILITOT 0.8  PROT 6.4*  ALBUMIN 3.2*   No results for input(s): LIPASE, AMYLASE in the last 168 hours. No results for input(s): AMMONIA in the last 168 hours. Coagulation Profile: Recent Labs  Lab 08/20/17 1343  INR 1.14   Cardiac Enzymes: No results for input(s): CKTOTAL, CKMB, CKMBINDEX, TROPONINI in the last 168 hours. BNP (last 3 results) No results for input(s): PROBNP in the last 8760 hours. HbA1C: No results for input(s):  HGBA1C in the last 72 hours. CBG: Recent Labs  Lab 08/19/17 0925 08/21/17 0741 08/22/17 0742  GLUCAP 249* 115* 81   Lipid Profile: No results for input(s): CHOL, HDL, LDLCALC, TRIG, CHOLHDL, LDLDIRECT in the last 72 hours. Thyroid Function Tests: No results for input(s): TSH, T4TOTAL, FREET4, T3FREE, THYROIDAB in the last 72 hours. Anemia Panel: No results for input(s): VITAMINB12, FOLATE, FERRITIN, TIBC, IRON, RETICCTPCT in the last 72 hours. Urine analysis: No results found for: COLORURINE, APPEARANCEUR, LABSPEC, PHURINE, GLUCOSEU, HGBUR, BILIRUBINUR, KETONESUR, PROTEINUR, UROBILINOGEN, NITRITE, LEUKOCYTESUR Sepsis Labs: (procalcitonin:4,lacticacidven:4)  ) Recent Results (from the past 240 hour(s))  MRSA PCR Screening     Status: None   Collection Time: 08/18/17  4:50 PM  Result Value Ref Range Status   MRSA by PCR NEGATIVE NEGATIVE Final    Comment:        The GeneXpert MRSA Assay (FDA approved for NASAL specimens only), is one component of a comprehensive MRSA colonization surveillance program. It is not intended to diagnose MRSA infection nor to guide or monitor treatment for MRSA infections. Performed at [facility], [address], [place], [place] [address]          Radiology Studies: No results found.      Scheduled Meds: . [START ON 08/23/2017] aspirin  81 mg Oral Daily  . [START ON 08/23/2017] clopidogrel  75 mg Oral Q breakfast  . enoxaparin (LOVENOX) injection  40 mg Subcutaneous Q24H  . fluticasone  1 spray Each Nare Daily  . ipratropium-albuterol  3 mL Nebulization TID  . losartan  100 mg Oral Daily  . methylPREDNISolone (SOLU-MEDROL) injection  40 mg Intravenous Daily  . multivitamin with minerals  1 tablet Oral Daily  . pantoprazole  40 mg Oral BID AC  . sodium chloride flush  3 mL Intravenous Q12H  . sodium chloride flush  3 mL Intravenous Q12H  . sodium chloride flush  3 mL Intravenous Q12H  . traZODone  50 mg Oral QHS    Continuous Infusions: . sodium chloride    . sodium chloride 75 mL/hr at 08/22/17 1200  . sodium chloride    . bivalirudin (ANGIOMAX) infusion 5 mg/mL (Cath Lab,ACS,PCI indication) 1.75 mg/kg/hr (08/22/17 1200)     LOS: 4 days    Time spent: 25 minutes    [employer],  MD [facility] 08/22/2017, 8:30 AM     Pager [phone] --- please page though AMION:  [email or web address] Password [email or web address] If 7PM-7AM, please contact night-coverage

## 2017-08-22 NOTE — Care Management Important Message (Signed)
Important Message  Patient Details  Name: Jose Flores MRN: [id number] Date of Birth: 1951/01/10   Medicare Important Message Given:  Yes    Dorena Bodo 08/22/2017, 3:45 PM

## 2017-08-22 NOTE — Progress Notes (Signed)
Pt has refused cpap at this time.  RT will continue to monitor. 

## 2017-08-22 NOTE — Plan of Care (Signed)
Discussed plan of care with patient.  Patient is eager to engage in activities without oxygen levels dropping.  Patient understand the benefit of grouping certain activities together to preserve energy.  Patient displayed good teach-back.

## 2017-08-22 NOTE — Interval H&P Note (Signed)
History and Physical Interval Note:  08/22/2017 9:04 AM  Jose Flores  has presented today for surgery, with the diagnosis of HF  The various methods of treatment have been discussed with the patient and family. After consideration of risks, benefits and other options for treatment, the patient has consented to  Procedure(s): RIGHT HEART CATH (N/A) as a surgical intervention .  The patient's history has been reviewed, patient examined, no change in status, stable for surgery.  I have reviewed the patient's chart and labs.  Questions were answered to the patient's satisfaction.     Daniel Bensimhon

## 2017-08-22 NOTE — Progress Notes (Signed)
cpap refused at this time.  Rt will monitor.

## 2017-08-22 NOTE — Progress Notes (Signed)
PER MD sat goal 88-92%. RT placed pt on 6L [place]

## 2017-08-23 ENCOUNTER — Encounter (HOSPITAL_COMMUNITY): Payer: Self-pay | Admitting: *Deleted

## 2017-08-23 ENCOUNTER — Emergency Department (HOSPITAL_COMMUNITY): Payer: Medicare PPO

## 2017-08-23 ENCOUNTER — Observation Stay (HOSPITAL_COMMUNITY)
Admission: EM | Admit: 2017-08-23 | Discharge: 2017-08-24 | Disposition: A | Discharge status: Home / Self Care | Payer: Medicare PPO | Attending: Internal Medicine | Admitting: Internal Medicine

## 2017-08-23 DIAGNOSIS — I10 Essential (primary) hypertension: Secondary | ICD-10-CM | POA: Diagnosis present

## 2017-08-23 DIAGNOSIS — R0902 Hypoxemia: Secondary | ICD-10-CM | POA: Diagnosis present

## 2017-08-23 DIAGNOSIS — Z7951 Long term (current) use of inhaled steroids: Secondary | ICD-10-CM | POA: Insufficient documentation

## 2017-08-23 DIAGNOSIS — K219 Gastro-esophageal reflux disease without esophagitis: Secondary | ICD-10-CM | POA: Insufficient documentation

## 2017-08-23 DIAGNOSIS — J8410 Pulmonary fibrosis, unspecified: Secondary | ICD-10-CM | POA: Insufficient documentation

## 2017-08-23 DIAGNOSIS — J449 Chronic obstructive pulmonary disease, unspecified: Secondary | ICD-10-CM | POA: Insufficient documentation

## 2017-08-23 DIAGNOSIS — I272 Pulmonary hypertension, unspecified: Secondary | ICD-10-CM | POA: Insufficient documentation

## 2017-08-23 DIAGNOSIS — Z7902 Long term (current) use of antithrombotics/antiplatelets: Secondary | ICD-10-CM | POA: Insufficient documentation

## 2017-08-23 DIAGNOSIS — Z955 Presence of coronary angioplasty implant and graft: Secondary | ICD-10-CM | POA: Insufficient documentation

## 2017-08-23 DIAGNOSIS — Z9981 Dependence on supplemental oxygen: Secondary | ICD-10-CM | POA: Insufficient documentation

## 2017-08-23 DIAGNOSIS — I110 Hypertensive heart disease with heart failure: Secondary | ICD-10-CM | POA: Insufficient documentation

## 2017-08-23 DIAGNOSIS — R079 Chest pain, unspecified: Secondary | ICD-10-CM

## 2017-08-23 DIAGNOSIS — I50813 Acute on chronic right heart failure: Secondary | ICD-10-CM

## 2017-08-23 DIAGNOSIS — Z7982 Long term (current) use of aspirin: Secondary | ICD-10-CM | POA: Insufficient documentation

## 2017-08-23 DIAGNOSIS — Z79899 Other long term (current) drug therapy: Secondary | ICD-10-CM | POA: Insufficient documentation

## 2017-08-23 DIAGNOSIS — I2510 Atherosclerotic heart disease of native coronary artery without angina pectoris: Secondary | ICD-10-CM | POA: Insufficient documentation

## 2017-08-23 DIAGNOSIS — R040 Epistaxis: Principal | ICD-10-CM | POA: Insufficient documentation

## 2017-08-23 DIAGNOSIS — Z87891 Personal history of nicotine dependence: Secondary | ICD-10-CM | POA: Insufficient documentation

## 2017-08-23 DIAGNOSIS — J849 Interstitial pulmonary disease, unspecified: Secondary | ICD-10-CM | POA: Diagnosis present

## 2017-08-23 LAB — COMPREHENSIVE METABOLIC PANEL
ALT: 35 U/L (ref 17–63)
AST: 32 U/L (ref 15–41)
Albumin: 3.7 g/dL (ref 3.5–5.0)
Alkaline Phosphatase: 75 U/L (ref 38–126)
Anion gap: 12 (ref 5–15)
BUN: 25 mg/dL — AB (ref 6–20)
CHLORIDE: 99 mmol/L — AB (ref 101–111)
CO2: 26 mmol/L (ref 22–32)
CREATININE: 1.61 mg/dL — AB (ref 0.61–1.24)
Calcium: 9.4 mg/dL (ref 8.9–10.3)
GFR calc Af Amer: 50 mL/min — ABNORMAL LOW (ref >60)
GFR calc non Af Amer: 43 mL/min — ABNORMAL LOW (ref >60)
GLUCOSE: 149 mg/dL — AB (ref 65–99)
POTASSIUM: 5.7 mmol/L — AB (ref 3.5–5.1)
SODIUM: 137 mmol/L (ref 135–145)
Total Bilirubin: 0.8 mg/dL (ref 0.3–1.2)
Total Protein: 7.3 g/dL (ref 6.5–8.1)

## 2017-08-23 LAB — CBC WITH DIFFERENTIAL/PLATELET
ABS IMMATURE GRANULOCYTES: 0.3 10*3/uL — AB (ref 0.0–0.1)
BASOS PCT: 1 %
Basophils Absolute: 0.1 10*3/uL (ref 0.0–0.1)
EOS ABS: 0 10*3/uL (ref 0.0–0.7)
Eosinophils Relative: 0 %
HCT: 49.3 % (ref 39.0–52.0)
Hemoglobin: 15.4 g/dL (ref 13.0–17.0)
IMMATURE GRANULOCYTES: 2 %
Lymphocytes Relative: 3 %
Lymphs Abs: 0.7 10*3/uL (ref 0.7–4.0)
MCH: 27.5 pg (ref 26.0–34.0)
MCHC: 31.2 g/dL (ref 30.0–36.0)
MCV: 88 fL (ref 78.0–100.0)
MONOS PCT: 3 %
Monocytes Absolute: 0.6 10*3/uL (ref 0.1–1.0)
NEUTROS ABS: 20.7 10*3/uL — AB (ref 1.7–7.7)
NEUTROS PCT: 91 %
PLATELETS: 306 10*3/uL (ref 150–400)
RBC: 5.6 MIL/uL (ref 4.22–5.81)
RDW: 14.1 % (ref 11.5–15.5)
WBC: 22.5 10*3/uL — AB (ref 4.0–10.5)

## 2017-08-23 LAB — CBC
HCT: 41.4 % (ref 39.0–52.0)
Hemoglobin: 13.4 g/dL (ref 13.0–17.0)
MCH: 28.6 pg (ref 26.0–34.0)
MCHC: 32.4 g/dL (ref 30.0–36.0)
MCV: 88.3 fL (ref 78.0–100.0)
PLATELETS: 208 10*3/uL (ref 150–400)
RBC: 4.69 MIL/uL (ref 4.22–5.81)
RDW: 14.4 % (ref 11.5–15.5)
WBC: 18.3 10*3/uL — AB (ref 4.0–10.5)

## 2017-08-23 LAB — BASIC METABOLIC PANEL
Anion gap: 8 (ref 5–15)
BUN: 20 mg/dL (ref 6–20)
CHLORIDE: 100 mmol/L — AB (ref 101–111)
CO2: 29 mmol/L (ref 22–32)
CREATININE: 1.45 mg/dL — AB (ref 0.61–1.24)
Calcium: 8.5 mg/dL — ABNORMAL LOW (ref 8.9–10.3)
GFR calc Af Amer: 56 mL/min — ABNORMAL LOW (ref >60)
GFR calc non Af Amer: 49 mL/min — ABNORMAL LOW (ref >60)
Glucose, Bld: 116 mg/dL — ABNORMAL HIGH (ref 65–99)
Potassium: 4.1 mmol/L (ref 3.5–5.1)
SODIUM: 137 mmol/L (ref 135–145)

## 2017-08-23 LAB — PROTIME-INR
INR: 1.07
PROTHROMBIN TIME: 13.8 s (ref 11.4–15.2)

## 2017-08-23 MED ORDER — PREDNISONE 10 MG PO TABS: ORAL_TABLET | ORAL | 0 refills | Status: AC

## 2017-08-23 MED ORDER — CLOPIDOGREL BISULFATE 75 MG PO TABS: 75.0000 mg | ORAL_TABLET | Freq: Every day | ORAL | 3 refills | Status: AC

## 2017-08-23 MED ORDER — ASPIRIN 81 MG PO CHEW: 81.0000 mg | CHEWABLE_TABLET | Freq: Every day | ORAL | 3 refills | Status: AC

## 2017-08-23 MED ORDER — ATORVASTATIN CALCIUM 40 MG PO TABS: 40.0000 mg | ORAL_TABLET | Freq: Every day | ORAL | Status: DC

## 2017-08-23 MED ORDER — ATORVASTATIN CALCIUM 40 MG PO TABS: 40.0000 mg | ORAL_TABLET | Freq: Every day | ORAL | 3 refills | Status: AC

## 2017-08-23 NOTE — Progress Notes (Signed)
Patient's sats were 100% post nebulizer treatment.  Decreased cannula to 6L due to oxygen goal of 88-92%.  Patient currently at 95%.  Patient states he wears 6L at home but would like to get down to 4L if possible due to his prescription being for 2-3L.  Will continue to monitor and wean as tolerated.

## 2017-08-23 NOTE — ED Provider Notes (Signed)
[place] EMERGENCY DEPARTMENT Provider Note   CSN: [id number] Arrival date & time: 08/23/17  Jose Flores   History   Chief Complaint Chief Complaint  Patient presents with  . Epistaxis    HPI Jose Flores is a 67 y.o. male.  HPI  Patient is a 67 year old male who comes to the ED after being discharged to the hospital earlier today.  Patient has a history of pulmonary fibrosis and is normally on nasal cannula 6 L at home.  Patient had a graft placed while admitted in his right forearm.  Patient was started on Plavix.  Patient states that he started having a nosebleed prior to leaving the hospital for which they placed nasal packing and he was sent home.  However upon returning home, patient was not receiving oxygen via nasal cannula secondary to his epistaxis and return to the hospital with oxygen saturations in the 70s.  Patient was placed on nonrebreather with improvement of his saturation 100% after which she was transitioned to nasal cannula however nasal cannula was placed in the mouth with oxygen saturation approximately 97.  Past Medical History:  Diagnosis Date  . Acid reflux   . COPD (chronic obstructive pulmonary disease) ([place])   . Coronary artery disease   . Hypertension   . Insomnia   . Pulmonary fibrosis [place])     Patient Active Problem List   Diagnosis Date Noted  . Acute on chronic right-sided heart failure ([place])   . Pericardial effusion 08/18/2017  . Acute on chronic respiratory failure with hypoxia ([place])   . Essential hypertension   . Malnutrition of moderate degree 05/20/2017  . SOB (shortness of breath)   . COPD (chronic obstructive pulmonary disease) ([place])   . Interstitial lung disease ([place])   . Hypoxia 05/16/2017  . Hypoxic episode 05/16/2017    Past Surgical History:  Procedure Laterality Date  . CATARACT EXTRACTION Left   . CORONARY STENT INTERVENTION N/A 08/22/2017   Procedure: CORONARY STENT INTERVENTION;  Surgeon: Jolaine Artist, MD;  Location: [place] [facility];  Service: Cardiovascular;  Laterality: N/A;  . CORONARY STENT INTERVENTION N/A 08/22/2017   Procedure: CORONARY STENT INTERVENTION;  Surgeon: Lorretta Harp, MD;  Location: [place] [facility];  Service: Cardiovascular;  Laterality: N/A;  . HERNIA REPAIR    . RIGHT/LEFT HEART CATH AND CORONARY ANGIOGRAPHY N/A 08/22/2017   Procedure: RIGHT/LEFT HEART CATH AND CORONARY ANGIOGRAPHY;  Surgeon: Jolaine Artist, MD;  Location: [place] [facility];  Service: Cardiovascular;  Laterality: N/A;        Home Medications    Prior to Admission medications   Medication Sig Start Date End Date Taking? Authorizing Provider  acetaminophen (TYLENOL) 500 MG tablet Take 500 mg by mouth 3 (three) times daily as needed for moderate pain (Take along with Tramadol).   Yes [provider]  albuterol (PROVENTIL HFA;VENTOLIN HFA) 108 (90 Base) MCG/ACT inhaler Inhale 2 puffs into the lungs every 6 (six) hours as needed for wheezing or shortness of breath.   Yes [provider]  amLODipine (NORVASC) 10 MG tablet Take 10 mg by mouth daily.   Yes [provider]  aspirin 81 MG chewable tablet Chew 1 tablet (81 mg total) by mouth daily. 08/23/17  Yes Danford, Suann Larry, MD  atorvastatin (LIPITOR) 40 MG tablet Take 1 tablet (40 mg total) by mouth daily at 6 PM. 08/23/17  Yes Danford, Suann Larry, MD  augmented betamethasone dipropionate (DIPROLENE-AF) 0.05 % cream Apply  1 application topically 2 (two) times daily. To upper arms   Yes [provider]  budesonide-formoterol (SYMBICORT) 160-4.5 MCG/ACT inhaler Inhale 2 puffs into the lungs 2 (two) times daily.   Yes [provider]  clopidogrel (PLAVIX) 75 MG tablet Take 1 tablet (75 mg total) by mouth daily with breakfast. 08/23/17  Yes Danford, Suann Larry, MD  clotrimazole (MYCELEX) 10 MG troche Take 10 mg by mouth daily as needed (for thrush).   Yes [provider]    DULoxetine (CYMBALTA) 30 MG capsule Take 30 mg by mouth daily.   Yes [provider]  fluticasone (FLONASE) 50 MCG/ACT nasal spray Place 1 spray into both nostrils daily.   Yes [provider]  guaiFENesin (ROBITUSSIN) 100 MG/5ML SOLN Take 10 mLs by mouth 2 (two) times daily.   Yes [provider]  Hypertonic Nasal Wash (SINUS RINSE KIT NA) Place 1 packet into the nose 2 (two) times daily.   Yes [provider]  ipratropium (ATROVENT) 0.03 % nasal spray Place 2 sprays into both nostrils every 12 (twelve) hours.   Yes [provider]  ipratropium-albuterol (DUONEB) 0.5-2.5 (3) MG/3ML SOLN Take 3 mLs by nebulization every 6 (six) hours.   Yes [provider]  losartan (COZAAR) 100 MG tablet Take 100 mg by mouth daily.   Yes [provider]  Multiple Vitamin (MULTIVITAMIN WITH MINERALS) TABS tablet Take 1 tablet by mouth daily.   Yes [provider]  nystatin (MYCOSTATIN) 100000 UNIT/ML suspension Use as directed 5 mLs in the mouth or throat See admin instructions. Swish for 5 minutes then swallow four times daily as needed for thrush   Yes [provider]  omeprazole (PRILOSEC) 20 MG capsule Take 20 mg by mouth daily.   Yes [provider]  polyvinyl alcohol (LIQUIFILM TEARS) 1.4 % ophthalmic solution Place 1 drop into both eyes every 4 (four) hours as needed for dry eyes.   Yes [provider]  predniSONE (DELTASONE) 10 MG tablet Take 3 tablets (30 mg total) by mouth daily for 2 days, THEN 2 tablets (20 mg total) daily for 2 days, THEN 1 tablet (10 mg total) daily for 2 days. 08/23/17 08/29/17 Yes Danford, Suann Larry, MD  tiotropium (SPIRIVA) 18 MCG inhalation capsule Place 18 mcg into inhaler and inhale daily.   Yes [provider]  traMADol (ULTRAM) 50 MG tablet Take 50 mg by mouth 3 (three) times daily as needed for moderate pain (Take along with Acetaminophen).   Yes [provider]   traZODone (DESYREL) 50 MG tablet Take 50 mg by mouth at bedtime.   Yes [provider]    Family History Family History  Problem Relation Age of Onset  . Colon cancer Mother   . Heart failure Father   . Breast cancer Sister     Social History Social History   Tobacco Use  . Smoking status: Former Research scientist (life sciences)  . Smokeless tobacco: Never Used  . Tobacco comment: quit smoking in 1993  Substance Use Topics  . Alcohol use: Yes    Comment: occ  . Drug use: No     Allergies   Gabapentin; Lisinopril; and Penicillins   Review of Systems Review of Systems  Constitutional: Positive for fatigue. Negative for chills and fever.  HENT: Positive for nosebleeds.   Respiratory: Positive for shortness of breath.   Cardiovascular: Negative for chest pain.  All other systems reviewed and are negative.    Physical Exam Updated Vital Signs  BP 108/77   Pulse (!) 104   Temp (!) 97.5 F (36.4 C) (Temporal)   Resp (!) 22   SpO2 (!) 76%   Physical Exam  Constitutional: He appears well-developed and well-nourished.  HENT:  Head: Normocephalic and atraumatic.  Dried blood in B/L nares   Eyes: Conjunctivae are normal.  Neck: Neck supple.  Cardiovascular: Normal rate and regular rhythm.  No murmur heard. Pulmonary/Chest: No respiratory distress.  Patient initially in restaurant distress until nonrebreather mask was placed.  Patient's bilateral breath sounds are diminished but likely unchanged from baseline given history of pulmonary fibrosis  Abdominal: Soft. There is no tenderness.  Musculoskeletal: He exhibits no edema.  Neurological: He is alert.  Skin: Skin is warm and dry.  Psychiatric: He has a normal mood and affect.  Nursing note and vitals reviewed.    ED Treatments / Results  Labs (all labs ordered are listed, but only abnormal results are displayed) Labs Reviewed  CBC WITH DIFFERENTIAL/PLATELET  COMPREHENSIVE METABOLIC PANEL  PROTIME-INR     EKG None  Radiology No results found.  Procedures Procedures (including critical care time)  Medications Ordered in ED Medications - No data to display   Initial Impression / Assessment and Plan / ED Course  I have reviewed the triage vital signs and the nursing notes.  Pertinent labs & imaging results that were available during my care of the patient were reviewed by me and considered in my medical decision making (see chart for details).  Clinical Course as of Aug 24 2011  Tue Aug 24, 1663  [id number] 67 year old male was just discharged today after cardiac cath and stenting.  He is newly on Plavix and had a nosebleed that was minor leaving the hospital today.  He is on nasal cannula oxygen.  Since being at home his nosebleed has worsened and his sats were in the 70s by the time he got here.  He is improved now with the packing in place and we are supplying an extra oxygen via facemask as his nose is partially occluded.  Checking some basic labs and chest x-ray and reevaluate for disposition.   [MB]    Clinical Course User Index [MB] Hayden Rasmussen, MD    Patient's laboratory work-up largely within normal limits without significant change from previous values from earlier today.  Unable to return patient home as he has a nasal CPAP machine which he will be unable to utilize given nasal packing and epistaxis.  Cannot stop Plavix given patient's new graft.  Attempted nasal cannula again however patient's oxygen saturation return to the 70s.  Will admit patient for further observation and care given inability to ensure sufficient oxygenation at home.  Final Clinical Impressions(s) / ED Diagnoses   Final diagnoses:  Chest pain in adult    ED Discharge Orders    None       Chapman Moss, MD 08/23/17 2344    Hayden Rasmussen, MD 08/26/17 1051

## 2017-08-23 NOTE — ED Triage Notes (Signed)
Pt reports onset of nosebleed this morning prior to d/c from the hospital after hospitalization for heart cath. Nosebleed has been on and off all day. Started on Plavix recently. Pt arrives to triage on home oxygen (6liters) with O2 sats between 72-79%.

## 2017-08-23 NOTE — Progress Notes (Signed)
CARDIAC REHAB PHASE I   PRE:  Rate/Rhythm: 100 SR bed, 108 sitting on side of bed  BP:  Supine: 118/64  Sitting:   Standing:    SaO2: 94% 6L  MODE:  Ambulation:   Outside of room ft   POST:  Rate/Rhythm: 118 ST  BP:  Supine:   Sitting: 138/78  Standing:    SaO2: 74% 10L  88% 15L   Stayed with pt until he recovered and then back to 6L 0900-0957 Pt only able to walk very short distances due to pulm issues. Has rollator and oxygen at home. Pt only able to walk outside of door with gait belt use, rolling walker and oxygen at 10L. Pt took his time and concentrated on breathing and still desat to 70's. Increased to 15 L to get sats up when pt back to bed. After pt recovered, decreased to 6L. Reviewed NTG use and purpose of plavix. Pt is not appropriate for CRP 2. Will refer to [place] but pt could not tolerate cardiac or pulmonary rehab at this time. Gave low sodium diets and discussed daily weights and when to call MD.   Luetta Nutting, RN BSN  08/23/2017 9:51 AM

## 2017-08-23 NOTE — H&P (Signed)
History and Physical    Jose Flores:[id number] DOB: 11/14/1950 DOA: 08/23/2017  Referring MD/NP/PA: Dr Melina Copa  PCP: [facility]   Outpatient Specialists: Jenkins Rouge, MD   Patient coming from: home  Chief Complaint: epistaxis  HPI: Jose Flores is a 67 y.o. male with medical history significant of interstitial lung disease and chronic oxygen by nasal cannula at home usually on 6 L. Patient was discharged this morning after admission where he was found to have multivessel coronary artery disease and had a cardiac stent to meet circumflex done yesterday. Patient is on aspirin and Plavix. He was using his oxygen at home when his daughter having epistaxis. Came to the ER with significant bleeding. Patient had his right nostril packed but is unable to keep his oxygen saturation off once he has a nasal cannula in place. All syndromes to 60%. At the moment he has the nasal cannula in his mouth maintaining his oxygen saturation in the 90s. Patient has to continue on Plavix and aspirin because of recent stent. He is being admitted for observation   ED Course: patient evaluated and found to have epistaxis. Potassium was 5.7 crit and 1.61 white count 22.5 thousand but hemoglobin 15.4. Patient had nasal packing done. Oxygen was 60% with the nasal cannula however above 90 with oral cannula. Patient is oxygen and CPAP dependent at home  Review of Systems: As per HPI otherwise 10 point review of systems negative.    Past Medical History:  Diagnosis Date  . Acid reflux   . COPD (chronic obstructive pulmonary disease) ([place])   . Coronary artery disease   . Hypertension   . Insomnia   . Pulmonary fibrosis ([place])     Past Surgical History:  Procedure Laterality Date  . CATARACT EXTRACTION Left   . CORONARY STENT INTERVENTION N/A 08/22/2017   Procedure: CORONARY STENT INTERVENTION;  Surgeon: Jolaine Artist, MD;  Location: [place] [facility];  Service: Cardiovascular;   Laterality: N/A;  . CORONARY STENT INTERVENTION N/A 08/22/2017   Procedure: CORONARY STENT INTERVENTION;  Surgeon: Lorretta Harp, MD;  Location: [place] [facility];  Service: Cardiovascular;  Laterality: N/A;  . HERNIA REPAIR    . RIGHT/LEFT HEART CATH AND CORONARY ANGIOGRAPHY N/A 08/22/2017   Procedure: RIGHT/LEFT HEART CATH AND CORONARY ANGIOGRAPHY;  Surgeon: Jolaine Artist, MD;  Location: [facility] [facility];  Service: Cardiovascular;  Laterality: N/A;     reports that he has quit smoking. He has never used smokeless tobacco. He reports that he drinks alcohol. He reports that he does not use drugs.  Allergies  Allergen Reactions  . Gabapentin Other (See Comments)    [facility]  . Lisinopril Other (See Comments)    [facility]  . Penicillins Other (See Comments)    Further details unknown per pt. Has patient had a PCN reaction causing immediate rash, facial/tongue/throat swelling, SOB or lightheadedness with hypotension: Unknown Has patient had a PCN reaction causing severe rash involving mucus membranes or skin necrosis: Unknown Has patient had a PCN reaction that required hospitalization: Yes Has patient had a PCN reaction occurring within the last 10 years: Unknown If all of the above answers are "NO", then may proceed with Cephalosporin use.     Family History  Problem Relation Age of Onset  . Colon cancer Mother   . Heart failure Father   . Breast cancer Sister     Prior to Admission medications   Medication Sig Start  Date End Date Taking? Authorizing Provider  acetaminophen (TYLENOL) 500 MG tablet Take 500 mg by mouth 3 (three) times daily as needed for moderate pain (Take along with Tramadol).   Yes [provider]  albuterol (PROVENTIL HFA;VENTOLIN HFA) 108 (90 Base) MCG/ACT inhaler Inhale 2 puffs into the lungs every 6 (six) hours as needed for wheezing or shortness of breath.   Yes [provider]  amLODipine (NORVASC) 10 MG tablet Take  10 mg by mouth daily.   Yes [provider]  aspirin 81 MG chewable tablet Chew 1 tablet (81 mg total) by mouth daily. 08/23/17  Yes Danford, Suann Larry, MD  atorvastatin (LIPITOR) 40 MG tablet Take 1 tablet (40 mg total) by mouth daily at 6 PM. 08/23/17  Yes Danford, Suann Larry, MD  augmented betamethasone dipropionate (DIPROLENE-AF) 0.05 % cream Apply 1 application topically 2 (two) times daily. To upper arms   Yes [provider]  budesonide-formoterol (SYMBICORT) 160-4.5 MCG/ACT inhaler Inhale 2 puffs into the lungs 2 (two) times daily.   Yes [provider]  clopidogrel (PLAVIX) 75 MG tablet Take 1 tablet (75 mg total) by mouth daily with breakfast. 08/23/17  Yes Danford, Suann Larry, MD  clotrimazole (MYCELEX) 10 MG troche Take 10 mg by mouth daily as needed (for thrush).   Yes [provider]  DULoxetine (CYMBALTA) 30 MG capsule Take 30 mg by mouth daily.   Yes [provider]  fluticasone (FLONASE) 50 MCG/ACT nasal spray Place 1 spray into both nostrils daily.   Yes [provider]  guaiFENesin (ROBITUSSIN) 100 MG/5ML SOLN Take 10 mLs by mouth 2 (two) times daily.   Yes [provider]  Hypertonic Nasal Wash (SINUS RINSE KIT NA) Place 1 packet into the nose 2 (two) times daily.   Yes [provider]  ipratropium (ATROVENT) 0.03 % nasal spray Place 2 sprays into both nostrils every 12 (twelve) hours.   Yes [provider]  ipratropium-albuterol (DUONEB) 0.5-2.5 (3) MG/3ML SOLN Take 3 mLs by nebulization every 6 (six) hours.   Yes [provider]  losartan (COZAAR) 100 MG tablet Take 100 mg by mouth daily.   Yes [provider]  Multiple Vitamin (MULTIVITAMIN WITH MINERALS) TABS tablet Take 1 tablet by mouth daily.   Yes [provider]  nystatin (MYCOSTATIN) 100000 UNIT/ML suspension Use as directed 5 mLs in the mouth or throat See admin instructions. Swish for 5 minutes then swallow  four times daily as needed for thrush   Yes [provider]  omeprazole (PRILOSEC) 20 MG capsule Take 20 mg by mouth daily.   Yes [provider]  polyvinyl alcohol (LIQUIFILM TEARS) 1.4 % ophthalmic solution Place 1 drop into both eyes every 4 (four) hours as needed for dry eyes.   Yes [provider]  predniSONE (DELTASONE) 10 MG tablet Take 3 tablets (30 mg total) by mouth daily for 2 days, THEN 2 tablets (20 mg total) daily for 2 days, THEN 1 tablet (10 mg total) daily for 2 days. 08/23/17 08/29/17 Yes Danford, Suann Larry, MD  tiotropium (SPIRIVA) 18 MCG inhalation capsule Place 18 mcg into inhaler and inhale daily.   Yes [provider]  traMADol (ULTRAM) 50 MG tablet Take 50 mg by mouth 3 (three) times daily as needed for moderate pain (Take along with Acetaminophen).   Yes [provider]  traZODone (DESYREL) 50 MG tablet Take 50 mg by mouth at bedtime.   Yes [provider]  Physical Exam: Vitals:   08/23/17 2200 08/23/17 2215 08/23/17 2230 08/23/17 2245  BP: 135/86 127/74 115/79 112/81  Pulse: 96 83 85 80  Resp: (!) 22 (!) _0 Temp:      TempSrc:      SpO2: 90% (!) 88% 93% 92%      Constitutional: NAD, calm, comfortable Vitals:   08/23/17 2200 08/23/17 2215 08/23/17 2230 08/23/17 2245  BP: 135/86 127/74 115/79 112/81  Pulse: 96 83 85 80  Resp: (!) 22 (!) _1 Temp:      TempSrc:      SpO2: 90% (!) 88% 93% 92%   Eyes: PERRL, lids and conjunctivae normal ENMT: Mucous membranes are moist. Posterior pharynx clear of any exudate or lesions.Normal dentition. Left Nostril packed Neck: normal, supple, no masses, no thyromegaly Respiratory: mild wheezing, no crackles. Normal respiratory effort. No accessory muscle use.  Cardiovascular: Regular rate and rhythm, no murmurs / rubs / gallops. No extremity edema. 2+ pedal pulses. No carotid bruits.  Abdomen: no tenderness, no masses palpated. No hepatosplenomegaly.  Bowel sounds positive.  Musculoskeletal: no clubbing / cyanosis. No joint deformity upper and lower extremities. Good ROM, no contractures. Normal muscle tone.  Skin: no rashes, lesions, ulcers. No induration Neurologic: CN 2-12 grossly intact. Sensation intact, DTR normal. Strength 5/5 in all 4.  Psychiatric: Normal judgment and insight. Alert and oriented x 3. Normal mood.   Labs on Admission: I have personally reviewed following labs and imaging studies  CBC: Recent Labs  Lab 08/17/17 2308  08/20/17 0244 08/21/17 0303 08/22/17 0415 08/23/17 0217 08/23/17 1944  WBC 13.1*   < > 17.0* 14.9* 14.7* 18.3* 22.5*  NEUTROABS 10.0*  --   --   --   --   --  20.7*  HGB 11.3*   < > 11.4* 11.4* 12.3* 13.4 15.4  HCT 36.4*   < > 36.7* 36.6* 39.4 41.4 49.3  MCV 89.4   < > 89.7 88.6 90.0 88.3 88.0  PLT 203   < > 199 183 183 208 306   < > = values in this interval not displayed.   Basic Metabolic Panel: Recent Labs  Lab 08/20/17 0244 08/21/17 0303 08/22/17 0415 08/23/17 0217 08/23/17 1944  NA 137 138 140 137 137  K 4.7 4.5 4.1 4.1 5.7*  CL 98* 100* 104 100* 99*  CO2 _2 GLUCOSE 154* 146* 101* 116* 149*  BUN 29* 26* 17 20 25*  CREATININE 1.22 1.01 1.07 1.45* 1.61*  CALCIUM 9.0 9.0 8.4* 8.5* 9.4   GFR: Estimated Creatinine Clearance: 43.7 mL/min (A) (by C-G formula based on SCr of 1.61 mg/dL (H)). Liver Function Tests: Recent Labs  Lab 08/17/17 2308 08/23/17 1944  AST 21 32  ALT 14* 35  ALKPHOS 65 75  BILITOT 0.8 0.8  PROT 6.4* 7.3  ALBUMIN 3.2* 3.7   No results for input(s): LIPASE, AMYLASE in the last 168 hours. No results for input(s): AMMONIA in the last 168 hours. Coagulation Profile: Recent Labs  Lab 08/20/17 1343 08/23/17 1944  INR 1.14 1.07   Cardiac Enzymes: No results for input(s): CKTOTAL, CKMB, CKMBINDEX, TROPONINI in the last 168 hours. BNP (last 3 results) No results for input(s): PROBNP in the last 8760 hours. HbA1C: No results for  input(s): HGBA1C in the last 72 hours. CBG: Recent Labs  Lab 08/19/17 0925 08/21/17 0741 08/22/17 0742  GLUCAP 249* 115* 81   Lipid Profile: No results for input(s):  CHOL, HDL, LDLCALC, TRIG, CHOLHDL, LDLDIRECT in the last 72 hours. Thyroid Function Tests: No results for input(s): TSH, T4TOTAL, FREET4, T3FREE, THYROIDAB in the last 72 hours. Anemia Panel: No results for input(s): VITAMINB12, FOLATE, FERRITIN, TIBC, IRON, RETICCTPCT in the last 72 hours. Urine analysis: No results found for: COLORURINE, APPEARANCEUR, [place], [place], GLUCOSEU, HGBUR, BILIRUBINUR, KETONESUR, PROTEINUR, UROBILINOGEN, NITRITE, LEUKOCYTESUR Sepsis Labs: _0 (procalcitonin:4,lacticidven:4) ) Recent Results (from the past 240 hour(s))  MRSA PCR Screening     Status: None   Collection Time: 08/18/17  4:50 PM  Result Value Ref Range Status   MRSA by PCR NEGATIVE NEGATIVE Final    Comment:        The GeneXpert MRSA Assay (FDA approved for NASAL specimens only), is one component of a comprehensive MRSA colonization surveillance program. It is not intended to diagnose MRSA infection nor to guide or monitor treatment for MRSA infections. Performed at [facility], [place] [address], [place], [place] [address]      Radiological Exams on Admission: Dg Chest Port 1 View  Result Date: 08/23/2017 CLINICAL DATA:  Epistaxis EXAM: PORTABLE CHEST 1 VIEW COMPARISON:  08/17/2017, CT chest 08/18/2017, 05/16/2017 radiograph FINDINGS: Bilateral fibrosis, prominent in the lung apices but without significant change. Cardiomegaly. No acute opacity or pleural effusion. No pneumothorax. IMPRESSION: Similar appearance of bilateral pulmonary fibrosis without acute opacity. Cardiomegaly Electronically Signed   By: Donavan Foil M.D.   On: 08/23/2017 20:42    EKG: Independently reviewed. Sinus rhythm with lateral T-wave inversion not changed from previous  Assessment/Plan Principal Problem:   Epistaxis Active  Problems:   Hypoxia   Interstitial lung disease ([place])   Essential hypertension    #1 epistaxis: Bleeding has not stopped with packing in place. Patient has to continue Plavix with the new stent placed. No other anticoagulants will be started. Patient will need his nasal cannula however we will use it to the oral route. It appears he is maintaining oxygen sats in the 90s leg that. If bleeding persists or returns we may get ENT consult  #2 interstitial lung disease: Continue oxygenation.  #3 coronary artery disease:patient has multivessel disease. Status post cardiac stent to Mid Cx lesion is 95% stenosed and had PCI done yesterday. Continue treatment.  #4 pulmonary hypertension: Secondary to interstitial lung disease. Continue treatment   DVT prophylaxis: SCD  Code Status: full Family Communication: wife at bedside  Disposition Plan: home when bleeding stops  Consults called: none  Admission status: observation   Severity of Illness: The appropriate patient status for this patient is OBSERVATION. Observation status is judged to be reasonable and necessary in order to provide the required intensity of service to ensure the patient's safety. The patient's presenting symptoms, physical exam findings, and initial radiographic and laboratory data in the context of their medical condition is felt to place them at decreased risk for further clinical deterioration. Furthermore, it is anticipated that the patient will be medically stable for discharge from the hospital within 2 midnights of admission. The following factors support the patient status of observation.   " The patient's presenting symptoms include epistaxis with hypoxia. " The physical exam findings include epistaxis from the left nostril. " The initial radiographic and laboratory data are interstitial lung disease oxygen dependent.     Barbette Merino MD [facility] Pager [phone]  If 7PM-7AM, please contact  night-coverage [email or web address] Password [facility]  08/23/2017, 11:02 PM

## 2017-08-23 NOTE — Progress Notes (Signed)
[facility] BAND REMOVAL  LOCATION:    right radial  DEFLATED PER PROTOCOL:    Yes.    TIME BAND OFF / DRESSING APPLIED:    0005   SITE UPON ARRIVAL:    Level 1  SITE AFTER BAND REMOVAL:    Level 1  CIRCULATION SENSATION AND MOVEMENT:    Within Normal Limits   Yes.    COMMENTS:   Pt.tolerated procedure well

## 2017-08-23 NOTE — Progress Notes (Addendum)
Progress Note  Patient Name: Jose Flores Date of Encounter: 08/23/2017  Primary Cardiologist: New to [facility], He will establish care at [facility]  Subjective   No chest pain. Had SOB since last night.   Inpatient Medications    Scheduled Meds: . aspirin  81 mg Oral Daily  . clopidogrel  75 mg Oral Q breakfast  . enoxaparin (LOVENOX) injection  40 mg Subcutaneous Q24H  . fluticasone  1 spray Each Nare Daily  . ipratropium-albuterol  3 mL Nebulization TID  . losartan  100 mg Oral Daily  . methylPREDNISolone (SOLU-MEDROL) injection  40 mg Intravenous Daily  . multivitamin with minerals  1 tablet Oral Daily  . pantoprazole  40 mg Oral BID AC  . traZODone  50 mg Oral QHS   Continuous Infusions:  PRN Meds: acetaminophen, albuterol, bisacodyl, morphine injection, ondansetron (ZOFRAN) IV, oxyCODONE, polyvinyl alcohol, senna-docusate, traMADol   Vital Signs    Vitals:   08/22/17 2016 08/22/17 2107 08/23/17 0500 08/23/17 0611  BP: 135/80   105/72  Pulse: 92   85  Resp: (!) 22   18  Temp: 98.2 F (36.8 C)   97.6 F (36.4 C)  TempSrc: Oral   Oral  SpO2: 92% 98%  98%  Weight:   166 lb 3.6 oz (75.4 kg)   Height:        Intake/Output Summary (Last 24 hours) at 08/23/2017 0749 Last data filed at 08/23/2017 0612 Gross per 24 hour  Intake 1100 ml  Output 2250 ml  Net -1150 ml   Filed Weights   08/22/17 0033 08/22/17 0545 08/23/17 0500  Weight: 163 lb 12.8 oz (74.3 kg) 164 lb 14.5 oz (74.8 kg) 166 lb 3.6 oz (75.4 kg)    Telemetry    SR at rate of 90s - Personally Reviewed  ECG    SR with inferior anterior lateral TWI - Personally Reviewed  Physical Exam   GEN: No acute distress.   Neck: No JVD Cardiac: RRR, no murmurs, rubs, or gallops. R radial site with hematoma and bruise Respiratory: faint crackles GI: Soft, nontender, non-distended  MS: No edema; No deformity. Neuro:  Nonfocal  Psych: Normal affect   Labs    Chemistry Recent Labs  Lab 08/17/17 2308   08/21/17 0303 08/22/17 0415 08/23/17 0217  NA 135   < > 138 140 137  K 3.9   < > 4.5 4.1 4.1  CL 103   < > 100* 104 100*  CO2 23   < > GLUCOSE 155*   < > 146* 101* 116*  BUN 14   < > 26* 17 20  CREATININE 1.20   < > 1.01 1.07 1.45*  CALCIUM 8.5*   < > 9.0 8.4* 8.5*  PROT 6.4*  --   --   --   --   ALBUMIN 3.2*  --   --   --   --   AST 21  --   --   --   --   ALT 14*  --   --   --   --   ALKPHOS 65  --   --   --   --   BILITOT 0.8  --   --   --   --   GFRNONAA >60   < > >60 >60 49*  GFRAA >60   < > >60 >60 56*  ANIONGAP 9   < > < > =  values in this interval not displayed.    Hematology Recent Labs  Lab 08/21/17 0303 08/22/17 0415 08/23/17 0217  WBC 14.9* 14.7* 18.3*  RBC 4.13* 4.38 4.69  HGB 11.4* 12.3* 13.4  HCT 36.6* 39.4 41.4  MCV 88.6 90.0 88.3  MCH 27.6 28.1 28.6  MCHC 31.1 31.2 32.4  RDW 14.1 14.5 14.4  PLT 183 183 208   Recent Labs  Lab 08/17/17 2311  TROPIPOC 0.03    BNP Recent Labs  Lab 08/17/17 2304  BNP 940.1*    DDimer  Recent Labs  Lab 08/17/17 2308  DDIMER 1.40*     Radiology    No results found.  Cardiac Studies   Echo 08/18/17 Study Conclusions  - Left ventricle: The cavity size was normal. Systolic function was   normal. The estimated ejection fraction was in the range of 60%   to 65%. Wall motion was normal; there were no regional wall   motion abnormalities. Doppler parameters are consistent with   abnormal left ventricular relaxation (grade 1 diastolic   dysfunction). - Ventricular septum: The contour showed systolic flattening. These   changes are consistent with RV pressure overload. - Aortic valve: Valve area (VTI): 2.63 cm^2. Valve area (Vmax):   2.58 cm^2. Valve area (Vmean): 2.4 cm^2. - Right ventricle: The cavity size was moderately to severely   dilated. Wall thickness was normal. Systolic function was   severely reduced. - Right atrium: The atrium was moderately to severely dilated. - Tricuspid  valve: There was moderate regurgitation. - Pulmonary arteries: Systolic pressure was moderately to severely   increased. PA peak pressure: 76 mm Hg (S). - Pericardium, extracardiac: moderate to large loculated posterior   pericardial effusion. THe IVC does not collapse, but the flow   across TV is phasic and suggest tamponade not present.   There was no chamber collapse. Respirophasic change in stroke   volume was normal.  CORONARY STENT INTERVENTION  08/22/17  RIGHT/LEFT HEART CATH AND CORONARY ANGIOGRAPHY  Conclusion     Mid Cx lesion is 95% stenosed.  Prox RCA to Mid RCA lesion is 40% stenosed.  RPDA lesion is 40% stenosed.  Prox Cx lesion is 40% stenosed.  Prox LAD lesion is 60% stenosed.  Ost LAD to Prox LAD lesion is 40% stenosed.  Mid LAD to Dist LAD lesion is 40% stenosed.  A stent was successfully placed.  Post intervention, there is a 0% residual stenosis.   Findings:  Ao = 139/81 (105) LV = 147/14 RA = 9 RV = 75/15 PA = 74/31 (46) PCW = 13 Fick cardiac output/index = 6.8/3.6 PVR = 4.9 WU Ao sat = 92% PA sat = 69%, 70%  Assessment: 1. 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 Normal LV function EF 60%  Plan/Discussion:  Plan PCI of LCX. Will need lung transplant eval. Not candidate for selective pulmonary artery vasodilators with WHO Group III PAH.    Diagnostic Diagram       Post-Intervention Diagram         Patient Profile     Jose Flores is a 67 y.o. male with a hx of chronic interstitial lung disease,  COPD, chronic hyporxic respiratory failure on 6 L oxygen and  HTN, who is admitted with pericardiac effusion and acute CHF.  Assessment & Plan   1. Pericardiac effusion - no evidence of tamponade.   2. 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 Diuresed well prior to cath. It showed moderate PAH due to pulmonary fibrosis.  Dr. Gala Romney recommended lung transplant eval. Per patient, he is undergoing eval at [facility]. Not candidate for selective pulmonary artery vasodilators with WHO Group III PAH.  - Net I & O -8.6L. Had some dyspnea since cath yesterday. Looks euvolemic. Diuretics per MD.   3. Pulmonary fibrosis - Recommended lung transplant  4. CAD - S/p DES to mCx otherwise non obstructive CAD. Continue DAPT with ASA and Plavix.  Given CAD will start statin. Check lipid panel.    He will establish cardiac care at [facility]   For questions or updates, please contact [facility] Please consult [email or web address] for contact info under Cardiology/STEMI.      SignedManson Passey, PA  08/23/2017, 7:49 AM    Patient examined chart reviewed Some bruising in right forearm distal to cath sight but not painful Lungs abnormal with interstitial fibrosis and crackles. No murmur no edema distal pulses palpable D[facility] home [facility]    Charlton Haws

## 2017-08-24 DIAGNOSIS — R0902 Hypoxemia: Secondary | ICD-10-CM

## 2017-08-24 DIAGNOSIS — J849 Interstitial pulmonary disease, unspecified: Secondary | ICD-10-CM | POA: Diagnosis not present

## 2017-08-24 DIAGNOSIS — R040 Epistaxis: Secondary | ICD-10-CM

## 2017-08-24 LAB — BASIC METABOLIC PANEL
Anion gap: 12 (ref 5–15)
BUN: 24 mg/dL — ABNORMAL HIGH (ref 6–20)
CO2: 24 mmol/L (ref 22–32)
CREATININE: 1.28 mg/dL — AB (ref 0.61–1.24)
Calcium: 8.6 mg/dL — ABNORMAL LOW (ref 8.9–10.3)
Chloride: 99 mmol/L — ABNORMAL LOW (ref 101–111)
GFR calc non Af Amer: 57 mL/min — ABNORMAL LOW (ref >60)
Glucose, Bld: 131 mg/dL — ABNORMAL HIGH (ref 65–99)
POTASSIUM: 4.5 mmol/L (ref 3.5–5.1)
Sodium: 135 mmol/L (ref 135–145)

## 2017-08-24 LAB — CBC
HEMATOCRIT: 42.6 % (ref 39.0–52.0)
Hemoglobin: 13.4 g/dL (ref 13.0–17.0)
MCH: 27.3 pg (ref 26.0–34.0)
MCHC: 31.5 g/dL (ref 30.0–36.0)
MCV: 86.8 fL (ref 78.0–100.0)
PLATELETS: 254 10*3/uL (ref 150–400)
RBC: 4.91 MIL/uL (ref 4.22–5.81)
RDW: 13.8 % (ref 11.5–15.5)
WBC: 21.4 10*3/uL — AB (ref 4.0–10.5)

## 2017-08-24 MED ORDER — CLOPIDOGREL BISULFATE 75 MG PO TABS
75.0000 mg | ORAL_TABLET | Freq: Every day | ORAL | Status: DC
  Administered 2017-08-24: 75 mg via ORAL
  Filled 2017-08-24: qty 1

## 2017-08-24 MED ORDER — GUAIFENESIN ER 600 MG PO TB12: 600.0000 mg | ORAL_TABLET | Freq: Two times a day (BID) | ORAL | Status: DC

## 2017-08-24 MED ORDER — DULOXETINE HCL 30 MG PO CPEP
30.0000 mg | ORAL_CAPSULE | Freq: Every day | ORAL | Status: DC
  Filled 2017-08-24: qty 1

## 2017-08-24 MED ORDER — IPRATROPIUM-ALBUTEROL 0.5-2.5 (3) MG/3ML IN SOLN
3.0000 mL | Freq: Four times a day (QID) | RESPIRATORY_TRACT | Status: DC
  Administered 2017-08-24 (×2): 3 mL via RESPIRATORY_TRACT
  Filled 2017-08-24 (×2): qty 3

## 2017-08-24 MED ORDER — AMLODIPINE BESYLATE 10 MG PO TABS
10.0000 mg | ORAL_TABLET | Freq: Every day | ORAL | Status: DC
  Administered 2017-08-24: 10 mg via ORAL
  Filled 2017-08-24: qty 1

## 2017-08-24 MED ORDER — SALINE SPRAY 0.65 % NA SOLN
1.0000 | Freq: Two times a day (BID) | NASAL | Status: DC | PRN
  Filled 2017-08-24: qty 44

## 2017-08-24 MED ORDER — POLYVINYL ALCOHOL 1.4 % OP SOLN
1.0000 [drp] | OPHTHALMIC | Status: DC | PRN
  Administered 2017-08-24: 1 [drp] via OPHTHALMIC
  Filled 2017-08-24: qty 15

## 2017-08-24 MED ORDER — PREDNISONE 10 MG PO TABS: 10.0000 mg | ORAL_TABLET | Freq: Every day | ORAL | Status: DC

## 2017-08-24 MED ORDER — PREDNISONE 20 MG PO TABS: 30.0000 mg | ORAL_TABLET | Freq: Every day | ORAL | Status: DC

## 2017-08-24 MED ORDER — TRIAMCINOLONE ACETONIDE 0.1 % EX OINT
1.0000 "application " | TOPICAL_OINTMENT | Freq: Two times a day (BID) | CUTANEOUS | Status: DC
  Filled 2017-08-24: qty 15

## 2017-08-24 MED ORDER — ALBUTEROL SULFATE HFA 108 (90 BASE) MCG/ACT IN AERS: 2.0000 | INHALATION_SPRAY | Freq: Four times a day (QID) | RESPIRATORY_TRACT | Status: DC | PRN

## 2017-08-24 MED ORDER — PREDNISONE 20 MG PO TABS: 20.0000 mg | ORAL_TABLET | Freq: Once | ORAL | Status: DC

## 2017-08-24 MED ORDER — CLOTRIMAZOLE 10 MG MT TROC
10.0000 mg | Freq: Every day | OROMUCOSAL | Status: DC | PRN
  Filled 2017-08-24: qty 1

## 2017-08-24 MED ORDER — OXYMETAZOLINE HCL 0.05 % NA SOLN
1.0000 | Freq: Two times a day (BID) | NASAL | Status: DC
  Administered 2017-08-24: 1 via NASAL

## 2017-08-24 MED ORDER — NYSTATIN 100000 UNIT/ML MT SUSP: 5.0000 mL | Freq: Four times a day (QID) | OROMUCOSAL | Status: DC | PRN

## 2017-08-24 MED ORDER — ADULT MULTIVITAMIN W/MINERALS CH
1.0000 | ORAL_TABLET | Freq: Every day | ORAL | Status: DC
  Administered 2017-08-24: 1 via ORAL
  Filled 2017-08-24: qty 1

## 2017-08-24 MED ORDER — OXYMETAZOLINE HCL 0.05 % NA SOLN: 1.0000 | NASAL | 0 refills | Status: AC | PRN

## 2017-08-24 MED ORDER — PANTOPRAZOLE SODIUM 40 MG PO TBEC
40.0000 mg | DELAYED_RELEASE_TABLET | Freq: Every day | ORAL | Status: DC
Start: 2017-08-24 — End: 2017-08-24
  Administered 2017-08-24: 40 mg via ORAL
  Filled 2017-08-24: qty 1

## 2017-08-24 MED ORDER — ALBUTEROL SULFATE (2.5 MG/3ML) 0.083% IN NEBU: 2.5000 mg | INHALATION_SOLUTION | Freq: Four times a day (QID) | RESPIRATORY_TRACT | Status: DC | PRN

## 2017-08-24 MED ORDER — ATORVASTATIN CALCIUM 40 MG PO TABS: 40.0000 mg | ORAL_TABLET | Freq: Every day | ORAL | Status: DC

## 2017-08-24 MED ORDER — MOMETASONE FURO-FORMOTEROL FUM 200-5 MCG/ACT IN AERO
2.0000 | INHALATION_SPRAY | Freq: Two times a day (BID) | RESPIRATORY_TRACT | Status: DC
Start: 2017-08-24 — End: 2017-08-24
  Administered 2017-08-24: 2 via RESPIRATORY_TRACT
  Filled 2017-08-24 (×2): qty 8.8

## 2017-08-24 MED ORDER — PREDNISONE 10 MG PO TABS
10.0000 mg | ORAL_TABLET | Freq: Two times a day (BID) | ORAL | Status: DC
  Administered 2017-08-24: 10 mg via ORAL
  Filled 2017-08-24: qty 1

## 2017-08-24 MED ORDER — LOSARTAN POTASSIUM 50 MG PO TABS
100.0000 mg | ORAL_TABLET | Freq: Every day | ORAL | Status: DC
  Administered 2017-08-24: 100 mg via ORAL
  Filled 2017-08-24: qty 2

## 2017-08-24 MED ORDER — TIOTROPIUM BROMIDE MONOHYDRATE 18 MCG IN CAPS: 18.0000 ug | ORAL_CAPSULE | Freq: Every day | RESPIRATORY_TRACT | Status: DC

## 2017-08-24 MED ORDER — ACETAMINOPHEN 500 MG PO TABS: 500.0000 mg | ORAL_TABLET | Freq: Three times a day (TID) | ORAL | Status: DC | PRN

## 2017-08-24 MED ORDER — FLUTICASONE PROPIONATE 50 MCG/ACT NA SUSP
1.0000 | Freq: Every day | NASAL | Status: DC
  Filled 2017-08-24: qty 16

## 2017-08-24 MED ORDER — OXYMETAZOLINE HCL 0.05 % NA SOLN
1.0000 | NASAL | Status: DC | PRN
  Filled 2017-08-24: qty 15

## 2017-08-24 MED ORDER — TRAZODONE HCL 50 MG PO TABS: 50.0000 mg | ORAL_TABLET | Freq: Every day | ORAL | Status: DC

## 2017-08-24 MED ORDER — TRAMADOL HCL 50 MG PO TABS
50.0000 mg | ORAL_TABLET | Freq: Three times a day (TID) | ORAL | Status: DC | PRN
Start: 2017-08-24 — End: 2017-08-24

## 2017-08-24 MED ORDER — IPRATROPIUM BROMIDE 0.03 % NA SOLN
2.0000 | Freq: Two times a day (BID) | NASAL | Status: DC
  Filled 2017-08-24: qty 30

## 2017-08-24 MED ORDER — ASPIRIN 81 MG PO CHEW
81.0000 mg | CHEWABLE_TABLET | Freq: Every day | ORAL | Status: DC
  Administered 2017-08-24: 81 mg via ORAL
  Filled 2017-08-24: qty 1

## 2017-08-24 MED ORDER — PREDNISONE 20 MG PO TABS: 20.0000 mg | ORAL_TABLET | Freq: Every day | ORAL | Status: DC

## 2017-08-24 MED ORDER — GUAIFENESIN 100 MG/5ML PO SOLN
10.0000 mL | Freq: Two times a day (BID) | ORAL | Status: DC
  Administered 2017-08-24 (×2): 200 mg via ORAL
  Filled 2017-08-24: qty 25
  Filled 2017-08-24: qty 10

## 2017-08-24 MED FILL — Lidocaine HCl Local Preservative Free (PF) Inj 1%: INTRAMUSCULAR | Qty: 30 | Status: AC

## 2017-08-24 NOTE — Discharge Summary (Signed)
Physician Discharge Summary  Jose Flores IWO:[id number] DOB: Jun 26, 1950 DOA: 08/23/2017  PCP: [facility]  Admit date: 08/23/2017 Discharge date: 08/24/2017  Admitted From:  Discharge disposition: home   Recommendations for Outpatient Follow-Up:   1. Outpatient ENT  2. Resume high flow O2    Discharge Diagnosis:   Principal Problem:   Epistaxis Active Problems:   Hypoxia   Interstitial lung disease ([place])   Essential hypertension    Discharge Condition: Improved.  Diet recommendation: Low sodium, heart healthy  Wound care: None.  Code status: Full.   History of Present Illness:   Jose Flores is a 67 y.o. male with medical history significant of interstitial lung disease and chronic oxygen by nasal cannula at home usually on 6 L. Patient was discharged this morning after admission where he was found to have multivessel coronary artery disease and had a cardiac stent to meet circumflex done yesterday. Patient is on aspirin and Plavix. He was using his oxygen at home when his daughter having epistaxis. Came to the ER with significant bleeding. Patient had his right nostril packed but is unable to keep his oxygen saturation off once he has a nasal cannula in place. All syndromes to 60%. At the moment he has the nasal cannula in his mouth maintaining his oxygen saturation in the 90s. Patient has to continue on Plavix and aspirin because of recent stent. He is being admitted for observation      Hospital Course by Problem:   Epistaxis:  - Patient has to continue Plavix with the new stent placed. -no nasal packing when  Saw patient, no bleeding either -resumed humidified O2 and PRN afrin -will need ENT follow up (has seen someone in [place] in the past)   interstitial lung disease:  Continue 6-10L O2 (prior)  coronary artery disease: -s/p cardiac stent to Mid Cx lesion  pulmonary hypertension: Secondary to interstitial lung disease.  Continue treatment      Medical Consultants:      Discharge Exam:   Vitals:    08/24/17 1657  BP:    Pulse:  94  Resp:  20  Temp:    SpO2:  95%   Vitals:   08/24/17 1000 08/24/17 1612  08/24/17 1657  BP: 121/74     Pulse: 100   94  Resp: 20   20  Temp:      TempSrc:      SpO2: 95% 99%  95%    General exam: Appears calm and comfortable. No further bleeding, tolerating O2 through nasal canula - anxious to be d/c'd home   The results of significant diagnostics from this hospitalization (including imaging, microbiology, ancillary and laboratory) are listed below for reference.     Procedures and Diagnostic Studies:   Dg Chest Port 1 View  Result Date: 08/23/2017 CLINICAL DATA:  Epistaxis EXAM: PORTABLE CHEST 1 VIEW COMPARISON:  08/17/2017, CT chest 08/18/2017, 05/16/2017 radiograph FINDINGS: Bilateral fibrosis, prominent in the lung apices but without significant change. Cardiomegaly. No acute opacity or pleural effusion. No pneumothorax. IMPRESSION: Similar appearance of bilateral pulmonary fibrosis without acute opacity. Cardiomegaly Electronically Signed   By: Donavan Foil M.D.   On: 08/23/2017 20:42     Labs:   Basic Metabolic Panel: Recent Labs  Lab 08/21/17 0303 08/22/17 0415 08/23/17 0217 08/23/17 1944 08/24/17 0430  NA 138 140 137 137 135  K 4.5 4.1 4.1 5.7* 4.5  CL 100* 104 100* 99* 99*  CO2  30 30 29 26 24   GLUCOSE 146* 101* 116* 149* 131*  BUN 26* 17 20 25* 24*  CREATININE 1.01 1.07 1.45* 1.61* 1.28*  CALCIUM 9.0 8.4* 8.5* 9.4 8.6*   GFR Estimated Creatinine Clearance: 54.9 mL/min (A) (by C-G formula based on SCr of 1.28 mg/dL (H)). Liver Function Tests: Recent Labs  Lab 08/17/17 2308 08/23/17 1944  AST 21 32  ALT 14* 35  ALKPHOS 65 75  BILITOT 0.8 0.8  PROT 6.4* 7.3  ALBUMIN 3.2* 3.7   No results for input(s): LIPASE, AMYLASE in the last 168 hours. No results for input(s): AMMONIA in the last 168 hours. Coagulation  profile Recent Labs  Lab 08/20/17 1343 08/23/17 1944  INR 1.14 1.07    CBC: Recent Labs  Lab 08/17/17 2308  08/21/17 0303 08/22/17 0415 08/23/17 0217 08/23/17 1944 08/24/17 0430  WBC 13.1*   < > 14.9* 14.7* 18.3* 22.5* 21.4*  NEUTROABS 10.0*  --   --   --   --  20.7*  --   HGB 11.3*   < > 11.4* 12.3* 13.4 15.4 13.4  HCT 36.4*   < > 36.6* 39.4 41.4 49.3 42.6  MCV 89.4   < > 88.6 90.0 88.3 88.0 86.8  PLT 203   < > 183 183 208 306 254   < > = values in this interval not displayed.   Cardiac Enzymes: No results for input(s): CKTOTAL, CKMB, CKMBINDEX, TROPONINI in the last 168 hours. BNP: Invalid input(s): POCBNP CBG: Recent Labs  Lab 08/19/17 0925 08/21/17 0741 08/22/17 0742  GLUCAP 249* 115* 81   D-Dimer No results for input(s): DDIMER in the last 72 hours. Hgb A1c No results for input(s): HGBA1C in the last 72 hours. Lipid Profile No results for input(s): CHOL, HDL, LDLCALC, TRIG, CHOLHDL, LDLDIRECT in the last 72 hours. Thyroid function studies No results for input(s): TSH, T4TOTAL, T3FREE, THYROIDAB in the last 72 hours.  Invalid input(s): FREET3 Anemia work up No results for input(s): VITAMINB12, FOLATE, FERRITIN, TIBC, IRON, RETICCTPCT in the last 72 hours. Microbiology Recent Results (from the past 240 hour(s))  MRSA PCR Screening     Status: None   Collection Time: 08/18/17  4:50 PM  Result Value Ref Range Status   MRSA by PCR NEGATIVE NEGATIVE Final    Comment:        The GeneXpert MRSA Assay (FDA approved for NASAL specimens only), is one component of a comprehensive MRSA colonization surveillance program. It is not intended to diagnose MRSA infection nor to guide or monitor treatment for MRSA infections. Performed at [facility], [place] [address], [place], [place] [address]      Discharge Instructions:   Discharge Instructions    Diet - low sodium heart healthy   Complete by:  As directed    Discharge instructions   Complete by:   As directed    Continue high flow O2 6-10L be sure that it is humidified If using duonebs every 6 hours, can hold spiriva-- use one or the other   Increase activity slowly   Complete by:  As directed      Allergies as of 08/24/2017      Reactions   Gabapentin Other (See Comments)   [facility]   Lisinopril Other (See Comments)   [facility]   Penicillins Other (See Comments)   Further details unknown per pt. Has patient had a PCN reaction causing immediate rash, facial/tongue/throat swelling, SOB or lightheadedness with hypotension:  Unknown Has patient had a PCN reaction causing severe rash involving mucus membranes or skin necrosis: Unknown Has patient had a PCN reaction that required hospitalization: Yes Has patient had a PCN reaction occurring within the last 10 years: Unknown If all of the above answers are "NO", then may proceed with Cephalosporin use.      Medication List    TAKE these medications   acetaminophen 500 MG tablet Commonly known as:  TYLENOL Take 500 mg by mouth 3 (three) times daily as needed for moderate pain (Take along with Tramadol).   albuterol 108 (90 Base) MCG/ACT inhaler Commonly known as:  PROVENTIL HFA;VENTOLIN HFA Inhale 2 puffs into the lungs every 6 (six) hours as needed for wheezing or shortness of breath.   amLODipine 10 MG tablet Commonly known as:  NORVASC Take 10 mg by mouth daily.   aspirin 81 MG chewable tablet Chew 1 tablet (81 mg total) by mouth daily.   atorvastatin 40 MG tablet Commonly known as:  LIPITOR Take 1 tablet (40 mg total) by mouth daily at 6 PM.   augmented betamethasone dipropionate 0.05 % cream Commonly known as:  DIPROLENE-AF Apply 1 application topically 2 (two) times daily. To upper arms   budesonide-formoterol 160-4.5 MCG/ACT inhaler Commonly known as:  SYMBICORT Inhale 2 puffs into the lungs 2 (two) times daily.   clopidogrel 75 MG tablet Commonly known as:  PLAVIX Take 1 tablet (75 mg total) by  mouth daily with breakfast.   clotrimazole 10 MG troche Commonly known as:  MYCELEX Take 10 mg by mouth daily as needed (for thrush).   DULoxetine 30 MG capsule Commonly known as:  CYMBALTA Take 30 mg by mouth daily.   fluticasone 50 MCG/ACT nasal spray Commonly known as:  FLONASE Place 1 spray into both nostrils daily.   guaiFENesin 100 MG/5ML Soln Commonly known as:  ROBITUSSIN Take 10 mLs by mouth 2 (two) times daily.   ipratropium 0.03 % nasal spray Commonly known as:  ATROVENT Place 2 sprays into both nostrils every 12 (twelve) hours.   ipratropium-albuterol 0.5-2.5 (3) MG/3ML Soln Commonly known as:  DUONEB Take 3 mLs by nebulization every 6 (six) hours.   losartan 100 MG tablet Commonly known as:  COZAAR Take 100 mg by mouth daily.   multivitamin with minerals Tabs tablet Take 1 tablet by mouth daily.   nystatin 100000 UNIT/ML suspension Commonly known as:  MYCOSTATIN Use as directed 5 mLs in the mouth or throat See admin instructions. Swish for 5 minutes then swallow four times daily as needed for thrush   omeprazole 20 MG capsule Commonly known as:  PRILOSEC Take 20 mg by mouth daily.   oxymetazoline 0.05 % nasal spray Commonly known as:  AFRIN Place 1 spray into both nostrils as needed (Nosebleeds).   polyvinyl alcohol 1.4 % ophthalmic solution Commonly known as:  LIQUIFILM TEARS Place 1 drop into both eyes every 4 (four) hours as needed for dry eyes.   predniSONE 10 MG tablet Commonly known as:  DELTASONE Take 3 tablets (30 mg total) by mouth daily for 2 days, THEN 2 tablets (20 mg total) daily for 2 days, THEN 1 tablet (10 mg total) daily for 2 days. Start taking on:  08/23/2017   SINUS RINSE KIT NA Place 1 packet into the nose 2 (two) times daily.   tiotropium 18 MCG inhalation capsule Commonly known as:  SPIRIVA Place 18 mcg into inhaler and inhale daily.   traMADol 50 MG tablet Commonly known as:  ULTRAM Take 50 mg by mouth 3 (three) times  daily as needed for moderate pain (Take along with Acetaminophen).   traZODone 50 MG tablet Commonly known as:  DESYREL Take 50 mg by mouth at bedtime.      Follow-up Information    [facility] Follow up in 1 week(s).   Contact information: [place] [address] [phone]        ENT that you saw in [place] Follow up.            Time coordinating discharge: 35 min  Signed:  Geradine Girt  [facility] 08/24/2017, 5:07 PM

## 2017-08-24 NOTE — Discharge Summary (Signed)
Physician Discharge Summary  Jose Flores HYI:[id number] DOB: 1950/12/06 DOA: 08/17/2017  PCP: [facility]  Admit date: 08/17/2017 Discharge date: 08/24/2017  Admitted From: Home  Disposition:  Home   Recommendations for Outpatient Follow-up:  1. Follow up with Pulmonology in 1-2 weeks 2. Follow up with Cardiology in 2-4 weeks 3. Please obtain BMP/CBC in one week   Home Health: None  Equipment/Devices: Oxygen  Discharge Condition: Fair  CODE STATUS: Partial code Diet recommendation: Cardiac  Brief/Interim Summary: Jose Flores a 67 y.o.Mwith interstitial lung disease on 6-10L O2 by HFNC, COPD, chronic right heart failure and HTN who presents with several weeks progressive ankle swelling, worsening dyspnea on exertion, incraesed O2 needs, dry cough, and orthopnea. Denied fevers, chills, or chest pain. Called EMS the night of admission for worse dyspnea and was found to be saturating 60% on his usual 6 L/min of supplemental oxygen. He was placed on nonrebreather and treated with 125 mg IV Solu-Medrol, 2 g IV magnesium, and DuoNeb prior to arrival in the ED.  In ER, CTA showed no consolidation or PE, BNP was elevated and he was started on Lasix and admitted to hospitalist service.    Acute right sided heart failure Echo showed normal LV, EF 60%, RVSF severely decreased, grade I DD, est PAP >70.  Diuresed very well with IV Lasix, weaned to home O2.  Discharged without Lasix, plans to measure daily weights, use Lasix p.r.n.     Pericardial effusion Coronary artery disease Cardiology were consulted regarding his pericardial effusion, which they felt was not clinically significant.  Records from the [place] were requested but unavailable, and right and left heart cath was recommended to clarify his right heart pressure and reversible causes of heart failure.  He was found to have severe 1V disease of the LCx, treated with DES on 5/13, started on Plavix, aspirin,  statin.  Interstitial lung disease CT shows stable ILD, no new consolidation or PE.     Follows with [facility].  He was treated with Solumedrol, which was tapered.   HTN Blood pressure well controlled on amlodipine, losartan  Normocytic anemia Anemia of chornic disease.  Stable, no signs of bleeding.      Discharge Diagnoses:  Principal Problem:   Acute on chronic respiratory failure with hypoxia (HCC) Active Problems:   COPD (chronic obstructive pulmonary disease) (HCC)   Interstitial lung disease (HCC)   Essential hypertension   Pericardial effusion   Acute on chronic right-sided heart failure [facility])    Discharge Instructions  Discharge Instructions    AMB Referral to Cardiac Rehabilitation - Phase II   Complete by:  As directed    Diagnosis:  Coronary Stents   Amb Referral to Cardiac Rehabilitation   Complete by:  As directed    Diagnosis:  Coronary Stents   Diet - low sodium heart healthy   Complete by:  As directed    Discharge instructions   Complete by:  As directed    From Jose Flores: You were admitted for trouble breathing. We found that this was from right heart failure as well as from a blockage (fatty plaque) in one of the coronary blood vessels (the arteries that feed your heart). This was opened with a stent. You should take baby aspirin and Plavix for at least 1 year. Call your primary care doctor at the [facility] for a follow up appointment and ask them for a referral to a Cardiologist. Also, you should take atorvastatin/Lipitor 40 mg nightly  With regard to your lungs, finish the prednisone taper as prescribed: Prednisone 30 mg (three tabs) once daily for two days then Prednisone 20 mg (two tabs) once daily for two days, then Prednisone 10 mg (1 tab) once daily for two days then stop   Follow up with Jose Flores on the 28th at your previously scheduled appointment.   Increase activity slowly   Complete by:  As directed      Allergies as of  08/23/2017      Reactions   Gabapentin Other (See Comments)   [facility]   Lisinopril Other (See Comments)   [facility]   Penicillins Other (See Comments)   Further details unknown per pt. Has patient had a PCN reaction causing immediate rash, facial/tongue/throat swelling, SOB or lightheadedness with hypotension: Unknown Has patient had a PCN reaction causing severe rash involving mucus membranes or skin necrosis: Unknown Has patient had a PCN reaction that required hospitalization: Yes Has patient had a PCN reaction occurring within the last 10 years: Unknown If all of the above answers are "NO", then may proceed with Cephalosporin use.      Medication List    TAKE these medications   acetaminophen 500 MG tablet Commonly known as:  TYLENOL Take 500 mg by mouth 3 (three) times daily as needed for moderate pain (Take along with Tramadol).   albuterol 108 (90 Base) MCG/ACT inhaler Commonly known as:  PROVENTIL HFA;VENTOLIN HFA Inhale 2 puffs into the lungs every 6 (six) hours as needed for wheezing or shortness of breath.   amLODipine 10 MG tablet Commonly known as:  NORVASC Take 10 mg by mouth daily.   aspirin 81 MG chewable tablet Chew 1 tablet (81 mg total) by mouth daily.   atorvastatin 40 MG tablet Commonly known as:  LIPITOR Take 1 tablet (40 mg total) by mouth daily at 6 PM.   augmented betamethasone dipropionate 0.05 % cream Commonly known as:  DIPROLENE-AF Apply 1 application topically 2 (two) times daily. To upper arms   budesonide-formoterol 160-4.5 MCG/ACT inhaler Commonly known as:  SYMBICORT Inhale 2 puffs into the lungs 2 (two) times daily.   clopidogrel 75 MG tablet Commonly known as:  PLAVIX Take 1 tablet (75 mg total) by mouth daily with breakfast.   clotrimazole 10 MG troche Commonly known as:  MYCELEX Take 10 mg by mouth daily as needed (for thrush).   DULoxetine 30 MG capsule Commonly known as:  CYMBALTA Take 30 mg by mouth daily.    fluticasone 50 MCG/ACT nasal spray Commonly known as:  FLONASE Place 1 spray into both nostrils daily.   guaiFENesin 100 MG/5ML Soln Commonly known as:  ROBITUSSIN Take 10 mLs by mouth 2 (two) times daily.   ipratropium 0.03 % nasal spray Commonly known as:  ATROVENT Place 2 sprays into both nostrils every 12 (twelve) hours.   ipratropium-albuterol 0.5-2.5 (3) MG/3ML Soln Commonly known as:  DUONEB Take 3 mLs by nebulization every 6 (six) hours.   losartan 100 MG tablet Commonly known as:  COZAAR Take 100 mg by mouth daily.   multivitamin with minerals Tabs tablet Take 1 tablet by mouth daily.   nystatin 100000 UNIT/ML suspension Commonly known as:  MYCOSTATIN Use as directed 5 mLs in the mouth or throat See admin instructions. Swish for 5 minutes then swallow four times daily as needed for thrush   omeprazole 20 MG capsule Commonly known as:  PRILOSEC Take 20 mg by mouth daily.   polyvinyl alcohol  1.4 % ophthalmic solution Commonly known as:  LIQUIFILM TEARS Place 1 drop into both eyes every 4 (four) hours as needed for dry eyes.   predniSONE 10 MG tablet Commonly known as:  DELTASONE Take 3 tablets (30 mg total) by mouth daily for 2 days, THEN 2 tablets (20 mg total) daily for 2 days, THEN 1 tablet (10 mg total) daily for 2 days. Start taking on:  08/23/2017 What changed:    medication strength  See the new instructions.   SINUS RINSE KIT NA Place 1 packet into the nose 2 (two) times daily.   tiotropium 18 MCG inhalation capsule Commonly known as:  SPIRIVA Place 18 mcg into inhaler and inhale daily. What changed:  Another medication with the same name was removed. Continue taking this medication, and follow the directions you see here.   traMADol 50 MG tablet Commonly known as:  ULTRAM Take 50 mg by mouth 3 (three) times daily as needed for moderate pain (Take along with Acetaminophen).   traZODone 50 MG tablet Commonly known as:  DESYREL Take 50 mg by  mouth at bedtime.       Allergies  Allergen Reactions  . Gabapentin Other (See Comments)    [facility]  . Lisinopril Other (See Comments)    [facility]  . Penicillins Other (See Comments)    Further details unknown per pt. Has patient had a PCN reaction causing immediate rash, facial/tongue/throat swelling, SOB or lightheadedness with hypotension: Unknown Has patient had a PCN reaction causing severe rash involving mucus membranes or skin necrosis: Unknown Has patient had a PCN reaction that required hospitalization: Yes Has patient had a PCN reaction occurring within the last 10 years: Unknown If all of the above answers are "NO", then may proceed with Cephalosporin use.     Consultations:  Cardiology   Procedures/Studies: Ct Angio Chest Pe W Or Wo Contrast  Result Date: 08/18/2017 CLINICAL DATA:  67 year old male with shortness of breath. Positive D-dimer. Concern for pulmonary embolism. EXAM: CT ANGIOGRAPHY CHEST WITH CONTRAST TECHNIQUE: Multidetector CT imaging of the chest was performed using the standard protocol during bolus administration of intravenous contrast. Multiplanar CT image reconstructions and MIPs were obtained to evaluate the vascular anatomy. CONTRAST:  136m ISOVUE-370 IOPAMIDOL (ISOVUE-370) INJECTION 76% COMPARISON:  Chest radiograph dated 08/17/2017 FINDINGS: Cardiovascular: Mild cardiomegaly. Small pericardial effusion measuring up to 1 cm in thickness. Correlation with echocardiogram recommended. There is mild atherosclerotic calcification of the thoracic aorta. No aneurysmal dilatation. Evaluation of the aorta is limited due to suboptimal opacification and timing of the contrast. There is no CT evidence of pulmonary embolism. Mediastinum/Nodes: Mildly enlarged mediastinal lymph nodes as seen on the prior CT, of indeterminate clinical significance or etiology. These measure 17 mm in short axis in the AP window and 16 mm in the right paratracheal region. A  mildly enlarged lymph node or confluence of lymph node in the subcarinal region appears similar to prior CT and measures approximately 22 mm in short axis. Top-normal right hilar lymph nodes also noted. The esophagus and the thyroid gland are grossly unremarkable. No mediastinal fluid collection. Lungs/Pleura: Diffuse interstitial coarsening and fibrosis as seen previously. There is no consolidative changes. No pleural effusion or pneumothorax. The central airways are patent. Upper Abdomen: No acute abnormality. Musculoskeletal: No chest wall abnormality. No acute or significant osseous findings. Review of the MIP images confirms the above findings. IMPRESSION: 1. No acute intrathoracic pathology. No CT evidence of pulmonary embolism. 2. Findings of  interstitial fibrosis.  No consolidative changes. 3. Mildly enlarged mediastinal and hilar lymph nodes similar to prior CT and of indeterminate clinical significance or etiology, likely reactive. Clinical correlation is recommended. 4. Mild cardiomegaly and small pericardial effusion. The pericardial effusion is new compared to the prior CT. Correlation with clinical exam and echocardiogram recommended. Electronically Signed   By: Anner Crete M.D.   On: 08/18/2017 02:43   Dg Chest Port 1 View  Result Date: 08/23/2017 CLINICAL DATA:  Epistaxis EXAM: PORTABLE CHEST 1 VIEW COMPARISON:  08/17/2017, CT chest 08/18/2017, 05/16/2017 radiograph FINDINGS: Bilateral fibrosis, prominent in the lung apices but without significant change. Cardiomegaly. No acute opacity or pleural effusion. No pneumothorax. IMPRESSION: Similar appearance of bilateral pulmonary fibrosis without acute opacity. Cardiomegaly Electronically Signed   By: Donavan Foil M.D.   On: 08/23/2017 20:42   Dg Chest Port 1 View  Result Date: 08/17/2017 CLINICAL DATA:  66 year old male with shortness of breath. EXAM: PORTABLE CHEST 1 VIEW COMPARISON:  Chest radiograph dated 05/20/2017 FINDINGS: There is  diffuse interstitial coarsening and fibrosis. There is no focal consolidation, pleural effusion, or pneumothorax. Stable cardiac silhouette. No acute osseous pathology. IMPRESSION: 1. No acute cardiopulmonary process. 2. Chronic interstitial fibrosis. Electronically Signed   By: Anner Crete M.D.   On: 08/17/2017 23:55   Echo 08/18/17 Study Conclusions  - Left ventricle: The cavity size was normal. Systolic function was   normal. The estimated ejection fraction was in the range of 60%   to 65%. Wall motion was normal; there were no regional wall   motion abnormalities. Doppler parameters are consistent with   abnormal left ventricular relaxation (grade 1 diastolic   dysfunction). - Ventricular septum: The contour showed systolic flattening. These   changes are consistent with RV pressure overload. - Aortic valve: Valve area (VTI): 2.63 cm^2. Valve area (Vmax):   2.58 cm^2. Valve area (Vmean): 2.4 cm^2. - Right ventricle: The cavity size was moderately to severely   dilated. Wall thickness was normal. Systolic function was   severely reduced. - Right atrium: The atrium was moderately to severely dilated. - Tricuspid valve: There was moderate regurgitation. - Pulmonary arteries: Systolic pressure was moderately to severely   increased. PA peak pressure: 76 mm Hg (S). - Pericardium, extracardiac: moderate to large loculated posterior   pericardial effusion. THe IVC does not collapse, but the flow   across TV is phasic and suggest tamponade not present.   There was no chamber collapse. Respirophasic change in stroke   volume was normal.   Left and right heart cath 5/13  Mid Cx lesion is 95% stenosed.  Prox RCA to Mid RCA lesion is 40% stenosed.  RPDA lesion is 40% stenosed.  Prox Cx lesion is 40% stenosed.  Prox LAD lesion is 60% stenosed.  Ost LAD to Prox LAD lesion is 40% stenosed.  Mid LAD to Dist LAD lesion is 40% stenosed.  A stent was successfully placed.  Post  intervention, there is a 0% residual stenosis.   Findings:  Ao = 139/81 (105) LV = 147/14 RA = 9 RV = 75/15 PA = 74/31 (46) PCW = 13 Fick cardiac output/index = 6.8/3.6 PVR = 4.9 WU Ao sat = 92% PA sat = 69%, 70%  Assessment: 1. 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 Normal LV function EF 60%  Plan/Discussion:  Plan PCI of LCX. Will need lung transplant eval. Not candidate for selective pulmonary artery  vasodilators with WHO Group III PAH.    Subjective: Feels well.  At baseline.  Breathing good, no complaints.  No chest pain, mild dyspnea with exertion, at baseline.    Discharge Exam: Vitals:   08/23/17 0817 08/23/17 0830  BP: 111/72   Pulse: 90 92  Resp: 15 18  Temp: (!) 97.4 F (36.3 C)   SpO2: 100% 99%   Vitals:   08/23/17 0500 08/23/17 0611 08/23/17 0817 08/23/17 0830  BP:  105/72 111/72   Pulse:  85 90 92  Resp:  _0 Temp:  97.6 F (36.4 C) (!) 97.4 F (36.3 C)   TempSrc:  Oral    SpO2:  98% 100% 99%  Weight: 75.4 kg (166 lb 3.6 oz)     Height:        General: Pt is alert, awake, not in acute distress Cardiovascular: RRR, S1/S2 +, no rubs, no gallops Respiratory: CTA bilaterally, no wheezing, no rhonchi Abdominal: Soft, NT, ND, bowel sounds + Extremities: no edema, no cyanosis    The results of significant diagnostics from this hospitalization (including imaging, microbiology, ancillary and laboratory) are listed below for reference.     Microbiology: Recent Results (from the past 240 hour(s))  MRSA PCR Screening     Status: None   Collection Time: 08/18/17  4:50 PM  Result Value Ref Range Status   MRSA by PCR NEGATIVE NEGATIVE Final    Comment:        The GeneXpert MRSA Assay (FDA approved for NASAL specimens only), is one component of a comprehensive MRSA colonization surveillance program. It is not intended to diagnose MRSA infection nor to  guide or monitor treatment for MRSA infections. Performed at [facility], [place] [address] [place], [place] [address]      Labs: BNP (last 3 results) Recent Labs    05/16/17 1255 08/17/17 2304  BNP 150.3* 537.4*   Basic Metabolic Panel: Recent Labs  Lab 08/20/17 0244 08/21/17 0303 08/22/17 0415 08/23/17 0217 08/23/17 1944  NA 137 138 140 137 137  K 4.7 4.5 4.1 4.1 5.7*  CL 98* 100* 104 100* 99*  CO2 _1 GLUCOSE 154* 146* 101* 116* 149*  BUN 29* 26* 17 20 25*  CREATININE 1.22 1.01 1.07 1.45* 1.61*  CALCIUM 9.0 9.0 8.4* 8.5* 9.4   Liver Function Tests: Recent Labs  Lab 08/17/17 2308 08/23/17 1944  AST 21 32  ALT 14* 35  ALKPHOS 65 75  BILITOT 0.8 0.8  PROT 6.4* 7.3  ALBUMIN 3.2* 3.7   No results for input(s): LIPASE, AMYLASE in the last 168 hours. No results for input(s): AMMONIA in the last 168 hours. CBC: Recent Labs  Lab 08/17/17 2308  08/20/17 0244 08/21/17 0303 08/22/17 0415 08/23/17 0217 08/23/17 1944  WBC 13.1*   < > 17.0* 14.9* 14.7* 18.3* 22.5*  NEUTROABS 10.0*  --   --   --   --   --  20.7*  HGB 11.3*   < > 11.4* 11.4* 12.3* 13.4 15.4  HCT 36.4*   < > 36.7* 36.6* 39.4 41.4 49.3  MCV 89.4   < > 89.7 88.6 90.0 88.3 88.0  PLT 203   < > 199 183 183 208 306   < > = values in this interval not displayed.   Cardiac Enzymes: No results for input(s): CKTOTAL, CKMB, CKMBINDEX, TROPONINI in the last 168 hours. BNP: Invalid input(s): POCBNP CBG: Recent Labs  Lab 08/19/17 0925  08/21/17 0741 08/22/17 0742  GLUCAP 249* 115* 81   D-Dimer No results for input(s): DDIMER in the last 72 hours. Hgb A1c No results for input(s): HGBA1C in the last 72 hours. Lipid Profile No results for input(s): CHOL, HDL, LDLCALC, TRIG, CHOLHDL, LDLDIRECT in the last 72 hours. Thyroid function studies No results for input(s): TSH, T4TOTAL, T3FREE, THYROIDAB in the last 72 hours.  Invalid input(s): FREET3 Anemia work up No results for input(s):  VITAMINB12, FOLATE, FERRITIN, TIBC, IRON, RETICCTPCT in the last 72 hours. Urinalysis No results found for: COLORURINE, APPEARANCEUR, [place], [place], [place], [place], [place], [place], PROTEINUR, UROBILINOGEN, NITRITE, LEUKOCYTESUR Sepsis Labs Invalid input(s): PROCALCITONIN,  WBC,  LACTICIDVEN Microbiology Recent Results (from the past 240 hour(s))  MRSA PCR Screening     Status: None   Collection Time: 08/18/17  4:50 PM  Result Value Ref Range Status   MRSA by PCR NEGATIVE NEGATIVE Final    Comment:        The GeneXpert MRSA Assay (FDA approved for NASAL specimens only), is one component of a comprehensive MRSA colonization surveillance program. It is not intended to diagnose MRSA infection nor to guide or monitor treatment for MRSA infections. Performed at [facility], [place] [address] [place], [place] [address]      Time coordinating discharge: 25 minutes       SIGNED:   Edwin Dada, MD  [facility] 08/23/2017, 12:15 PM

## 2017-08-24 NOTE — ED Notes (Signed)
Patient arrived to floor on [facility] @ 6 LPM with a 02 Sat of 84-86%. RT notified and requested mask with humidifier (patient unable to place cannula in nose due to nose bleed/congestion).

## 2017-08-24 NOTE — ED Notes (Signed)
Breakfast tray ordered 

## 2017-08-24 NOTE — ED Notes (Signed)
Pt. Alert and in room, on monitor and call light given

## 2017-08-31 ENCOUNTER — Encounter (HOSPITAL_COMMUNITY): Payer: Self-pay | Admitting: Internal Medicine

## 2017-10-17 ENCOUNTER — Encounter (HOSPITAL_COMMUNITY): Payer: Self-pay | Admitting: Internal Medicine

## 2017-11-18 ENCOUNTER — Encounter (HOSPITAL_COMMUNITY): Payer: Self-pay | Admitting: Emergency Medicine

## 2017-11-18 ENCOUNTER — Inpatient Hospital Stay (HOSPITAL_COMMUNITY)
Admission: EM | Admit: 2017-11-18 | Discharge: 2017-12-11 | DRG: 196 | Disposition: E | Payer: Medicare PPO | Attending: Family Medicine | Admitting: Family Medicine

## 2017-11-18 ENCOUNTER — Inpatient Hospital Stay (HOSPITAL_COMMUNITY): Payer: Medicare PPO

## 2017-11-18 ENCOUNTER — Emergency Department (HOSPITAL_COMMUNITY): Payer: Medicare PPO

## 2017-11-18 DIAGNOSIS — J441 Chronic obstructive pulmonary disease with (acute) exacerbation: Secondary | ICD-10-CM | POA: Diagnosis present

## 2017-11-18 DIAGNOSIS — I110 Hypertensive heart disease with heart failure: Secondary | ICD-10-CM | POA: Diagnosis present

## 2017-11-18 DIAGNOSIS — I50812 Chronic right heart failure: Secondary | ICD-10-CM | POA: Diagnosis present

## 2017-11-18 DIAGNOSIS — I2510 Atherosclerotic heart disease of native coronary artery without angina pectoris: Secondary | ICD-10-CM | POA: Diagnosis present

## 2017-11-18 DIAGNOSIS — R0602 Shortness of breath: Secondary | ICD-10-CM | POA: Diagnosis present

## 2017-11-18 DIAGNOSIS — Z9842 Cataract extraction status, left eye: Secondary | ICD-10-CM

## 2017-11-18 DIAGNOSIS — Z955 Presence of coronary angioplasty implant and graft: Secondary | ICD-10-CM | POA: Diagnosis not present

## 2017-11-18 DIAGNOSIS — R350 Frequency of micturition: Secondary | ICD-10-CM | POA: Diagnosis present

## 2017-11-18 DIAGNOSIS — Z515 Encounter for palliative care: Secondary | ICD-10-CM | POA: Diagnosis present

## 2017-11-18 DIAGNOSIS — Z7902 Long term (current) use of antithrombotics/antiplatelets: Secondary | ICD-10-CM | POA: Diagnosis not present

## 2017-11-18 DIAGNOSIS — J449 Chronic obstructive pulmonary disease, unspecified: Secondary | ICD-10-CM | POA: Diagnosis not present

## 2017-11-18 DIAGNOSIS — Z7951 Long term (current) use of inhaled steroids: Secondary | ICD-10-CM | POA: Diagnosis not present

## 2017-11-18 DIAGNOSIS — Z9981 Dependence on supplemental oxygen: Secondary | ICD-10-CM

## 2017-11-18 DIAGNOSIS — T380X5A Adverse effect of glucocorticoids and synthetic analogues, initial encounter: Secondary | ICD-10-CM | POA: Diagnosis present

## 2017-11-18 DIAGNOSIS — Z888 Allergy status to other drugs, medicaments and biological substances status: Secondary | ICD-10-CM | POA: Diagnosis not present

## 2017-11-18 DIAGNOSIS — K219 Gastro-esophageal reflux disease without esophagitis: Secondary | ICD-10-CM | POA: Diagnosis present

## 2017-11-18 DIAGNOSIS — J302 Other seasonal allergic rhinitis: Secondary | ICD-10-CM | POA: Diagnosis present

## 2017-11-18 DIAGNOSIS — Z8249 Family history of ischemic heart disease and other diseases of the circulatory system: Secondary | ICD-10-CM | POA: Diagnosis not present

## 2017-11-18 DIAGNOSIS — Z79899 Other long term (current) drug therapy: Secondary | ICD-10-CM

## 2017-11-18 DIAGNOSIS — G479 Sleep disorder, unspecified: Secondary | ICD-10-CM | POA: Diagnosis present

## 2017-11-18 DIAGNOSIS — R040 Epistaxis: Secondary | ICD-10-CM | POA: Diagnosis present

## 2017-11-18 DIAGNOSIS — Z880 Allergy status to penicillin: Secondary | ICD-10-CM | POA: Diagnosis not present

## 2017-11-18 DIAGNOSIS — Z66 Do not resuscitate: Secondary | ICD-10-CM | POA: Diagnosis present

## 2017-11-18 DIAGNOSIS — J84112 Idiopathic pulmonary fibrosis: Secondary | ICD-10-CM | POA: Diagnosis present

## 2017-11-18 DIAGNOSIS — Z7982 Long term (current) use of aspirin: Secondary | ICD-10-CM | POA: Diagnosis not present

## 2017-11-18 DIAGNOSIS — J9621 Acute and chronic respiratory failure with hypoxia: Secondary | ICD-10-CM | POA: Diagnosis present

## 2017-11-18 DIAGNOSIS — I340 Nonrheumatic mitral (valve) insufficiency: Secondary | ICD-10-CM | POA: Diagnosis not present

## 2017-11-18 DIAGNOSIS — Z7189 Other specified counseling: Secondary | ICD-10-CM | POA: Diagnosis not present

## 2017-11-18 DIAGNOSIS — Z87891 Personal history of nicotine dependence: Secondary | ICD-10-CM

## 2017-11-18 LAB — CBC WITH DIFFERENTIAL/PLATELET
Abs Immature Granulocytes: 0.1 10*3/uL (ref 0.0–0.1)
Basophils Absolute: 0.1 10*3/uL (ref 0.0–0.1)
Basophils Relative: 1 %
Eosinophils Absolute: 0.3 10*3/uL (ref 0.0–0.7)
Eosinophils Relative: 3 %
HCT: 43.4 % (ref 39.0–52.0)
Hemoglobin: 12.8 g/dL — ABNORMAL LOW (ref 13.0–17.0)
Immature Granulocytes: 0 %
Lymphocytes Relative: 12 %
Lymphs Abs: 1.4 10*3/uL (ref 0.7–4.0)
MCH: 24.9 pg — ABNORMAL LOW (ref 26.0–34.0)
MCHC: 29.5 g/dL — ABNORMAL LOW (ref 30.0–36.0)
MCV: 84.3 fL (ref 78.0–100.0)
Monocytes Absolute: 0.9 10*3/uL (ref 0.1–1.0)
Monocytes Relative: 8 %
Neutro Abs: 8.8 10*3/uL — ABNORMAL HIGH (ref 1.7–7.7)
Neutrophils Relative %: 76 %
Platelets: 173 10*3/uL (ref 150–400)
RBC: 5.15 MIL/uL (ref 4.22–5.81)
RDW: 17.6 % — ABNORMAL HIGH (ref 11.5–15.5)
WBC: 11.5 10*3/uL — ABNORMAL HIGH (ref 4.0–10.5)

## 2017-11-18 LAB — BASIC METABOLIC PANEL
Anion gap: 14 (ref 5–15)
BUN: 12 mg/dL (ref 8–23)
CO2: 24 mmol/L (ref 22–32)
Calcium: 9 mg/dL (ref 8.9–10.3)
Chloride: 102 mmol/L (ref 98–111)
Creatinine, Ser: 1.09 mg/dL (ref 0.61–1.24)
GFR calc Af Amer: >60 mL/min (ref >60)
GFR calc non Af Amer: >60 mL/min (ref >60)
Glucose, Bld: 102 mg/dL — ABNORMAL HIGH (ref 70–99)
Potassium: 4.4 mmol/L (ref 3.5–5.1)
Sodium: 140 mmol/L (ref 135–145)

## 2017-11-18 LAB — TROPONIN I: Troponin I: 0.03 ng/mL (ref <0.03)

## 2017-11-18 LAB — BRAIN NATRIURETIC PEPTIDE: B Natriuretic Peptide: 976.3 pg/mL — ABNORMAL HIGH (ref 0.0–100.0)

## 2017-11-18 LAB — MRSA PCR SCREENING: MRSA by PCR: NEGATIVE

## 2017-11-18 MED ORDER — MOMETASONE FURO-FORMOTEROL FUM 200-5 MCG/ACT IN AERO
2.0000 | INHALATION_SPRAY | Freq: Two times a day (BID) | RESPIRATORY_TRACT | Status: DC
  Administered 2017-11-19 – 2017-11-24 (×11): 2 via RESPIRATORY_TRACT
  Filled 2017-11-18: qty 8.8

## 2017-11-18 MED ORDER — FLUTICASONE PROPIONATE 50 MCG/ACT NA SUSP
1.0000 | Freq: Every day | NASAL | Status: DC
  Administered 2017-11-18 – 2017-11-23 (×6): 1 via NASAL
  Filled 2017-11-18: qty 16

## 2017-11-18 MED ORDER — METHYLPREDNISOLONE SODIUM SUCC 125 MG IJ SOLR
125.0000 mg | Freq: Four times a day (QID) | INTRAMUSCULAR | Status: DC
  Administered 2017-11-18 – 2017-11-19 (×3): 125 mg via INTRAVENOUS
  Filled 2017-11-18 (×3): qty 2

## 2017-11-18 MED ORDER — TRAZODONE HCL 50 MG PO TABS
50.0000 mg | ORAL_TABLET | Freq: Every evening | ORAL | Status: DC | PRN
Start: 2017-11-18 — End: 2017-11-24
  Administered 2017-11-18 – 2017-11-23 (×6): 50 mg via ORAL
  Filled 2017-11-18 (×6): qty 1

## 2017-11-18 MED ORDER — DIGOXIN 125 MCG PO TABS
0.1250 mg | ORAL_TABLET | Freq: Every day | ORAL | Status: DC
  Administered 2017-11-18 – 2017-11-24 (×7): 0.125 mg via ORAL
  Filled 2017-11-18 (×7): qty 1

## 2017-11-18 MED ORDER — CLOPIDOGREL BISULFATE 75 MG PO TABS
75.0000 mg | ORAL_TABLET | Freq: Every day | ORAL | Status: DC
  Administered 2017-11-19 – 2017-11-24 (×6): 75 mg via ORAL
  Filled 2017-11-18 (×6): qty 1

## 2017-11-18 MED ORDER — DULOXETINE HCL 30 MG PO CPEP
30.0000 mg | ORAL_CAPSULE | Freq: Every day | ORAL | Status: DC
  Administered 2017-11-18 – 2017-11-24 (×7): 30 mg via ORAL
  Filled 2017-11-18 (×7): qty 1

## 2017-11-18 MED ORDER — ATORVASTATIN CALCIUM 40 MG PO TABS
40.0000 mg | ORAL_TABLET | Freq: Every day | ORAL | Status: DC
  Administered 2017-11-22 – 2017-11-23 (×2): 40 mg via ORAL
  Filled 2017-11-18 (×6): qty 1

## 2017-11-18 MED ORDER — IPRATROPIUM BROMIDE 0.06 % NA SOLN
2.0000 | Freq: Two times a day (BID) | NASAL | Status: DC
  Filled 2017-11-18: qty 15

## 2017-11-18 MED ORDER — IOPAMIDOL (ISOVUE-370) INJECTION 76%
INTRAVENOUS | Status: AC
  Filled 2017-11-18: qty 100

## 2017-11-18 MED ORDER — LOSARTAN POTASSIUM 50 MG PO TABS
100.0000 mg | ORAL_TABLET | Freq: Every day | ORAL | Status: DC
  Administered 2017-11-18 – 2017-11-24 (×7): 100 mg via ORAL
  Filled 2017-11-18 (×7): qty 2

## 2017-11-18 MED ORDER — PANTOPRAZOLE SODIUM 40 MG PO TBEC
40.0000 mg | DELAYED_RELEASE_TABLET | Freq: Every day | ORAL | Status: DC
  Administered 2017-11-18 – 2017-11-24 (×7): 40 mg via ORAL
  Filled 2017-11-18 (×7): qty 1

## 2017-11-18 MED ORDER — ALBUTEROL SULFATE (2.5 MG/3ML) 0.083% IN NEBU
3.0000 mL | INHALATION_SOLUTION | Freq: Four times a day (QID) | RESPIRATORY_TRACT | Status: DC | PRN
Start: 2017-11-18 — End: 2017-11-24

## 2017-11-18 MED ORDER — TIOTROPIUM BROMIDE MONOHYDRATE 18 MCG IN CAPS
18.0000 ug | ORAL_CAPSULE | Freq: Every day | RESPIRATORY_TRACT | Status: DC
  Administered 2017-11-19 – 2017-11-24 (×6): 18 ug via RESPIRATORY_TRACT
  Filled 2017-11-18 (×2): qty 5

## 2017-11-18 MED ORDER — ACETAMINOPHEN 650 MG RE SUPP: 650.0000 mg | Freq: Four times a day (QID) | RECTAL | Status: DC | PRN

## 2017-11-18 MED ORDER — ENOXAPARIN SODIUM 40 MG/0.4ML ~~LOC~~ SOLN
40.0000 mg | SUBCUTANEOUS | Status: DC
  Administered 2017-11-18 – 2017-11-23 (×6): 40 mg via SUBCUTANEOUS
  Filled 2017-11-18 (×6): qty 0.4

## 2017-11-18 MED ORDER — IOPAMIDOL (ISOVUE-370) INJECTION 76%
100.0000 mL | Freq: Once | INTRAVENOUS | Status: AC | PRN
  Administered 2017-11-18: 100 mL via INTRAVENOUS

## 2017-11-18 MED ORDER — IPRATROPIUM-ALBUTEROL 0.5-2.5 (3) MG/3ML IN SOLN: 3.0000 mL | Freq: Four times a day (QID) | RESPIRATORY_TRACT | Status: DC | PRN

## 2017-11-18 MED ORDER — IPRATROPIUM-ALBUTEROL 0.5-2.5 (3) MG/3ML IN SOLN
3.0000 mL | Freq: Once | RESPIRATORY_TRACT | Status: AC
  Administered 2017-11-18: 3 mL via RESPIRATORY_TRACT
  Filled 2017-11-18: qty 3

## 2017-11-18 MED ORDER — ASPIRIN 81 MG PO CHEW
81.0000 mg | CHEWABLE_TABLET | Freq: Every day | ORAL | Status: DC
  Administered 2017-11-18 – 2017-11-24 (×7): 81 mg via ORAL
  Filled 2017-11-18 (×7): qty 1

## 2017-11-18 MED ORDER — POLYETHYLENE GLYCOL 3350 17 G PO PACK: 17.0000 g | PACK | Freq: Every day | ORAL | Status: DC | PRN

## 2017-11-18 MED ORDER — ACETAMINOPHEN 325 MG PO TABS: 650.0000 mg | ORAL_TABLET | Freq: Four times a day (QID) | ORAL | Status: DC | PRN

## 2017-11-18 NOTE — ED Notes (Signed)
Patient transported to CT 

## 2017-11-18 NOTE — ED Provider Notes (Signed)
[facility] EMERGENCY DEPARTMENT Provider Note   CSN: [id number] Arrival date & time: 11/26/2017  1241     History   Chief Complaint Chief Complaint  Patient presents with  . Shortness of Breath    HPI Jose Flores is a 67 y.o. male.  HPI   67 year old male brought in by EMS respiratory distress.  O2 sats were 77% on 10 L nasal cannula on their arrival.  Came up into the high 90s on nonrebreather.  He has a past history of pulmonary fibrosis.  Typically he is on 6 L/min at baseline.  Over the last 2 weeks he has been titrating up to 10 L.  In the past 24 hours breathing has got even worse.  Occasional cough without acute change.  He denies any fevers.  No unusual swelling.  Denies any chest pain.  Aside from pulmonary fibrosis he also has a history of hypertension and CAD.  Past Medical History:  Diagnosis Date  . Acid reflux   . COPD (chronic obstructive pulmonary disease) (HCC)   . Coronary artery disease   . Hypertension   . Insomnia   . Pulmonary fibrosis [facility])     Patient Active Problem List   Diagnosis Date Noted  . Epistaxis 08/23/2017  . 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 Pericardial effusion 08/18/2017  . Acute on chronic respiratory failure with hypoxia (HCC)   . Essential hypertension   . Malnutrition of moderate degree 05/20/2017  . SOB (shortness of breath)   . COPD (chronic obstructive pulmonary disease) (HCC)   . Interstitial lung disease (HCC)   . Hypoxia 05/16/2017  . Hypoxic episode 05/16/2017    Past Surgical History:  Procedure Laterality Date  . CATARACT EXTRACTION Left   . CORONARY STENT INTERVENTION N/A 08/22/2017   Procedure: CORONARY STENT INTERVENTION;  Surgeon: Runell Gess, MD;  Location: [facility] INVASIVE CV LAB;  Service: Cardiovascular;  Laterality: N/A;  . HERNIA REPAIR    . RIGHT/LEFT HEART CATH AND CORONARY ANGIOGRAPHY N/A 08/22/2017   Procedure: RIGHT/LEFT HEART CATH AND CORONARY ANGIOGRAPHY;   Surgeon: Dolores Patty, MD;  Location: [facility] INVASIVE CV LAB;  Service: Cardiovascular;  Laterality: N/A;        Home Medications    Prior to Admission medications   Medication Sig Start Date End Date Taking? Authorizing Provider  albuterol (PROVENTIL HFA;VENTOLIN HFA) 108 (90 Base) MCG/ACT inhaler Inhale 2 puffs into the lungs every 6 (six) hours as needed for wheezing or shortness of breath.   Yes [provider]  aspirin 81 MG chewable tablet Chew 1 tablet (81 mg total) by mouth daily. 08/23/17  Yes Danford, Earl Lites, MD  budesonide-formoterol (SYMBICORT) 160-4.5 MCG/ACT inhaler Inhale 2 puffs into the lungs 2 (two) times daily.   Yes [provider]  clopidogrel (PLAVIX) 75 MG tablet Take 1 tablet (75 mg total) by mouth daily with breakfast. 08/23/17  Yes Danford, Earl Lites, MD  clotrimazole (MYCELEX) 10 MG troche Take 10 mg by mouth daily as needed (for thrush).   Yes [provider]  digoxin (LANOXIN) 0.125 MG tablet Take 0.125 mg by mouth daily.   Yes [provider]  DULoxetine (CYMBALTA) 30 MG capsule Take 30 mg by mouth daily.   Yes [provider]  fluticasone (FLONASE) 50 MCG/ACT nasal spray Place 1 spray into both nostrils daily.   Yes [provider]  guaiFENesin (ROBITUSSIN) 100 MG/5ML SOLN Take 10 mLs by mouth every 4 (  four) hours as needed for cough.    Yes [provider]  ipratropium (ATROVENT) 0.03 % nasal spray Place 2 sprays into both nostrils every 12 (twelve) hours.   Yes [provider]  ipratropium-albuterol (DUONEB) 0.5-2.5 (3) MG/3ML SOLN Take 3 mLs by nebulization every 6 (six) hours as needed (wheezing).    Yes [provider]  losartan (COZAAR) 100 MG tablet Take 100 mg by mouth daily.   Yes [provider]  Multiple Vitamin (MULTIVITAMIN WITH MINERALS) TABS tablet Take 1 tablet by mouth daily.   Yes [provider]  omeprazole (PRILOSEC) 20 MG capsule  Take 20 mg by mouth daily.   Yes [provider]  oxymetazoline (AFRIN) 0.05 % nasal spray Place 1 spray into both nostrils as needed (Nosebleeds). 08/24/17  Yes Vann, Jessica U, DO  tiotropium (SPIRIVA) 18 MCG inhalation capsule Place 18 mcg into inhaler and inhale daily.   Yes [provider]  traZODone (DESYREL) 50 MG tablet Take 50 mg by mouth at bedtime.   Yes [provider]  atorvastatin (LIPITOR) 40 MG tablet Take 1 tablet (40 mg total) by mouth daily at 6 PM. Patient not taking: Reported on 11/23/2017 08/23/17   Alberteen Sam, MD    Family History Family History  Problem Relation Age of Onset  . Colon cancer Mother   . Heart failure Father   . Breast cancer Sister     Social History Social History   Tobacco Use  . Smoking status: Former Games developer  . Smokeless tobacco: Never Used  . Tobacco comment: quit smoking in 1993  Substance Use Topics  . Alcohol use: Yes    Comment: occ  . Drug use: No     Allergies   Gabapentin; Lisinopril; and Penicillins   Review of Systems Review of Systems  All systems reviewed and negative, other than as noted in HPI.  Physical Exam Updated Vital Signs BP 125/72   Pulse 99   Temp (!) 97.4 F (36.3 C)   Resp (!) 27   SpO2 98%   Physical Exam  Constitutional: He appears well-developed and well-nourished. No distress.  HENT:  Head: Normocephalic and atraumatic.  Eyes: Conjunctivae are normal. Right eye exhibits no discharge. Left eye exhibits no discharge.  Neck: Neck supple.  Cardiovascular: Normal rate, regular rhythm and normal heart sounds. Exam reveals no gallop and no friction rub.  No murmur heard. Pulmonary/Chest: He is in respiratory distress.  Coarse breath sounds b/l  Abdominal: Soft. He exhibits no distension. There is no tenderness.  Musculoskeletal: He exhibits no edema or tenderness.  Neurological: He is alert.  Skin: Skin is warm and dry.  Psychiatric: He has a normal mood and  affect. His behavior is normal. Thought content normal.  Nursing note and vitals reviewed.    ED Treatments / Results  Labs (all labs ordered are listed, but only abnormal results are displayed) Labs Reviewed  CBC WITH DIFFERENTIAL/PLATELET - Abnormal; Notable for the following components:      Result Value   WBC 11.5 (*)    Hemoglobin 12.8 (*)    MCH 24.9 (*)    MCHC 29.5 (*)    RDW 17.6 (*)    Neutro Abs 8.8 (*)    All other components within normal limits  BASIC METABOLIC PANEL - Abnormal; Notable for the following components:   Glucose, Bld 102 (*)    All other components within normal limits  BRAIN NATRIURETIC PEPTIDE - Abnormal; Notable for the  following components:   B Natriuretic Peptide 976.3 (*)    All other components within normal limits  TROPONIN I - Abnormal; Notable for the following components:   Troponin I 0.03 (*)    All other components within normal limits    EKG EKG Interpretation  Date/Time:  Friday November 18 2017 12:47:58 EDT Ventricular Rate:  96 PR Interval:    QRS Duration: 96 QT Interval:  307 QTC Calculation: 388 R Axis:   143 Text Interpretation:  Sinus tachycardia Ventricular trigeminy Prolonged PR interval Low voltage, precordial leads improvement of previously noted twi precordial and inferior leads Confirmed by Raeford RazorKohut, Mikle Sternberg [phone]([id number]) on 11/27/2017 2:05:00 PM   Radiology Dg Chest Portable 1 View  Result Date: 11/10/2017 CLINICAL DATA:  Short of breath.  Low oxygen saturation at home. EXAM: PORTABLE CHEST 1 VIEW COMPARISON:  08/23/2017 and older studies. FINDINGS: Mild enlargement of the cardiopericardial silhouette. No mediastinal or hilar masses. Irregularly thickened interstitial markings noted bilaterally, without change. No evidence of pneumonia or pulmonary edema. No convincing pleural effusion and no pneumothorax. Skeletal structures are grossly intact. IMPRESSION: 1. No acute cardiopulmonary disease. 2. Mild cardiomegaly and stable  changes of interstitial fibrosis. Electronically Signed   By: Amie Portlandavid  Ormond M.D.   On: 2017/10/20 13:28    Procedures Procedures (including critical care time)  Medications Ordered in ED Medications  ipratropium-albuterol (DUONEB) 0.5-2.5 (3) MG/3ML nebulizer solution 3 mL (3 mLs Nebulization Given 11/23/2017 1312)     Initial Impression / Assessment and Plan / ED Course  I have reviewed the triage vital signs and the nursing notes.  Pertinent labs & imaging results that were available during my care of the patient were reviewed by me and considered in my medical decision making (see chart for details).     67 year old male with hypoxemia.  Acute on chronic respiratory failure.  His troponin is minimally elevated but he has known CAD and this is probably demand ischemia in the setting of significant hypoxemia.  He denies any chest pain.  His EKG actually looks better than his most recent one.  He has poor respiratory status at baseline.  I cannot clearly explain his more acute need for higher oxygen level.  Will obtain a CTA.  Will need admission for ongoing treatment given his degree of hypoxemia.  Final Clinical Impressions(s) / ED Diagnoses   Final diagnoses:  Acute on chronic respiratory failure with hypoxia [facility](HCC)    ED Discharge Orders    None       Raeford RazorKohut, Brenda Cowher, MD 11/20/17 1039

## 2017-11-18 NOTE — ED Notes (Signed)
Pt given water per edp

## 2017-11-18 NOTE — ED Triage Notes (Signed)
Pt here from home with c/o sob sats 77 % on 10 liters n/c pt sats up to 98 % on NRB,

## 2017-11-18 NOTE — H&P (Addendum)
Family Medicine Teaching [facility] Admission History and Physical Service Pager: [phone]  Patient name: Jose Flores Medical record number: [id number] Date of birth: Apr 02, 1951 Age: 67 y.o. Gender: male  Primary Care Provider: [facility], [facility] Consultants: Pulmonary Code Status: DNR  Chief Complaint: SOB  Assessment and Plan: Jose Flores is a 67 y.o. male presenting with SOB for the past several weeks.  His medical history is significant for interstitial lung disease, COPD, CAD s/p stent 08/2017, right heart failure, and hypertension  SOB likely 2/2 ILD and COPD vs. CHF exacerbation Tachypneic to 36, afebrile on arrival, placed on 15L NRB in ED with no documented desat although did have desat to 75% during sitting up on exam. It is unclear from chart review when ILD diagnosis was made and it has been progressing.  Patient reports steady decline in pulmonary function over the past several months with progressively worsening fatigue. Pt follows pulmonologist at the [facility]. At home is currently using 10 L high flow nasal cannula at rest which increases with activity.  Patient reports that he feels that he is holding fluid in his chest. During the time of exam, vitals were only remarkable for RR: 22 sating 96 on 15 L non-rebreather. Physical exam with diffuse crackles, minimal LE edema. ECG without evidence of ischemia. CXR showed no edema with stable changes of interstitial fibrosis. CTA chest showed no evidence of PE.  Trop: neg x1. BNP 976 (previously 940 08/2017 during acute exacerbation, and 150 05/2017). Symptoms most likely consistent with ILD/COPD exacerbation given increased oxygen requirement without pulmonary edema visualized on CXR and only trace LE edema on exam.  -admit to step down, attending Dr. Lum Babe -Pulm consult in AM for medication optimatization -solumedrol 125 mg Q6 -no abx due to lack of fever, no sputum production, no pneumonia visualized on imaging. -no  diuresis, insufficient suspicion for ADHF -monitor vitals, up with assistance  IPF See above for details. Home medication includes: Symbicort BID, Spiriva 2 puffs Daily, Atrovent nasal spray BID, Duonebs TID about 2-3x week when he remembers to take. Albuterol (does not use often). Last saw Pulmonology in March, scheduled for next appointment the end of this month. Per cardiology note on 08/23/2017 pt is not a candidate for pulmonary artery vasodilators. Per chart review, was being considered for lung transplant at [facility] however referral closed due to financial denial with insurance. -continue home meds -consult pulm in am for medication optimatization  Right Heart Failure with CAD Stent place 5/13 in the LCx. ECHO (5/9) PA peak pressure: 76, EF 60-65% w/ grade 1 diastolic dysfunction (pericardial effusion at that time).  Patient does report some chest "heaviness" in the feeling that he does have some fluid on his lungs in addition to mild lower extremity edema. Trop: neg x1, ECG w/o ischemic changes, BNP 976 from 940, no edema on CXR, physical exam obscured due to diffuse crackles 2/2 IPF.  Likely not an acute decompensation of his right heart failure. Home medications include: Plavix, digoxin 0.125 mg, losartan 100 mg, atorvastatin 80 mg (pt stopped taking STATIN several days ago because he suspected it was causing his decreased appetite).  -continue home medications - repeat ECHO  Epistaxis ED visit in 08/2017 for nose bleeds.  Pt does have afrin at home for nose bleeds 2/2 dryness from supplemental O2. No bleeding currently. -monitor for nose bleeds  Polyuria Pt noted increased urinary frequency without dysuria, suprapubic pain or fever. Very dark urine at bedside urinal. -UA  HTN  BP WNL on admission. Home meds: Losartan - continue home med - monitor vitals  GERD No sxs presently. Home meds: Omeprazole - continue home med  Sleep disorder Trazodone every night. -continue home  med  Nerve pain No complaints at this time. Home meds: cymbalta - continue cymbalta  Allergies Takes Flonase, claritin D at home. - continue home meds  FEN/GI: heart healthy Prophylaxis: lovenox  Disposition: admit to stepdown, attending Dr. Lum Babe.  History of Present Illness:  Jose Flores is a 67 y.o. male presenting with SOB for the past several weeks.  His medical history is significant for interstitial lung disease, COPD, CAD s/p stent 08/2017, right heart failure, and hypertension.  Patient reports a slow steady decline in his respiratory status for the past several months that has taken a significant turn since his last hospitalization in May.  At home he currently keeps on 10 L high flow nasal cannula and increases that if he needs to move or even turn in bed.  He has noticed a nonproductive cough for the past several weeks that seems to only bother him while he is lying down (feels different from his GERD symptoms for which he takes omeprazole).  He also has a has very mild lower extremity edema.  Typically does use BiPAP at night but recently has been using his high flow nasal cannula because his BiPAP does not supply sufficient oxygen for him to stay saturated.  He is seen by pulmonology at the [facility] for his ILD, he was last seen in March.  Patient recognizes that while he is a candidate for lung transplant is not high on the list and he does not anticipate ever receiving a transplant.  Patient reports he did have some mild chest pain yesterday that felt like a "sharp streak" running from his left middle chest down his side to his stomach.  Pain did not seem to be related to exertion.  He is no longer experiencing this pain but does report a certain chest "heaviness" that he feels is related to holding fluid in his chest.  He is also noted significant decrease in his appetite which he attributes to starting atorvastatin in May.  Patient states he will have half of a banana sandwich or  half a pimento cheese sandwich and find that he has no desire to finish the rest.  He is aware of his lack of appetite and would like to increase his appetite.  He also mentions a significant decreased energy/weakness in the past months.  For 3 to 4 months he has been increasingly dependent on his wheelchair.  Seems to only move himself from his wheelchair to his bed.  He reports that his legs have become incredibly weak.  He finds that his legs are particularly weak he begins to shake after walking even a short distance.  He believes that if he can regain his appetite he will improve his energy and breathing as well.  There is no acute event that brought Mr. Rando into the ED today, his slow decline and worsened fatigue had simply worsened to the point where he decided he needed to be seen.  Review Of Systems: Per HPI with the following additions:   Review of Systems  Constitutional: Negative for fever.  Respiratory: Positive for cough and shortness of breath. Negative for sputum production.   Cardiovascular: Negative for chest pain and orthopnea.  Gastrointestinal: Positive for constipation. Negative for abdominal pain, diarrhea and nausea.  Genitourinary: Positive for frequency. Negative  for dysuria.  Neurological: Negative for tremors.    Patient Active Problem List   Diagnosis Date Noted  . Epistaxis 08/23/2017  . 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 Pericardial effusion 08/18/2017  . Acute on chronic respiratory failure with hypoxia (HCC)   . Essential hypertension   . Malnutrition of moderate degree 05/20/2017  . SOB (shortness of breath)   . COPD (chronic obstructive pulmonary disease) (HCC)   . Interstitial lung disease (HCC)   . Hypoxia 05/16/2017  . Hypoxic episode 05/16/2017    Past Medical History: Past Medical History:  Diagnosis Date  . Acid reflux   . COPD (chronic obstructive pulmonary disease) (HCC)   . Coronary artery disease   . Hypertension   .  Insomnia   . Pulmonary fibrosis (HCC)     Past Surgical History: Past Surgical History:  Procedure Laterality Date  . CATARACT EXTRACTION Left   . CORONARY STENT INTERVENTION N/A 08/22/2017   Procedure: CORONARY STENT INTERVENTION;  Surgeon: Runell Gess, MD;  Location: [facility] INVASIVE CV LAB;  Service: Cardiovascular;  Laterality: N/A;  . HERNIA REPAIR    . RIGHT/LEFT HEART CATH AND CORONARY ANGIOGRAPHY N/A 08/22/2017   Procedure: RIGHT/LEFT HEART CATH AND CORONARY ANGIOGRAPHY;  Surgeon: Dolores Patty, MD;  Location: [facility] INVASIVE CV LAB;  Service: Cardiovascular;  Laterality: N/A;    Social History: Social History   Tobacco Use  . Smoking status: Former Games developer  . Smokeless tobacco: Never Used  . Tobacco comment: quit smoking in 1993  Substance Use Topics  . Alcohol use: Yes    Comment: occ  . Drug use: No   Additional social history: lives with wife and dog  Please also refer to relevant sections of EMR.  Family History: Family History  Problem Relation Age of Onset  . Colon cancer Mother   . Heart failure Father   . Breast cancer Sister     Allergies and Medications: Allergies  Allergen Reactions  . Gabapentin Other (See Comments)    [facility]  . Lisinopril Other (See Comments)    [facility]  . Penicillins Other (See Comments)    Further details unknown per pt. Has patient had a PCN reaction causing immediate rash, facial/tongue/throat swelling, SOB or lightheadedness with hypotension: Unknown Has patient had a PCN reaction causing severe rash involving mucus membranes or skin necrosis: Unknown Has patient had a PCN reaction that required hospitalization: Yes Has patient had a PCN reaction occurring within the last 10 years: Unknown If all of the above answers are "NO", then may proceed with Cephalosporin use.    No current facility-administered medications on file prior to encounter.    Current Outpatient Medications on File Prior to Encounter   Medication Sig Dispense Refill  . albuterol (PROVENTIL HFA;VENTOLIN HFA) 108 (90 Base) MCG/ACT inhaler Inhale 2 puffs into the lungs every 6 (six) hours as needed for wheezing or shortness of breath.    Marland Kitchen aspirin 81 MG chewable tablet Chew 1 tablet (81 mg total) by mouth daily. 30 tablet 3  . budesonide-formoterol (SYMBICORT) 160-4.5 MCG/ACT inhaler Inhale 2 puffs into the lungs 2 (two) times daily.    . clopidogrel (PLAVIX) 75 MG tablet Take 1 tablet (75 mg total) by mouth daily with breakfast. 30 tablet 3  . clotrimazole (MYCELEX) 10 MG troche Take 10 mg by mouth daily as needed (for thrush).    Marland Kitchen digoxin (LANOXIN) 0.125 MG tablet Take 0.125 mg by mouth daily.    Marland Kitchen  DULoxetine (CYMBALTA) 30 MG capsule Take 30 mg by mouth daily.    . fluticasone (FLONASE) 50 MCG/ACT nasal spray Place 1 spray into both nostrils daily.    Marland Kitchen. guaiFENesin (ROBITUSSIN) 100 MG/5ML SOLN Take 10 mLs by mouth every 4 (four) hours as needed for cough.     Marland Kitchen. ipratropium (ATROVENT) 0.03 % nasal spray Place 2 sprays into both nostrils every 12 (twelve) hours.    Marland Kitchen. ipratropium-albuterol (DUONEB) 0.5-2.5 (3) MG/3ML SOLN Take 3 mLs by nebulization every 6 (six) hours as needed (wheezing).     Marland Kitchen. losartan (COZAAR) 100 MG tablet Take 100 mg by mouth daily.    . Multiple Vitamin (MULTIVITAMIN WITH MINERALS) TABS tablet Take 1 tablet by mouth daily.    Marland Kitchen. omeprazole (PRILOSEC) 20 MG capsule Take 20 mg by mouth daily.    Marland Kitchen. oxymetazoline (AFRIN) 0.05 % nasal spray Place 1 spray into both nostrils as needed (Nosebleeds). 30 mL 0  . tiotropium (SPIRIVA) 18 MCG inhalation capsule Place 18 mcg into inhaler and inhale daily.    . traZODone (DESYREL) 50 MG tablet Take 50 mg by mouth at bedtime.    Marland Kitchen. atorvastatin (LIPITOR) 40 MG tablet Take 1 tablet (40 mg total) by mouth daily at 6 PM. (Patient not taking: Reported on 11/29/2017) 30 tablet 3    Objective: BP 125/72   Pulse 99   Temp (!) 97.4 F (36.3 C)   Resp (!) 27   SpO2 98%   Exam: Physical Exam  Constitutional: He is oriented to person, place, and time. He appears ill.  Eyes: Pupils are equal, round, and reactive to light.  Cardiovascular: Normal rate, normal heart sounds and intact distal pulses.  Pulmonary/Chest: Tachypnea noted. He is in respiratory distress. He has no wheezes. He has no rhonchi. He has rales.  He was tachypneic with labored breathing on 15 L via nonrebreather.  His pulmonary exam was significant for diffuse crackles, likely due to chronic fibrosis although difficult to distinguish if vascular congestion present.  Abdominal: Soft. Bowel sounds are normal.  Musculoskeletal:       Right lower leg: He exhibits edema.       Left lower leg: He exhibits edema.  Trace edema in lower extremities bilaterally.  Neurological: He is alert and oriented to person, place, and time.  3 out of 5 strength in upper and lower extremities.  Sensation to light touch intact in upper and lower extremities.  Skin: Skin is warm and dry.    Labs and Imaging: CBC BMET  Recent Labs  Lab 01/09/18 1254  WBC 11.5*  HGB 12.8*  HCT 43.4  PLT 173   Recent Labs  Lab 01/09/18 1254  NA 140  K 4.4  CL 102  CO2 24  BUN 12  CREATININE 1.09  GLUCOSE 102*  CALCIUM 9.0     Ct Angio Chest Pe W And/or Wo Contrast  Result Date: 12/04/2017 CLINICAL DATA:  Shortness of breath and hypoxia. EXAM: CT ANGIOGRAPHY CHEST WITH CONTRAST TECHNIQUE: Multidetector CT imaging of the chest was performed using the standard protocol during bolus administration of intravenous contrast. Multiplanar CT image reconstructions and MIPs were obtained to evaluate the vascular anatomy. CONTRAST:  100mL ISOVUE-370 IOPAMIDOL (ISOVUE-370) INJECTION 76% COMPARISON:  Portable chest obtained earlier today and chest CTA dated 08/18/2017 and 05/16/2017. FINDINGS: Cardiovascular: Normally opacified pulmonary arteries with no pulmonary arterial filling defects seen. Enlarged heart. Pericardial effusion with  a maximum thickness of 1.8 cm. Enlarged central pulmonary arteries with a  transverse diameter of the main pulmonary artery of 3.4 cm. Atheromatous calcifications, including the coronary arteries and aorta. Mediastinum/Nodes: Again demonstrated are multiple enlarged mediastinal nodes. The largest is a subcarinal node with a short axis diameter of 2.9 cm on image number 65 series 5, previously 3.0 cm in corresponding diameter on 08/18/2017. Superior right paratracheal node with a short axis diameter of 2.1 cm on image number 34 series 5, previously 1.7 cm. Left lower paratracheal node with a short axis diameter of 1.8 cm on image number 49 series 5, previously 1.7 cm. Right paratracheal node with a short axis diameter of 1.4 cm on image number 45 series 5, previously 1.6 cm. Enlarged right celiac axis node with a short axis diameter of 1.0 cm on image number 141 series 5, not previously included. Also demonstrated is an enlarged portacaval node with a short axis diameter of 1.6 cm on image number 149 series 5, previously not included. Lungs/Pleura: Extensive bilateral bullous changes, honeycombing and ground-glass interstitial opacity without significant change. Interval small right pleural effusion, including fissural fluid. Upper Abdomen: Enlarged upper abdominal lymph nodes, described above. Musculoskeletal: Thoracic and lower cervical spine degenerative changes. Review of the MIP images confirms the above findings. IMPRESSION: 1. No pulmonary emboli seen. 2. Interval small right pleural effusion. 3. Small to moderate-sized pericardial effusion, increased. 4.  Calcific coronary artery and aortic atherosclerosis. 5. Enlarged central pulmonary arteries compatible with pulmonary arterial hypertension. 6. Cardiomegaly. 7. Stable interstitial fibrosis and centrilobular and paraseptal emphysema with possible active interstitial inflammatory changes. 8. No significant change in previously demonstrated mediastinal  adenopathy with interval inclusion of mild upper abdominal adenopathy. Aortic Atherosclerosis (ICD10-I70.0) and Emphysema (ICD10-J43.9). Electronically Signed   By: Beckie Salts M.D.   On: 11/12/2017 18:00   Dg Chest Portable 1 View  Result Date: 12/01/2017 CLINICAL DATA:  Short of breath.  Low oxygen saturation at home. EXAM: PORTABLE CHEST 1 VIEW COMPARISON:  08/23/2017 and older studies. FINDINGS: Mild enlargement of the cardiopericardial silhouette. No mediastinal or hilar masses. Irregularly thickened interstitial markings noted bilaterally, without change. No evidence of pneumonia or pulmonary edema. No convincing pleural effusion and no pneumothorax. Skeletal structures are grossly intact. IMPRESSION: 1. No acute cardiopulmonary disease. 2. Mild cardiomegaly and stable changes of interstitial fibrosis. Electronically Signed   By: Amie Portland M.D.   On: 11/19/2017 13:28   Mirian Mo, MD 11/25/2017, 4:12 PM PGY-1, [facility] Family Medicine FPTS Intern pager: [phone], text pages welcome  FPTS Upper-Level Resident Addendum   I have independently interviewed and examined the patient. I have discussed the above with the original author and agree with their documentation. My edits for correction/addition/clarification are in green. Please see also any attending notes.    Ellwood Dense, DO PGY-2, [facility] 11/26/2017 10:20 PM  FPTS Service pager: [phone] (text pages welcome through [facility])

## 2017-11-18 NOTE — ED Notes (Signed)
Date and time results received: November 17, 2017 @1420  (use smartphrase ".now" to insert current time)  Test: troponin Critical Value: 0.03 Name of Provider Notified: [place]  Orders Received? Or Actions Taken?:

## 2017-11-19 ENCOUNTER — Inpatient Hospital Stay (HOSPITAL_COMMUNITY): Payer: Medicare PPO

## 2017-11-19 DIAGNOSIS — J84112 Idiopathic pulmonary fibrosis: Secondary | ICD-10-CM

## 2017-11-19 DIAGNOSIS — R0602 Shortness of breath: Secondary | ICD-10-CM

## 2017-11-19 DIAGNOSIS — I340 Nonrheumatic mitral (valve) insufficiency: Secondary | ICD-10-CM

## 2017-11-19 LAB — STREP PNEUMONIAE URINARY ANTIGEN: Strep Pneumo Urinary Antigen: NEGATIVE

## 2017-11-19 LAB — ECHOCARDIOGRAM COMPLETE
Height: 68 in
Weight: 2555.2 oz

## 2017-11-19 LAB — URINALYSIS, ROUTINE W REFLEX MICROSCOPIC
Bilirubin Urine: NEGATIVE
GLUCOSE, UA: NEGATIVE mg/dL
Hgb urine dipstick: NEGATIVE
Ketones, ur: 5 mg/dL — AB
Leukocytes, UA: NEGATIVE
NITRITE: NEGATIVE
PH: 5 (ref 5.0–8.0)
PROTEIN: NEGATIVE mg/dL
Specific Gravity, Urine: 1.041 — ABNORMAL HIGH (ref 1.005–1.030)

## 2017-11-19 LAB — CBC
HCT: 40.4 % (ref 39.0–52.0)
Hemoglobin: 12.2 g/dL — ABNORMAL LOW (ref 13.0–17.0)
MCH: 25.1 pg — ABNORMAL LOW (ref 26.0–34.0)
MCHC: 30.2 g/dL (ref 30.0–36.0)
MCV: 83 fL (ref 78.0–100.0)
Platelets: 155 10*3/uL (ref 150–400)
RBC: 4.87 MIL/uL (ref 4.22–5.81)
RDW: 17.5 % — ABNORMAL HIGH (ref 11.5–15.5)
WBC: 8.3 10*3/uL (ref 4.0–10.5)

## 2017-11-19 LAB — BASIC METABOLIC PANEL
Anion gap: 12 (ref 5–15)
BUN: 11 mg/dL (ref 8–23)
CO2: 27 mmol/L (ref 22–32)
Calcium: 8.8 mg/dL — ABNORMAL LOW (ref 8.9–10.3)
Chloride: 98 mmol/L (ref 98–111)
Creatinine, Ser: 1.06 mg/dL (ref 0.61–1.24)
GFR calc Af Amer: >60 mL/min (ref >60)
GFR calc non Af Amer: >60 mL/min (ref >60)
Glucose, Bld: 152 mg/dL — ABNORMAL HIGH (ref 70–99)
Potassium: 4.7 mmol/L (ref 3.5–5.1)
Sodium: 137 mmol/L (ref 135–145)

## 2017-11-19 LAB — HIV ANTIBODY (ROUTINE TESTING W REFLEX): HIV Screen 4th Generation wRfx: NONREACTIVE

## 2017-11-19 MED ORDER — METHYLPREDNISOLONE SODIUM SUCC 40 MG IJ SOLR
40.0000 mg | Freq: Three times a day (TID) | INTRAMUSCULAR | Status: DC
  Administered 2017-11-19 – 2017-11-20 (×2): 40 mg via INTRAVENOUS
  Filled 2017-11-19 (×2): qty 1

## 2017-11-19 MED ORDER — IPRATROPIUM BROMIDE 0.06 % NA SOLN
2.0000 | Freq: Two times a day (BID) | NASAL | Status: DC
  Administered 2017-11-19 – 2017-11-23 (×6): 2 via NASAL
  Filled 2017-11-19: qty 15

## 2017-11-19 NOTE — Evaluation (Signed)
Physical Therapy Evaluation Patient Details Name: Jose Flores MRN: [id number] DOB: 1950-06-19 Today's Date: 11/19/2017   History of Present Illness  Pt is a 67 y/o male admitted secondary to SOB with increased supplemental O2 requirements. PMH including but not limited to interstitial lung disease, COPD, CAD s/p stent 08/2017, right heart failure, and hypertension.  Clinical Impression  Pt presented supine in bed with HOB elevated, awake and willing to participate in therapy session. Prior to admission, pt reported that he was independent with transfers and was using either an electric scooter or manual w/c for mobility. Pt lives with his wife who is available 24/7 to assist if needed. Pt also requires 10L of supplemental O2 at baseline. He is very aware of his body and when his SPO2 decreases. He is also very aware of energy conservation techniques that work for him. He was able to perform supine<>sit with supervision. However, with that limited activity his SPO2 decreased to the low 70's and took several minutes with pt in supine and focusing on his breathing to return to 88-91%. Pt on 15L of O2 via HFNC throughout. PT will continue to follow acutely to progress mobility as tolerated. Pt would continue to benefit from skilled physical therapy services at this time while admitted and after d/c to address the below listed limitations in order to improve overall safety and independence with functional mobility.     Follow Up Recommendations Home health PT;Supervision/Assistance - 24 hour;Other (comment)(HH aide)    Equipment Recommendations  None recommended by PT    Recommendations for Other Services       Precautions / Restrictions Precautions Precautions: Fall Precaution Comments: monitor SPO2; O2 dependent Restrictions Weight Bearing Restrictions: No      Mobility  Bed Mobility Overal bed mobility: Needs Assistance Bed Mobility: Supine to Sit;Sit to Supine     Supine to sit:  Supervision Sit to supine: Supervision   General bed mobility comments: increased time and effort, supervision for safety; pt limited secondary to fatigue and increased SOB with SPO2 decreasing to low 70's with activity  Transfers                 General transfer comment: deferred secondary to SOB and pt with desat to low 70's with activity  Ambulation/Gait             General Gait Details: pt reports he is unable to ambulate at baseline secondary to SOB, O2 demands and poor cardiopulmonary tolerance  Stairs            Wheelchair Mobility    Modified Rankin (Stroke Patients Only)       Balance Overall balance assessment: Needs assistance Sitting-balance support: Feet supported Sitting balance-Leahy Scale: Good Sitting balance - Comments: pt able to sit EOB x2 minutes - however, required return to supine secondary to desat to low 70's on 15L of O2 via HFNC                                     Pertinent Vitals/Pain Pain Assessment: No/denies pain    Home Living Family/patient expects to be discharged to:: Private residence Living Arrangements: Spouse/significant other Available Help at Discharge: Family;Available 24 hours/day Type of Home: House Home Access: Ramped entrance     Home Layout: One level Home Equipment: Tub bench;Wheelchair - Pharmacist, hospital Comments: did have 9 hours a week of HH aide/RN through the [place]  but last day was 8/9    Prior Function Level of Independence: Needs assistance   Gait / Transfers Assistance Needed: using either manual or electric scooter for mobility, is able to perfrom transfers on his own  ADL's / Homemaking Assistance Needed: washes up in bed or w/c but it doesn't sound like he is able to wash up very well        Hand Dominance        Extremity/Trunk Assessment   Upper Extremity Assessment Upper Extremity Assessment: Overall WFL for tasks assessed;Defer to OT evaluation     Lower Extremity Assessment Lower Extremity Assessment: Overall WFL for tasks assessed       Communication   Communication: No difficulties  Cognition Arousal/Alertness: Awake/alert Behavior During Therapy: WFL for tasks assessed/performed Overall Cognitive Status: Within Functional Limits for tasks assessed                                        General Comments General comments (skin integrity, edema, etc.): pt on 15L of supplemental O2 via HFNC throughout session    Exercises     Assessment/Plan    PT Assessment Patient needs continued PT services  PT Problem List Decreased activity tolerance;Decreased mobility;Cardiopulmonary status limiting activity       PT Treatment Interventions DME instruction;Functional mobility training;Therapeutic activities;Therapeutic exercise;Balance training;Neuromuscular re-education;Patient/family education    PT Goals (Current goals can be found in the Care Plan section)  Acute Rehab PT Goals Patient Stated Goal: return home with wife PT Goal Formulation: With patient Time For Goal Achievement: 12/03/17 Potential to Achieve Goals: Good    Frequency Min 3X/week   Barriers to discharge        Co-evaluation               AM-PAC PT "6 Clicks" Daily Activity  Outcome Measure Difficulty turning over in bed (including adjusting bedclothes, sheets and blankets)?: A Little Difficulty moving from lying on back to sitting on the side of the bed? : A Lot Difficulty sitting down on and standing up from a chair with arms (e.g., wheelchair, bedside commode, etc,.)?: Unable Help needed moving to and from a bed to chair (including a wheelchair)?: A Little Help needed walking in hospital room?: Total Help needed climbing 3-5 steps with a railing? : Total 6 Click Score: 11    End of Session Equipment Utilized During Treatment: Oxygen(15L) Activity Tolerance: Patient limited by fatigue;Other (comment)(pt desat to low 70's  with activity) Patient left: in bed;with call bell/phone within reach Nurse Communication: Mobility status PT Visit Diagnosis: Difficulty in walking, not elsewhere classified (R26.2);Other abnormalities of gait and mobility (R26.89)    Time: [id number] PT Time Calculation (min) (ACUTE ONLY): 25 min   Charges:   PT Evaluation $PT Eval Moderate Complexity: 1 Mod PT Treatments $Therapeutic Activity: 8-22 mins        WartraceJennifer Easter [place], [place], DPT [phone]   Alessandra BevelsJennifer M Felishia Wartman 11/19/2017, 10:32 AM

## 2017-11-19 NOTE — Progress Notes (Signed)
OT Cancellation Note  Patient Details Name: Jose LimaRoger D Gullo MRN: [id number] DOB: 1950-05-13   Cancelled Treatment:    Reason Eval/Treat Not Completed: Patient declined, no reason specified(eating breakfast) OT spoke with patient and will come back after patient has eaten. Pt currently on 15 L NRB and allowing patient time to attempt PO intake.   Mateo FlowJones, Brynn   OTR/L Pager: [phone] Office: [phone] .   Boone MasterJones, Yuleni Burich B 11/19/2017, 8:45 AM

## 2017-11-19 NOTE — Care Management Note (Signed)
Case Management Note CM weekend coverage for [phone] [phone]  Patient Details  Name: Jose Flores MRN: [id number] Date of Birth: 27-Dec-1950  Subjective/Objective:  Pt admitted increasing SOB, hx of ILD, COPD- has home 02 at baseline at 6L however has increase use to 10L over last several weeks. Pt currently on HFNC 15L                Action/Plan: PTA Pt lived at home with wife- referral received regarding home 02 needs- spoke with pt at bedside- per conversation pt reports that [facility]. He feels like he will need updated home 02 orders and notes- CM will need new orders prior to discharge if pt needs new liter flow for discharge along with qualifying notes. These would need to be sent to both [facility] and [facility]. Per pt [facility] P[facility], Pulm., and Cards- (appt with Cards this mo.). He gets his meds via mailorder through the [facility] or uses the [facility] pharmacy. Pt does state that he has [facility] the [facility]. [facility] the [facility]. CM will continue to follow for transition of care needs.   Expected Discharge Date:                  Expected Discharge Plan:     In-House Referral:     Discharge planning Services  CM Consult  Post Acute Care Choice:  Durable Medical Equipment Choice offered to:     DME Arranged:  Oxygen DME Agency:  [facility]  [facility] Arranged:    [facility] Agency:     Status of Service:  In process, will continue to follow  If discussed at Long Length of Stay Meetings, dates discussed:    Discharge Disposition:   Additional Comments:  Darrold SpanWebster, Mikiah Durall Hall, RN 11/19/2017, 4:59 PM

## 2017-11-19 NOTE — Consult Note (Addendum)
PULMONARY / CRITICAL CARE MEDICINE   Name: Jose LimaRoger D Sakamoto MRN: [id number] DOB: [birth year]    ADMISSION DATE:  11/29/2017 CONSULTATION DATE:  11/19/17  REFERRING MD:  Joellyn RuedYashija Flores  CHIEF COMPLAINT:  Worsening breathlessness  HISTORY OF PRESENT ILLNESS:   67 Y/O M with PMX of Pulm fibrosis, HTN, CAD, COPD presenting with difficulty breathing. He is on 6 L O2 at baseline at home. However, he increased it to 10 L recently without any improvement. He denies chest pain. [facility]. He stated he was unqualified for a Lung transplant. He, however, had been compliant with his home med. It is unclear from chart review whenILDdiagnosis was made and it has been progressing. Patient reports steady decline in pulmonary function over the past several months with progressively worsening fatigue.[facility].   Will hold off on abx at this time given lack of fever, no sputum production, no pneumoniavisualized on imaging. I was asked to see the patient because of his worsening breathlessness and increased 02 requirements. The patient denies fever, chills, sputum production. He had to dual up his home 02 in the last few months from 9 to  To 12 lpm. He is housebound other than Dr. Visits. His diagnosis of pulmonary fibrosis was made over 3 years ago. He has been on 02 for at least 3 years time. His CT scan suggest stigmata of Pulmonary fibrosis as well as as bullous emphysema.  PAST MEDICAL HISTORY :  He  has a past medical history of Acid reflux, COPD (chronic obstructive pulmonary disease) (HCC), Coronary artery disease, Hypertension, Insomnia, and Pulmonary fibrosis (HCC).  PAST SURGICAL HISTORY: He  has a past surgical history that includes Hernia repair; Cataract extraction (Left); RIGHT/LEFT HEART CATH AND CORONARY ANGIOGRAPHY (N/A, 08/22/2017); and CORONARY STENT INTERVENTION (N/A, 08/22/2017).  Allergies  Allergen Reactions  . Gabapentin Other  (See Comments)    Per [facility] Records  . Lisinopril Other (See Comments)    Per [facility] Records  . Penicillins Other (See Comments)    Further details unknown per pt. Has patient had a PCN reaction causing immediate rash, facial/tongue/throat swelling, SOB or lightheadedness with hypotension: Unknown Has patient had a PCN reaction causing severe rash involving mucus membranes or skin necrosis: Unknown Has patient had a PCN reaction that required hospitalization: Yes Has patient had a PCN reaction occurring within the last 10 years: Unknown If all of the above answers are "NO", then may proceed with Cephalosporin use.     No current facility-administered medications on file prior to encounter.    Current Outpatient Medications on File Prior to Encounter  Medication Sig  . albuterol (PROVENTIL HFA;VENTOLIN HFA) 108 (90 Base) MCG/ACT inhaler Inhale 2 puffs into the lungs every 6 (six) hours as needed for wheezing or shortness of breath.  Marland Kitchen. aspirin 81 MG chewable tablet Chew 1 tablet (81 mg total) by mouth daily.  . budesonide-formoterol (SYMBICORT) 160-4.5 MCG/ACT inhaler Inhale 2 puffs into the lungs 2 (two) times daily.  . clopidogrel (PLAVIX) 75 MG tablet Take 1 tablet (75 mg total) by mouth daily with breakfast.  . clotrimazole (MYCELEX) 10 MG troche Take 10 mg by mouth daily as needed (for thrush).  Marland Kitchen. digoxin (LANOXIN) 0.125 MG tablet Take 0.125 mg by mouth daily.  . DULoxetine (CYMBALTA) 30 MG capsule Take 30 mg by mouth daily.  . fluticasone (FLONASE) 50 MCG/ACT nasal spray Place 1 spray into both nostrils daily.  Marland Kitchen. guaiFENesin (ROBITUSSIN) 100 MG/5ML SOLN  Take 10 mLs by mouth every 4 (four) hours as needed for cough.   Marland Kitchen ipratropium (ATROVENT) 0.03 % nasal spray Place 2 sprays into both nostrils every 12 (twelve) hours.  Marland Kitchen ipratropium-albuterol (DUONEB) 0.5-2.5 (3) MG/3ML SOLN Take 3 mLs by nebulization every 6 (six) hours as needed (wheezing).   Marland Kitchen losartan (COZAAR) 100 MG tablet Take 100 mg  by mouth daily.  . Multiple Vitamin (MULTIVITAMIN WITH MINERALS) TABS tablet Take 1 tablet by mouth daily.  Marland Kitchen omeprazole (PRILOSEC) 20 MG capsule Take 20 mg by mouth daily.  Marland Kitchen oxymetazoline (AFRIN) 0.05 % nasal spray Place 1 spray into both nostrils as needed (Nosebleeds).  . tiotropium (SPIRIVA) 18 MCG inhalation capsule Place 18 mcg into inhaler and inhale daily.  . traZODone (DESYREL) 50 MG tablet Take 50 mg by mouth at bedtime.  Marland Kitchen atorvastatin (LIPITOR) 40 MG tablet Take 1 tablet (40 mg total) by mouth daily at 6 PM. (Patient not taking: Reported on Dec 05, 2017)    FAMILY HISTORY:  His family history includes Breast cancer in his sister; Colon cancer in his mother; Heart failure in his father.  SOCIAL HISTORY: He  reports that he has quit smoking. He has never used smokeless tobacco. He reports that he drinks alcohol. He reports that he does not use drugs.  REVIEW OF SYSTEMS:   Review of Systems  Constitutional: Positive for malaise/fatigue and weight loss. Negative for chills and fever.  HENT: Negative.   Eyes: Negative.   Respiratory: Positive for cough and shortness of breath. Negative for hemoptysis and sputum production.   Cardiovascular: Negative for chest pain, palpitations, orthopnea and leg swelling.  Gastrointestinal: Negative.   Genitourinary: Negative.   Musculoskeletal: Negative.   Skin: Negative.   Neurological: Negative.   Endo/Heme/Allergies: Negative.        VITAL SIGNS: BP (!) 143/95 (BP Location: Right Arm)   Pulse 70   Temp 97.9 F (36.6 C) (Oral)   Resp (!) 28   Ht 5\' 8"  (1.727 m)   Wt 72.4 kg   SpO2 96%   BMI 24.28 kg/m       VENTILATOR SETTINGS: FiO2 (%):  [70 %-100 %] 100 %  INTAKE / OUTPUT: No intake/output data recorded.  PHYSICAL EXAMINATION: General: This patient looks a littl older than stated age. He is not in significant distress presently Neuro: Alert and Oriented, moves all extr. HEENT: [place]/AT, sclerae anicteric,  PERRLA Cardiovascular:  RRR s1s2 Lungs:  Few scattered crackles. Distant BS Abdomen:  Soft, BS+ , non-tender Extr: s/c/e Skin:  Warm and dry  LABS:  BMET Recent Labs  Lab 12-05-2017 1254 11/19/17 0336  NA 140 137  K 4.4 4.7  CL 102 98  CO2 24 27  BUN 12 11  CREATININE 1.09 1.06  GLUCOSE 102* 152*    Electrolytes Recent Labs  Lab 05-Dec-2017 1254 11/19/17 0336  CALCIUM 9.0 8.8*    CBC Recent Labs  Lab 05-Dec-2017 1254 11/19/17 0336  WBC 11.5* 8.3  HGB 12.8* 12.2*  HCT 43.4 40.4  PLT 173 155    Coag's No results for input(s): APTT, INR in the last 168 hours.  Sepsis Markers No results for input(s): LATICACIDVEN, PROCALCITON, O2SATVEN in the last 168 hours.  ABG No results for input(s): PHART, PCO2ART, PO2ART in the last 168 hours.  Liver Enzymes No results for input(s): AST, ALT, ALKPHOS, BILITOT, ALBUMIN in the last 168 hours.  Cardiac Enzymes Recent Labs  Lab 12-05-17 1254  TROPONINI 0.03*    Glucose No  results for input(s): GLUCAP in the last 168 hours.  Imaging Ct Angio Chest Pe W And/or Wo Contrast  Result Date: 11/20/2017 CLINICAL DATA:  Shortness of breath and hypoxia. EXAM: CT ANGIOGRAPHY CHEST WITH CONTRAST TECHNIQUE: Multidetector CT imaging of the chest was performed using the standard protocol during bolus administration of intravenous contrast. Multiplanar CT image reconstructions and MIPs were obtained to evaluate the vascular anatomy. CONTRAST:  ISOVUE-370 IOPAMIDOL (ISOVUE-370) INJECTION 76% COMPARISON:  Portable chest obtained earlier today and chest CTA dated 08/18/2017 and 05/16/2017. FINDINGS: Cardiovascular: Normally opacified pulmonary arteries with no pulmonary arterial filling defects seen. Enlarged heart. Pericardial effusion with a maximum thickness of 1.8 cm. Enlarged central pulmonary arteries with a transverse diameter of the main pulmonary artery of 3.4 cm. Atheromatous calcifications, including the coronary arteries and  aorta. Mediastinum/Nodes: Again demonstrated are multiple enlarged mediastinal nodes. The largest is a subcarinal node with a short axis diameter of 2.9 cm on image number 65 series 5, previously 3.0 cm in corresponding diameter on 08/18/2017. Superior right paratracheal node with a short axis diameter of 2.1 cm on image number 34 series 5, previously 1.7 cm. Left lower paratracheal node with a short axis diameter of 1.8 cm on image number 49 series 5, previously 1.7 cm. Right paratracheal node with a short axis diameter of 1.4 cm on image number 45 series 5, previously 1.6 cm. Enlarged right celiac axis node with a short axis diameter of 1.0 cm on image number 141 series 5, not previously included. Also demonstrated is an enlarged portacaval node with a short axis diameter of 1.6 cm on image number 149 series 5, previously not included. Lungs/Pleura: Extensive bilateral bullous changes, honeycombing and ground-glass interstitial opacity without significant change. Interval small right pleural effusion, including fissural fluid. Upper Abdomen: Enlarged upper abdominal lymph nodes, described above. Musculoskeletal: Thoracic and lower cervical spine degenerative changes. Review of the MIP images confirms the above findings. IMPRESSION: 1. No pulmonary emboli seen. 2. Interval small right pleural effusion. 3. Small to moderate-sized pericardial effusion, increased. 4.  Calcific coronary artery and aortic atherosclerosis. 5. Enlarged central pulmonary arteries compatible with pulmonary arterial hypertension. 6. Cardiomegaly. 7. Stable interstitial fibrosis and centrilobular and paraseptal emphysema with possible active interstitial inflammatory changes. 8. No significant change in previously demonstrated mediastinal adenopathy with interval inclusion of mild upper abdominal adenopathy. Aortic Atherosclerosis (ICD10-I70.0) and Emphysema (ICD10-J43.9). Electronically Signed   By: Beckie Salts M.D.   On: 12/05/2017 18:00        DISCUSSION:  patient with known longstanding pulmonary fibrosis. His status has apparently been worsening the past several months. He has been requiring more 02 at home and has lost a fair amount of weight as well. He ois basically homebound presently. He denies recent fevers, chills, sputum production. His CXR is grossly unchanged from several months before. The patient  had been on transplant list at one time but apparently is no longer listed. He was counselled against using newer meds fpor IPBF (OFEV) because of side effects   ASSESSMENT / PLAN:  PULMONARY/INFX  The patient may simply have a worseninggof his underlying pulmonary fibrosis. CTA was negative for PE or pneumonia. I see no need for abx at this time. I am getting him a HFNC presently. I believe aa short course of steroids is ok I have requested a resp. Viral panel , sterp and Legionella urine AG test. . CARDIOVASCULAR Recent ECHO confirms decent KLVEF. 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 The patient was found to have CAD 2-3 months ago and underwent single stent placement. However, I do not feel his current difficulties are related to cardiac diseases.  RENAL  Normal renal fn.  In summary, I am not sure I have many suggestions to add in addition to what you are doing. We will follow thepatient along with you however. The patient likely needs a lung transplant But I am  Not sure he will survive a long time waiting for an organ.              Leslie Andrea MD Pulmonary and Critical Care Medicine [facility] Pager: [phone] 11/19/2017, 2:16 PM

## 2017-11-19 NOTE — Progress Notes (Signed)
Pt was tired on the NIV machine tonight. Pt was unable to tolerate it. Pt refuse to wear a full face mask, states that he can not handle a full face mask. So a nasal mask was used per patient request, tired it and patient is breathing through his mouth. Pt was instructed in order to gain the benefits of the therapy you have to breathe through your nose since you are using a nasal mask. With excessive BiPAP alarming due to low minute ventilation and tidal volumes from breathing through his mouth and upon patient request to not wear it due to he states that he has nasal congestion he refuse to wear it tonight. BiPAP is on standby in the room. RN made aware of the event per RRT. Pt is stable at this time. Patient placed back on NRB, SATs are 96% on NRB. no respiratory compromise noted. No further complications noted.

## 2017-11-19 NOTE — Progress Notes (Signed)
Refuses Atorvastatin. States he has talked to his MD re: stopping d/t loss of appetite he associates with this med.

## 2017-11-19 NOTE — Progress Notes (Signed)
  Echocardiogram 2D Echocardiogram has been performed.  Janalyn HarderWest, Deem Marmol R 11/19/2017, 10:52 AM

## 2017-11-19 NOTE — Progress Notes (Addendum)
Family Medicine Teaching Service Daily Progress Note Intern Pager: [phone]  Patient name: Jose Flores Medical record number: [id number] Date of birth: Nov 25, 1950 Age: 67 y.o. Gender: male  Primary Care Provider: [facility] Consultants: Pulmonary  Code Status: DNR   Pt Overview and Major Events to Date:  Jose Flores is a 67 y.o. male presenting with SOB x several weeks.  PMH significant for interstitial lung disease, COPD at home on 10L, CAD s/p stent 08/2017, right heart failure, and hypertension.    SOB likely 2/2 ILD and COPD vs. CHF exacerbation  Improving.  Satting 92% on 13L HFNC this morning without tachypnea or difficulty conversating.  Increased O2 requirement from 10L at home. He denies any acute events overnight and vitals have otherwise remained stable.  Physical exam this morning notable for mild crackles.  It is unclear from chart review when ILD diagnosis was made and it has been progressing.  Patient reports steady decline in pulmonary function over the past several months with progressively worsening fatigue. Pt follows pulmonologist at the [facility].   Will hold off on abx at this time given lack of fever, no sputum production, no pneumonia visualized on imaging. -on HFNC -Pulm consulted, appreciate recs  -IV solumedrol 125 mg Q6 -monitor vitals   IPF See above for details. Home medication includes: Symbicort BID, Spiriva 2 puffs Daily, Atrovent nasal spray BID, Duonebs TID about 2-3x week when he remembers to take. Albuterol (does not use often). Last saw Pulmonology in March, scheduled for next appointment the end of this month. Per cardiology note on 08/23/2017 pt is not a candidate for pulmonary artery vasodilators. Per chart review, was being considered for lung [facility] at [facility] however referral closed due to financial denial with insurance. -continue home meds -f/u pulm recs  Right Heart Failure with CAD Stent place 5/13 in the LCx. ECHO  (5/9) PA peak pressure: 76, EF 60-65% w/ grade 1 diastolic dysfunction (pericardial effusion at that time).  Patient does report some chest "heaviness" in the feeling that he does have some fluid on his lungs in addition to mild lower extremity edema. Trop: neg x1, ECG w/o ischemic changes, BNP 976 from 940, no edema on CXR, physical exam obscured due to diffuse crackles 2/2 IPF.  Likely not an acute decompensation of his right heart failure. Home medications include: Plavix, digoxin 0.125 mg, losartan 100 mg, atorvastatin 80 mg (pt stopped taking STATIN several days ago because he suspected it was causing his decreased appetite).  -continue home medications - 2D ECHO performed, will f/u read  Epistaxis Stable.  ED visit in 08/2017 for nose bleeds.  Pt does have afrin at home for nose bleeds 2/2 dryness from supplemental O2. No bleeding currently. -monitor for nose bleeds  Polyuria Pt noted increased urinary frequency without dysuria, suprapubic pain or fever. Very dark urine at bedside urinal. -UA pending  HTN  BP 135/89 normotensive this morning. WNL on admission. Home meds: Losartan - continue home med - monitor vitals  GERD No sxs presently. Home meds: Omeprazole - continue home med  Sleep disorder Trazodone QHS -continue home med  Nerve pain No complaints at this time. Home meds: cymbalta - continue cymbalta  Allergies Takes Flonase, claritin D at home. - continue home meds  FEN/GI: heart healthy Prophylaxis: lovenox  Disposition: pending clinical improvement   Subjective:  Patient sitting in bed eating breakfast.  He denies any current complaints, is on 13L per HFNC.  No acute events overnight, denies  CP and SOB.   Objective: Temp:  [97.4 F (36.3 C)-98.3 F (36.8 C)] 97.5 F (36.4 C) (08/10 0740) Pulse Rate:  [70-99] 83 (08/10 0740) Resp:  [12-36] 19 (08/10 0740) BP: (95-146)/(67-89) 135/89 (08/10 0740) SpO2:  [85 %-100 %] 90 % (08/10 0908) FiO2 (%):   [70 %-100 %] 100 % (08/10 0907) Weight:  [72.4 kg] 72.4 kg (08/10 0600)   Physical Exam  General: pleasant 67 yo male, NAD  HEENT: NCAT, PERRL Cardiovascular: RRR no MRG, 2+ pedal pulses Pulmonary/Chest: No distress, on 13L via HFNC, no rhonchi or wheeze, diffuse crackles present  Abdominal: Soft. +bs Musculoskeletal: trace edema in lower extremities bilaterally  Neurological: AOx3, no focal deficits, strength 3/5 in upper and lower extremities bilaterally, sensation intact  Skin: Skin is warm and dry.  Psych: normal mood and affect   Laboratory: Recent Labs  Lab 11/23/2017 1254 11/19/17 0336  WBC 11.5* 8.3  HGB 12.8* 12.2*  HCT 43.4 40.4  PLT 173 155   Recent Labs  Lab 11/27/2017 1254 11/19/17 0336  NA 140 137  K 4.4 4.7  CL 102 98  CO2 24 27  BUN 12 11  CREATININE 1.09 1.06  CALCIUM 9.0 8.8*  GLUCOSE 102* 152*    Imaging/Diagnostic Tests: Ct Angio Chest Pe W And/or Wo Contrast  Result Date: 11/14/2017 CLINICAL DATA:  Shortness of breath and hypoxia. EXAM: CT ANGIOGRAPHY CHEST WITH CONTRAST TECHNIQUE: Multidetector CT imaging of the chest was performed using the standard protocol during bolus administration of intravenous contrast. Multiplanar CT image reconstructions and MIPs were obtained to evaluate the vascular anatomy. CONTRAST:  100mL ISOVUE-370 IOPAMIDOL (ISOVUE-370) INJECTION 76% COMPARISON:  Portable chest obtained earlier today and chest CTA dated 08/18/2017 and 05/16/2017. FINDINGS: Cardiovascular: Normally opacified pulmonary arteries with no pulmonary arterial filling defects seen. Enlarged heart. Pericardial effusion with a maximum thickness of 1.8 cm. Enlarged central pulmonary arteries with a transverse diameter of the main pulmonary artery of 3.4 cm. Atheromatous calcifications, including the coronary arteries and aorta. Mediastinum/Nodes: Again demonstrated are multiple enlarged mediastinal nodes. The largest is a subcarinal node with a short axis diameter of  2.9 cm on image number 65 series 5, previously 3.0 cm in corresponding diameter on 08/18/2017. Superior right paratracheal node with a short axis diameter of 2.1 cm on image number 34 series 5, previously 1.7 cm. Left lower paratracheal node with a short axis diameter of 1.8 cm on image number 49 series 5, previously 1.7 cm. Right paratracheal node with a short axis diameter of 1.4 cm on image number 45 series 5, previously 1.6 cm. Enlarged right celiac axis node with a short axis diameter of 1.0 cm on image number 141 series 5, not previously included. Also demonstrated is an enlarged portacaval node with a short axis diameter of 1.6 cm on image number 149 series 5, previously not included. Lungs/Pleura: Extensive bilateral bullous changes, honeycombing and ground-glass interstitial opacity without significant change. Interval small right pleural effusion, including fissural fluid. Upper Abdomen: Enlarged upper abdominal lymph nodes, described above. Musculoskeletal: Thoracic and lower cervical spine degenerative changes. Review of the MIP images confirms the above findings. IMPRESSION: 1. No pulmonary emboli seen. 2. Interval small right pleural effusion. 3. Small to moderate-sized pericardial effusion, increased. 4.  Calcific coronary artery and aortic atherosclerosis. 5. Enlarged central pulmonary arteries compatible with pulmonary arterial hypertension. 6. Cardiomegaly. 7. Stable interstitial fibrosis and centrilobular and paraseptal emphysema with possible active interstitial inflammatory changes. 8. No significant change in previously demonstrated mediastinal adenopathy  with interval inclusion of mild upper abdominal adenopathy. Aortic Atherosclerosis (ICD10-I70.0) and Emphysema (ICD10-J43.9). Electronically Signed   By: Beckie Salts M.D.   On: 11/17/2017 18:00   Dg Chest Portable 1 View  Result Date: 11/15/2017 CLINICAL DATA:  Short of breath.  Low oxygen saturation at home. EXAM: PORTABLE CHEST 1 VIEW  COMPARISON:  08/23/2017 and older studies. FINDINGS: Mild enlargement of the cardiopericardial silhouette. No mediastinal or hilar masses. Irregularly thickened interstitial markings noted bilaterally, without change. No evidence of pneumonia or pulmonary edema. No convincing pleural effusion and no pneumothorax. Skeletal structures are grossly intact. IMPRESSION: 1. No acute cardiopulmonary disease. 2. Mild cardiomegaly and stable changes of interstitial fibrosis. Electronically Signed   By: Amie Portland M.D.   On: 12/09/2017 13:28     Freddrick March, MD 11/19/2017, 9:14 AM PGY-3, [place] [facility] FPTS Intern pager: [phone], text pages welcome

## 2017-11-20 DIAGNOSIS — J9621 Acute and chronic respiratory failure with hypoxia: Secondary | ICD-10-CM

## 2017-11-20 LAB — BASIC METABOLIC PANEL
ANION GAP: 12 (ref 5–15)
BUN: 20 mg/dL (ref 8–23)
CO2: 25 mmol/L (ref 22–32)
Calcium: 8.7 mg/dL — ABNORMAL LOW (ref 8.9–10.3)
Chloride: 99 mmol/L (ref 98–111)
Creatinine, Ser: 1.15 mg/dL (ref 0.61–1.24)
GFR calc Af Amer: >60 mL/min (ref >60)
GFR calc non Af Amer: >60 mL/min (ref >60)
Glucose, Bld: 146 mg/dL — ABNORMAL HIGH (ref 70–99)
POTASSIUM: 4.6 mmol/L (ref 3.5–5.1)
Sodium: 136 mmol/L (ref 135–145)

## 2017-11-20 LAB — CBC
HEMATOCRIT: 40.5 % (ref 39.0–52.0)
Hemoglobin: 12.1 g/dL — ABNORMAL LOW (ref 13.0–17.0)
MCH: 25.1 pg — ABNORMAL LOW (ref 26.0–34.0)
MCHC: 29.9 g/dL — AB (ref 30.0–36.0)
MCV: 83.9 fL (ref 78.0–100.0)
Platelets: 169 10*3/uL (ref 150–400)
RBC: 4.83 MIL/uL (ref 4.22–5.81)
RDW: 17.6 % — AB (ref 11.5–15.5)
WBC: 11.4 10*3/uL — AB (ref 4.0–10.5)

## 2017-11-20 LAB — RESPIRATORY PANEL BY PCR
ADENOVIRUS-RVPPCR: NOT DETECTED
Bordetella pertussis: NOT DETECTED
CHLAMYDOPHILA PNEUMONIAE-RVPPCR: NOT DETECTED
CORONAVIRUS HKU1-RVPPCR: NOT DETECTED
CORONAVIRUS NL63-RVPPCR: NOT DETECTED
Coronavirus 229E: NOT DETECTED
Coronavirus OC43: NOT DETECTED
Influenza A: NOT DETECTED
Influenza B: NOT DETECTED
Metapneumovirus: NOT DETECTED
Mycoplasma pneumoniae: NOT DETECTED
PARAINFLUENZA VIRUS 1-RVPPCR: NOT DETECTED
PARAINFLUENZA VIRUS 3-RVPPCR: NOT DETECTED
Parainfluenza Virus 2: NOT DETECTED
Parainfluenza Virus 4: NOT DETECTED
RHINOVIRUS / ENTEROVIRUS - RVPPCR: NOT DETECTED
Respiratory Syncytial Virus: NOT DETECTED

## 2017-11-20 MED ORDER — FUROSEMIDE 40 MG PO TABS
40.0000 mg | ORAL_TABLET | Freq: Once | ORAL | Status: AC
Start: 2017-11-20 — End: 2017-11-20
  Administered 2017-11-20: 40 mg via ORAL
  Filled 2017-11-20: qty 1

## 2017-11-20 MED ORDER — METHYLPREDNISOLONE SODIUM SUCC 125 MG IJ SOLR
125.0000 mg | Freq: Three times a day (TID) | INTRAMUSCULAR | Status: DC
  Administered 2017-11-20 – 2017-11-21 (×3): 125 mg via INTRAVENOUS
  Filled 2017-11-20 (×3): qty 2

## 2017-11-20 NOTE — Progress Notes (Signed)
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 At his baseline he required 6 to 10 L of oxygen at home.  [facility]  [facility] evaluation [facility] at [facility] and was turned down CT chest was personally reviewed which shows extensive honeycombing and bullous disease.  He quit smoking in his early 3240s, about 20 pack years  He remains on high flow oxygen, 100% at about 30 L.  On exam-no accessory muscle use, bilateral dry crackles halfway up, S1-S2 normal, no S3, no JVD or pedal edema.  Labs reviewed. Impression -no other cause for severe hypoxia is apparent.  He does not seem to be in overt pulmonary edema, no bronchospasm to suggest COPD exacerbation.  He does have severe pulmonary hypertension with RVSP of 72 and echo Will have to treat him as an IPF flare  Recommend -Increase Solu-Medrol to 125 every 6 -Lasix 40 mg x 1  PCCM will follow  Cyril Mourningakesh Alva MD. FCCP. [facility] Pager [phone] If no response call [phone]   11/20/2017

## 2017-11-20 NOTE — Evaluation (Signed)
Occupational Therapy Evaluation Patient Details Name: Jose Flores MRN: [id number] DOB: 1950/05/21 Today's Date: 11/20/2017    History of Present Illness Pt is a 67 y/o male admitted secondary to SOB with increased supplemental O2 requirements. PMH including but not limited to interstitial lung disease, COPD, CAD s/p stent 08/2017, right heart failure, and hypertension.   Clinical Impression   PTA, pt was living with his wife and was performing BADLs and functional transfers with increased time and effort; pt's wife assisting with LB ADLs. Pt currently requiring Min-Mod A for LB ADLs and Min A for stand pivot transfer to [facility]. Pt SpO2 dropping to 70s with activity and required significant amount of time for recover and to return to 90% SpO2 on 25L O2 100% blend. Pt would benefit from further acute OT to facilitate safe dc. Recommend dc to home with HHOT for further OT to optimize safety, independence with ADLs, and return to PLOF.      Follow Up Recommendations  Home health OT;Supervision/Assistance - 24 hour ; HH Aide   Equipment Recommendations  None recommended by OT    Recommendations for Other Services PT consult     Precautions / Restrictions Precautions Precautions: Fall Precaution Comments: monitor SPO2; O2 dependent Restrictions Weight Bearing Restrictions: No      Mobility Bed Mobility Overal bed mobility: Needs Assistance Bed Mobility: Supine to Sit;Sit to Supine     Supine to sit: Supervision Sit to supine: Supervision   General bed mobility comments: Increased time and effort. SpO2 dropping to 86% during bed mobility. Rising back to 90s after sitting at EOB.  Transfers Overall transfer level: Needs assistance Equipment used: None Transfers: Sit to/from [employer] Sit to Stand: Min guard Stand pivot transfers: Min assist       General transfer comment: Min Guard  A for safety during sit<>stand with bilateral knees blocked. Pt requiring  Min A for stand pivot due to posterior lean    Balance Overall balance assessment: Needs assistance Sitting-balance support: Feet supported Sitting balance-Leahy Scale: Good     Standing balance support: No upper extremity supported;During functional activity Standing balance-Leahy Scale: Poor Standing balance comment: Initial standing with just Min Guard A for safety. Noting posterior lean and requiring Min A for balance                           ADL either performed or assessed with clinical judgement   ADL Overall ADL's : Needs assistance/impaired Eating/Feeding: Set up;Sitting   Grooming: Set up;Supervision/safety;Sitting   Upper Body Bathing: Set up;Supervision/ safety;Sitting   Lower Body Bathing: Minimal assistance;Sit to/from stand Lower Body Bathing Details (indicate cue type and reason): Min A for standing balance with posterior lean. Pt also with poor performance of LB ADLs due to drops in SpO2 with activity. Upper Body Dressing : Set up;Supervision/safety;Sitting   Lower Body Dressing: Minimal assistance;Sit to/from stand Lower Body Dressing Details (indicate cue type and reason): Min A for standing balance with posterior lean. Pt also with poor performance of LB ADLs due to drops in SpO2 with activity. Toilet Transfer: Minimal Chartered loss adjuster Details (indicate cue type and reason): Min A for balance during transfer.  Toileting- Architect and Hygiene: Sit to/from stand;Moderate assistance;+2 for safety/equipment Toileting - Clothing Manipulation Details (indicate cue type and reason): Min A for standing balance while bringing pants down over hips. Pt performing toilet hygiene with lateral leaning. RN finishing toilet hygiene after BM to  make sure pt is clean.      Functional mobility during ADLs: Minimal assistance(stand pivot only) General ADL Comments: Pt presenting with decreased strength, balance, and activity  tolerance. SpO2 dropping with activity on 25L O2 100% blend. See general comments.     Vision Baseline Vision/History: No visual deficits;Wears glasses Patient Visual Report: No change from baseline       Perception     Praxis      Pertinent Vitals/Pain Pain Assessment: No/denies pain     Hand Dominance Right   Extremity/Trunk Assessment Upper Extremity Assessment Upper Extremity Assessment: Overall WFL for tasks assessed   Lower Extremity Assessment Lower Extremity Assessment: Generalized weakness   Cervical / Trunk Assessment Cervical / Trunk Assessment: Normal   Communication Communication Communication: No difficulties   Cognition Arousal/Alertness: Awake/alert Behavior During Therapy: WFL for tasks assessed/performed Overall Cognitive Status: Within Functional Limits for tasks assessed                                     General Comments  SpO2 85-90% on 25L while seated at EOB. Standing SpO2 88-93%. Sitting at SpO2 dropping to 76% for brief periods and then rangining from 79%-86%. HR elevating to 112 with activity. SpO2 dropping to 74% back in supine in bed; elevating HOB and requiring significant amount of time to recover to return to 90%    Exercises     Shoulder Instructions      Home Living Family/patient expects to be discharged to:: Private residence Living Arrangements: Spouse/significant other Available Help at Discharge: Family;Available 24 hours/day Type of Home: House Home Access: Ramped entrance     Home Layout: One level     Bathroom Shower/Tub: Chief Strategy Officer: Standard Bathroom Accessibility: Yes How Accessible: Accessible via walker Home Equipment: Tub bench;Wheelchair - [employer];Wheelchair - power;Grab bars - tub/shower;Grab bars - toilet   Additional Comments: did have 9 hours a week of HH aide/RN through the [place] but last day was 8/9      Prior Functioning/Environment Level of  Independence: Needs assistance  Gait / Transfers Assistance Needed: using either manual or electric scooter for mobility, is able to perfrom transfers on his own ADL's / Homemaking Assistance Needed: Performs BADLs with increased time and effort. Wife assisting with LB ADLs and bathing, but recently has been at rehab after a fall            OT Problem List: Decreased strength;Decreased activity tolerance;Impaired balance (sitting and/or standing);Decreased knowledge of use of DME or AE;Decreased knowledge of precautions;Cardiopulmonary status limiting activity      OT Treatment/Interventions: Self-care/ADL training;Therapeutic exercise;Energy conservation;DME and/or AE instruction;Therapeutic activities;Patient/family education    OT Goals(Current goals can be found in the care plan section) Acute Rehab OT Goals Patient Stated Goal: "walk to the door and back" OT Goal Formulation: With patient Time For Goal Achievement: 12/04/17 Potential to Achieve Goals: Good ADL Goals Pt Will Perform Lower Body Dressing: with set-up;with supervision;sit to/from stand;with adaptive equipment Pt Will Transfer to Toilet: with set-up;with supervision;ambulating;bedside commode;stand pivot transfer Pt Will Perform Toileting - Clothing Manipulation and hygiene: with supervision;with set-up;sit to/from stand;sitting/lateral leans Additional ADL Goal #1: Pt will perform room level funcitonal mobility with Min Guard A  OT Frequency: Min 3X/week   Barriers to D/C: Other (comment)  Wife recently returned from SNF rehab after fall       Co-evaluation  AM-PAC PT "6 Clicks" Daily Activity     Outcome Measure Help from another person eating meals?: None Help from another person taking care of personal grooming?: A Little Help from another person toileting, which includes using toliet, bedpan, or urinal?: A Lot Help from another person bathing (including washing, rinsing, drying)?: A  Little Help from another person to put on and taking off regular upper body clothing?: A Little Help from another person to put on and taking off regular lower body clothing?: A Little 6 Click Score: 18   End of Session Equipment Utilized During Treatment: Gait belt;Oxygen(25L and 100% blend) Nurse Communication: Mobility status  Activity Tolerance: Patient tolerated treatment well Patient left: in bed;with call bell/phone within reach;with bed alarm set  OT Visit Diagnosis: Unsteadiness on feet (R26.81);Other abnormalities of gait and mobility (R26.89);Muscle weakness (generalized) (M62.81)                Time: [id number] OT Time Calculation (min): 44 min Charges:  OT General Charges $OT Visit: 1 Visit OT Evaluation $OT Eval Moderate Complexity: 1 Mod OT Treatments $Self Care/Home Management : 23-37 mins  Whitney Bingaman MSOT, OTR/L Acute Rehab Pager: [phone] Office: [phone]  Theodoro GristCharis M Jeannine Pennisi 11/20/2017, 2:45 PM

## 2017-11-20 NOTE — Progress Notes (Signed)
Family Medicine Teaching Service Daily Progress Note Intern Pager: [phone]  Patient name: Jose Flores Medical record number: [id number] Date of birth: 08/09/1950 Age: 67 y.o. Gender: male  Primary Care Provider: [facility] Consultants: Pulmonary  Code Status: DNR   Pt Overview and Major Events to Date:  TYLEN LEVERICH is a 67 y.o. male presenting with SOB x several weeks.  PMH significant for interstitial lung disease, COPD at home on 10L, CAD s/p stent 08/2017, right heart failure, and hypertension.    SOB likely 2/2 ILD and COPD   Worse overnight 8/10.  AM rounds at 25 HF.   Increased O2 requirement from 6L prescribed at home although he had been using 10.  It is unclear from chart review when ILD diagnosis was made (~57yrs?) and it has been progressing. Pt follows pulmonologist at the [facility].   Will hold off on abx at this time given lack of fever, no sputum production, no pneumonia visualized on imaging. -on HFNC -Pulm consulted, appreciate recs  -IV solumedrol 125 mg Q6 (8/11- ) planned through 8/13 then transition to PO to complete 5 days -monitor vitals -will need new O2 certification as likely new baseline and will be leaving on more than prior prescribed 6L  IPF See above for details. Home medication includes: Symbicort BID, Spiriva 2 puffs Daily, Atrovent nasal spray BID, Duonebs TID about 2-3x week when he remembers to take. Albuterol (does not use often). Last saw Pulmonology in March, scheduled for next appointment the end of this month. Per cardiology note on 08/23/2017 pt is not a candidate for pulmonary artery vasodilators. Per chart review, was being considered for lung [facility] at [facility] however referral closed due to financial denial with insurance. -continue home meds -f/u pulm recs  Right Heart Failure with CAD Stent place 5/13 in the LCx. ECHO (8/11) PA peak pressure: 72, EF  50-55% w/ grade 1 diastolic dysfunction.  Trop: neg x1, ECG w/o  ischemic changes, BNP 976 from 940, no edema on CXR, physical exam obscured due to diffuse crackles 2/2 IPF.   Home medications include: Plavix, digoxin 0.125 mg, losartan 100 mg, atorvastatin 80 mg (pt stopped taking STATIN several days ago because he suspected it was causing his decreased appetite).  -continue home medications  Epistaxis Stable.  ED visit in 08/2017 for nose bleeds.  Pt does have afrin at home for nose bleeds 2/2 dryness from supplemental O2. No bleeding currently. -monitor for nose bleeds  Polyuria-resolved Very dark urine at bedside urinal. UA neg, but amber colored.  No AKI on BMP am 8/11  HTN  BP normotensive 8/11. Home meds: Losartan - continue home med - monitor vitals  GERD No sxs presently. Home meds: Omeprazole - continue home med  Sleep disorder Trazodone QHS -continue home med  Nerve pain No complaints at this time. Home meds: cymbalta - continue cymbalta  Allergies Takes Flonase, claritin D at home. - continue home meds  FEN/GI: heart healthy Prophylaxis: lovenox  Disposition: expect d/c 8/12-13, will need new O2 certification as likely leaving at >6L and transition to oral steroids  Subjective:  Feels like a 6-7/10 compared to his subjective baseline, would like another day of hospitalization for treatment if possible.  Has a good understanding of checking O2 sats and titrating flow to low 90s sats.  Objective: Temp:  [97.4 F (36.3 C)-98 F (36.7 C)] 97.4 F (36.3 C) (08/11 0420) Pulse Rate:  [70-98] 78 (08/11 0420) Resp:  [16-28] 18 (08/11 0420)  BP: (122-143)/(79-95) 131/79 (08/11 0420) SpO2:  [84 %-100 %] 92 % (08/11 0420) FiO2 (%):  [100 %] 100 % (08/11 0109) Weight:  [72.4 kg] 72.4 kg (08/10 0600)   Physical Exam General: pleasant, no 25L HF HEENt: no epistaxis, some dried blood on sheet attributed to IV draw Cardiovascular: RRR, no murmurs, cap refill ~2 Pulmonary/Chest: NAD on 25L hf, no rhonchi or wheeze, no  crackles heard but does have diffuse course lung sounds which could be reeferred from [place]  Abdominal: soft, no TTP, no masses noted Musculoskeletal: 1+ pitting edema in low ext Neurological: grossly intact Skin: Skin is warm and dry.  Psych: normal thought process  Laboratory: Recent Labs  Lab 11/27/2017 1254 11/19/17 0336  WBC 11.5* 8.3  HGB 12.8* 12.2*  HCT 43.4 40.4  PLT 173 155   Recent Labs  Lab 11/17/2017 1254 11/19/17 0336  NA 140 137  K 4.4 4.7  CL 102 98  CO2 24 27  BUN 12 11  CREATININE 1.09 1.06  CALCIUM 9.0 8.8*  GLUCOSE 102* 152*    Imaging/Diagnostic Tests: No results found.   Marthenia RollingBland, Shandy Checo, DO 11/20/2017, 5:22 AM PGY-2, [place] [facility] FPTS Intern pager: [phone], text pages welcome

## 2017-11-20 NOTE — Progress Notes (Signed)
SATURATION QUALIFICATIONS: (This note is used to comply with regulatory documentation for home oxygen)  Patient Saturations on Room Air at Rest = N/A%  Patient Saturations on Room Air while Ambulating = N/A%  Patient Saturations on 30 Liters of oxygen while Ambulating = 79%  Please briefly explain why patient needs home oxygen: Pt was on 6L at home prior to admission.  Pt now requires higher oxygen concentration.  Room air saturation not tested at present due to respiratory instability.

## 2017-11-21 ENCOUNTER — Other Ambulatory Visit: Payer: Self-pay

## 2017-11-21 LAB — BASIC METABOLIC PANEL
ANION GAP: 10 (ref 5–15)
BUN: 23 mg/dL (ref 8–23)
CO2: 27 mmol/L (ref 22–32)
Calcium: 8.7 mg/dL — ABNORMAL LOW (ref 8.9–10.3)
Chloride: 99 mmol/L (ref 98–111)
Creatinine, Ser: 1.08 mg/dL (ref 0.61–1.24)
GFR calc Af Amer: >60 mL/min (ref >60)
Glucose, Bld: 157 mg/dL — ABNORMAL HIGH (ref 70–99)
POTASSIUM: 4.2 mmol/L (ref 3.5–5.1)
Sodium: 136 mmol/L (ref 135–145)

## 2017-11-21 LAB — CBC
HEMATOCRIT: 37.5 % — AB (ref 39.0–52.0)
Hemoglobin: 11.4 g/dL — ABNORMAL LOW (ref 13.0–17.0)
MCH: 24.7 pg — AB (ref 26.0–34.0)
MCHC: 30.4 g/dL (ref 30.0–36.0)
MCV: 81.3 fL (ref 78.0–100.0)
PLATELETS: 160 10*3/uL (ref 150–400)
RBC: 4.61 MIL/uL (ref 4.22–5.81)
RDW: 17.6 % — ABNORMAL HIGH (ref 11.5–15.5)
WBC: 12.2 10*3/uL — ABNORMAL HIGH (ref 4.0–10.5)

## 2017-11-21 LAB — TSH: TSH: 1.098 u[IU]/mL (ref 0.350–4.500)

## 2017-11-21 LAB — LEGIONELLA PNEUMOPHILA SEROGP 1 UR AG: L. PNEUMOPHILA SEROGP 1 UR AG: NEGATIVE

## 2017-11-21 MED ORDER — METHYLPREDNISOLONE SODIUM SUCC 125 MG IJ SOLR
125.0000 mg | Freq: Four times a day (QID) | INTRAMUSCULAR | Status: DC
  Administered 2017-11-21 – 2017-11-23 (×8): 125 mg via INTRAVENOUS
  Filled 2017-11-21 (×8): qty 2

## 2017-11-21 NOTE — Assessment & Plan Note (Signed)
PRogressive IPF since dx with flare Did not take anti-fibrotics despite advice (per hx) Not a transplant candidate (prior eval [facility]) Possuible chronic HP due to damp house.  However hx seems more c/w IPF esp given age, male, white and associatd copd and occupation  Currently end stage  Plan Continue IV steroid 11/21/2017 - aim for 3-4 week opral course starting 11/22/17 Check autoimmune and hP panel (sure was [facility] at [facility] but do not have records) Recommend concurrent palliation - hospice eligible - d.w resident  Too advanced for anti-fibrotics or research trial

## 2017-11-21 NOTE — Progress Notes (Signed)
Family Medicine Teaching Service Daily Progress Note Intern Pager: [phone]  Patient name: Jose Flores Medical record number: [id number] Date of birth: 02-Jan-1951 Age: 67 y.o. Gender: male  [facility] Consultants: Pulmonary  Code Status: DNR   Pt Overview and Major Events to Date:  Jose LimaRoger D Shaddix is a 67 y.o. male presenting with SOB x several weeks.  PMH significant for interstitial lung disease, COPD at home on 10L, CAD s/p stent 08/2017, right heart failure, and hypertension.    SOB likely 2/2 ILD w/ COPD   [facility]  Per PT 8/11, pt walking with 30 L HFNC, desaturations to 79% while walking. Pulmonology notes this is likely due to progression of IPF.  Patient was on high flow nasal cannula in the room with respiratory adjustments.  Patient was on 25 L 100% and was decreased to 80% FiO2 during our conversation. Per pulm conversation, pt should not go home until his O2 requirement is back down to 10 L HFNC. -Pulm consulted, appreciate recs  -IV solumedrol 125 mg Q6 (8/11- ) planned through 8/13 then transition to PO to complete 5 days, transition to PO today -monitor vitals -will need new O2 certification as likely new baseline and will be leaving on more than prior prescribed 6L  IPF See above for details. Home medication includes: Symbicort BID, Spiriva 2 puffs Daily, Atrovent nasal spray BID, Duonebs TID about 2-3x week when he remembers to take. Albuterol (does not use often). -continue home meds -f/u pulm recs  Right Heart Failure with CAD Stent place 5/13 in the LCx. ECHO (8/11) PA peak pressure: 72, EF  50-55% w/ grade 1 diastolic dysfunction.  Home medications include: Plavix, digoxin 0.125 mg, losartan 100 mg, atorvastatin 80 mg. Not fluid overloaded on exam. UOP 875 (8/11) after PO lasix 40 mg. Low suspicion for cardiac involvement in present exacerbation. -continue home  medications  Epistaxis Stable.  ED visit in 08/2017 for nose bleeds.  Pt does have afrin at home for nose bleeds 2/2 dryness from supplemental O2. No bleeding currently. -monitor for nose bleeds  HTN - stable BP normotensive 8/12. Home meds: Losartan - continue home med - monitor vitals  Polyuria-resolved Very dark urine at bedside urinal. UA neg, but amber colored.  No AKI on BMP am 8/11  GERD No sxs presently. Home meds: Omeprazole - continue home med  Sleep disorder Trazodone QHS -continue home med  Nerve pain No complaints at this time. Home meds: cymbalta - continue cymbalta  Allergies Takes Flonase, claritin D at home. - continue home meds  FEN/GI: heart healthy Prophylaxis: lovenox  Disposition: Possible DC home following establishment of new HFNC requirement  Subjective:  Jose Flores was seen at bedside this morning.  This is the first time I have seen him since admission on 8/10.  Patient appears significantly improved from admission and is now able to talk in complete sentences without pausing for air without significant fatigue.  Reports feeling improved from admission and improved appetite.  Feels that his energy has increased his been able to get up and walk around.  Feels that his breathing breathing significantly better compared to admission.  Objective: Temp:  [97.4 F (36.3 C)-97.9 F (36.6 C)] 97.9 F (36.6 C) (08/12 0549) Pulse Rate:  [75-105] 75 (08/12 0400) Resp:  [20-25] 20 (08/12 0114) BP: (111-135)/(74-90) 133/90 (08/12 0400) SpO2:  [91 %-100 %] 94 % (08/12 0400) FiO2 (%):  [90 %-100 %] 100 % (  08/12 0534) Weight:  [73.1 kg] 73.1 kg (08/12 0534)   Physical Exam General: Alert and cooperative and appears to be in no acute distress, patient on high flow nasal cannula 30 L 80% FiO2.  Breakfast was bedside and patient noted good appetite and desire to eat. Cardio: Normal A1 and S2, no S3 or S4. RRR. No murmurs or rubs.   Pulm:-On nasal cannula 30  L 80% FiO2.  Normal respiratory effort.  Patient is able to complete full sentences without pausing.  Auscultation was remarkable for diffuse dry crackles. Abdomen: Bowel sounds normal. Abdomen soft and non-tender.  Extremities: No peripheral edema. Warm/ well perfused.  Strong radial pulses. Neuro: Cranial nerves grossly intact   Laboratory: Recent Labs  Lab 11/19/17 0336 11/20/17 0713 11/21/17 0325  WBC 8.3 11.4* 12.2*  HGB 12.2* 12.1* 11.4*  HCT 40.4 40.5 37.5*  PLT 155 169 160   Recent Labs  Lab 11/19/17 0336 11/20/17 0407 11/21/17 0325  NA 137 136 136  K 4.7 4.6 4.2  CL 98 99 99  CO2 27 25 27   BUN 11 20 23   CREATININE 1.06 1.15 1.08  CALCIUM 8.8* 8.7* 8.7*  GLUCOSE 152* 146* 157*    Imaging/Diagnostic Tests: No results found.   Mirian MoFrank, Rj Pedrosa, MD 11/21/2017, 6:47 AM PGY-2, [place] [facility] FPTS Intern pager: [phone], text pages welcome

## 2017-11-21 NOTE — Progress Notes (Signed)
Pt heart rate dropped to 44 twice on telemetry.  Pt asymptomatic.  BP 133/90, oxygen saturations 95% on 100% HF [place].  MD notified via amion.  Will cont to monitor closely.

## 2017-11-21 NOTE — Progress Notes (Signed)
Physical Therapy Treatment Patient Details Name: Jose LimaRoger D Flores MRN: [id number] DOB: 05-14-1950 Today's Date: 11/21/2017    History of Present Illness Pt is a 67 y/o male admitted secondary to SOB with increased supplemental O2 requirements. PMH including but not limited to interstitial lung disease, COPD, CAD s/p stent 08/2017, right heart failure, and hypertension.    PT Comments    Jose Flores doing well - motivated to work with PT this afternoon. On 25L HFNC during session. Limited due to fatigue and desat with activity.  Continues to require a significant amount of time to recover following limited activity - only able to tolerate ~10 seconds of static standing at bedside prior to needing to return to bed. Will continue to benefit from skilled PT to progress safe functional mobility.    Follow Up Recommendations  Home health PT;Supervision/Assistance - 24 hour;Other (comment)     Equipment Recommendations  None recommended by PT    Recommendations for Other Services       Precautions / Restrictions Precautions Precautions: Fall Precaution Comments: monitor SPO2; O2 dependent Restrictions Weight Bearing Restrictions: No    Mobility  Bed Mobility Overal bed mobility: Needs Assistance Bed Mobility: Supine to Sit;Sit to Supine     Supine to sit: Supervision Sit to supine: Supervision   General bed mobility comments: increased time and effort; moving slow; limited due to O2 sats - dropping to mid 80's - recovering after a few minutes of PLB  Transfers Overall transfer level: Needs assistance Equipment used: Rolling walker (2 wheeled) Transfers: Sit to/from [employer] CorporationStand;Stand Pivot Transfers Sit to Stand: Min guard         General transfer comment: Min guard for safety; only able to toelrate ~10 seconds of standing with O2 dropping to ~82% on 25L HFNC  Ambulation/Gait                 Stairs             Wheelchair Mobility    Modified Rankin (Stroke  Patients Only)       Balance Overall balance assessment: Needs assistance Sitting-balance support: Feet supported Sitting balance-Leahy Scale: Good     Standing balance support: No upper extremity supported Standing balance-Leahy Scale: Poor                              Cognition Arousal/Alertness: Awake/alert Behavior During Therapy: WFL for tasks assessed/performed Overall Cognitive Status: Within Functional Limits for tasks assessed                                        Exercises      General Comments General comments (skin integrity, edema, etc.): SpO2 82-97% on 25L HFNC - recovers by holding towel over nose/mouth      Pertinent Vitals/Pain Pain Assessment: No/denies pain    Home Living                      Prior Function            PT Goals (current goals can now be found in the care plan section) Acute Rehab PT Goals Patient Stated Goal: "walk to the door and back" PT Goal Formulation: With patient Time For Goal Achievement: 12/03/17 Potential to Achieve Goals: Good Progress towards PT goals: Progressing toward goals    Frequency  Min 3X/week      PT Plan Current plan remains appropriate    Co-evaluation              AM-PAC PT "6 Clicks" Daily Activity  Outcome Measure  Difficulty turning over in bed (including adjusting bedclothes, sheets and blankets)?: A Little Difficulty moving from lying on back to sitting on the side of the bed? : A Little Difficulty sitting down on and standing up from a chair with arms (e.g., wheelchair, bedside commode, etc,.)?: Unable Help needed moving to and from a bed to chair (including a wheelchair)?: A Little Help needed walking in hospital room?: Total Help needed climbing 3-5 steps with a railing? : Total 6 Click Score: 12    End of Session Equipment Utilized During Treatment: Oxygen(HFNC 25L) Activity Tolerance: Patient limited by fatigue;Other (comment)(O2  desat) Patient left: in bed;with call bell/phone within reach Nurse Communication: Mobility status PT Visit Diagnosis: Difficulty in walking, not elsewhere classified (R26.2);Other abnormalities of gait and mobility (R26.89)     Time: [id number] PT Time Calculation (min) (ACUTE ONLY): 28 min  Charges:  $Therapeutic Activity: 23-37 mins                     Kipp LaurenceStephanie R Aaron, PT, DPT 11/21/17 3:32 PM Pager: [phone]

## 2017-11-21 NOTE — Assessment & Plan Note (Signed)
Unimproved  Plan Continue HFNC - goal pulse o x > 88% if possible

## 2017-11-21 NOTE — Care Management Important Message (Signed)
Important Message  Patient Details  Name: Jose Flores MRN: [id number] Date of Birth: Feb 13, 1951   Medicare Important Message Given:  Yes    Darrion Macaulay P [name] 11/21/2017, 4:03 PM

## 2017-11-21 NOTE — Progress Notes (Signed)
PULMONARY / CRITICAL CARE MEDICINE   Name: Jose Flores MRN: [id number] DOB: April 11, 1951 PCP [facility] LOS 3 as of 11/21/2017     ADMISSION DATE:  11/29/2017 CONSULTATION DATE:  11/19/2017  BRIEF IPF wit copd. Multiple admits. worsenign hypoxemia    SUBJECTIVE/OVERNIGHT/INTERVAL HX 11/21/2017 - hx retake with him and wife -> says copd dx in 2008/ Fibrosis 4-5 years ago. Says he was offered esbriet/ofev back then but due to fear of GI side effects refused. Last 3 years on o2. Snce dec  2018 - on 10L o2. Declined for [facility] at [facility]. Since early 2019- 8 resp failure admits, 3 at [facility], rest at [facility]. Now on 80% fio2 and 30L HFNC with pulse ox in high 80s.   Home env retake - lives in 66 year old home x 33 years. No mold but he has 8 water collection buckets in the 1200 sq fet house to drain moisture. No birds. 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  Curently on 125mg  solumedrol q6h with minimal improvement      VITAL SIGNS: BP 128/78   Pulse 79   Temp 98 F (36.7 C) (Oral)   Resp 19   Ht 5\' 8"  (1.727 m)   Wt 73.1 kg   SpO2 91%   BMI 24.50 kg/m   HEMODYNAMICS:    VENTILATOR SETTINGS: FiO2 (%):  [80 %-100 %] 80 %  INTAKE / OUTPUT: I/O last 3 completed shifts: In: 1080 [P.O.:1080] Out: 1450 [Urine:1450]     EXAM  General Appearance:    Looks decontidioned. Plesaant. Sitting in bed  Head:    Normocephalic, without obvious abnormality, atraumatic  Eyes:    PERRL - yes, conjunctiva/corneas - clear      Ears:    Normal external ear canals, both ears  Nose:   HFNC ++++  Throat:  ETT TUBE - no , OG tube - no  Neck:   Supple,  No enlargement/tenderness/nodules     Lungs:     Mild increased wob and class 4 dyspnea. Pulse ox high  80s, 30LNC, 80% fio2  Chest wall:    No deformity  Heart:    S1 and S2 normal, no murmur, CVP - no.  Pressors - no  Abdomen:     Soft, no masses, no organomegaly  Genitalia:    Not done  Rectal:   not  done  Extremities:   Extremities- intact     Skin:   Intact in exposed areas .     Neurologic:   Sedation - none -> RASS - +1 . Moves all 4s - yes. CAM-ICU - neg . Orientation - x3+       LABS  PULMONARY No results for input(s): PHART, PCO2ART, PO2ART, HCO3, TCO2, O2SAT in the last 168 hours.  Invalid input(s): PCO2, PO2  CBC Recent Labs  Lab 11/19/17 0336 11/20/17 0713 11/21/17 0325  HGB 12.2* 12.1* 11.4*  HCT 40.4 40.5 37.5*  WBC 8.3 11.4* 12.2*  PLT 155 169 160    COAGULATION No results for input(s): INR in the last 168 hours.  CARDIAC   Recent Labs  Lab 12/09/2017 1254  TROPONINI 0.03*   No results for input(s): PROBNP in the last 168 hours.   CHEMISTRY Recent Labs  Lab 11/17/2017 1254 11/19/17 0336 11/20/17 0407 11/21/17 0325  NA 140 137 136 136  K 4.4 4.7 4.6 4.2  CL 102 98 99 99  CO2 24 27 25 27   GLUCOSE  102* 152* 146* 157*  BUN 12 11 20 23   CREATININE 1.09 1.06 1.15 1.08  CALCIUM 9.0 8.8* 8.7* 8.7*   Estimated Creatinine Clearance: 65.1 mL/min (by C-G formula based on SCr of 1.08 mg/dL).   LIVER No results for input(s): AST, ALT, ALKPHOS, BILITOT, PROT, ALBUMIN, INR in the last 168 hours.   INFECTIOUS No results for input(s): LATICACIDVEN, PROCALCITON in the last 168 hours.   ENDOCRINE CBG (last 3)  No results for input(s): GLUCAP in the last 72 hours.       IMAGING x48h  - image(s) personally visualized  -   highlighted in bold No results found.     ASSESSMENT and PLAN  Acute on chronic respiratory failure with hypoxia (HCC) Unimproved  Plan Continue HFNC - goal pulse o x > 88% if possible  Idiopathic pulmonary fibrosis (HCC) PRogressive IPF since dx with flare Did not take anti-fibrotics despite advice (per hx) Not a transplant candidate (prior eval art [facility]) Possuible chronic HP due to damp house.  However hx seems more c/w IPF esp given age, male, white and associatd copd and occupation  Currently end  stage  Plan Continue IV steroid 11/21/2017 - aim for 3-4 week opral course starting 11/22/17 Check autoimmune and hP panel ([facility] but do not have [facility]) Recommend concurrent palliation - hospice eligible - d.w resident  Too advanced for anti-fibrotics or research trial      FAMILY  - Updates: 11/21/2017 --> patient and wife to discuss all of the above   - Inter-disciplinary family meet or Palliative Care meeting due by:  DAy 7. Current LOS is LOS 3 days  CODE STATUS    Code Status Orders  (From admission, onward)         Start     Ordered   11/25/2017 1945  Do not attempt resuscitation (DNR)  Continuous    Question Answer Comment  In the event of cardiac or respiratory ARREST Do not call a "code blue"   In the event of cardiac or respiratory ARREST Do not perform Intubation, CPR, defibrillation or ACLS   In the event of cardiac or respiratory ARREST Use medication by any route, position, wound care, and other measures to relive pain and suffering. May use oxygen, suction and manual treatment of airway obstruction as needed for comfort.      11/26/2017 1945        Code Status History    Date Active Date Inactive Code Status Order ID Comments User Context   11/13/2017 1735 12/07/2017 1945 DNR [id number]  Ellwood Denseumball, Alison, DO ED   08/24/2017 0235 08/24/2017 2045 Full Code [id number]  Rometta EmeryGarba, Mohammad L, MD ED   08/21/2017 1107 08/23/2017 1702 Partial Code [id number]  Alberteen Samanford, Christopher P, MD Inpatient   08/18/2017 0413 08/21/2017 1107 Full Code [id number]  Briscoe Deutscherpyd, Timothy S, MD ED   05/16/2017 2100 05/23/2017 2140 Full Code [id number]  Arlyce HarmanLockamy, Timothy, DO Inpatient    Advance Directive Documentation     Most Recent Value  Type of Advance Directive  Living will, Healthcare Power of Attorney  Pre-existing out of facility DNR order (yellow form or pink MOST form)  -  "MOST" Form in Place?  -        DISPO Keep in floor Agree DNR    > 50% of this > 40 min visit spent  in face to face counseling or/and coordination of care - by this undersigned MD - Dr Kalman ShanMurali Brookelle Pellicane. This includes one  or more of the following documented above: discussion of test results, diagnostic or treatment recommendations, prognosis, risks and benefits of management options, instructions, education, compliance or risk-factor reduction   Dr. Kalman ShanMurali Junious Ragone, M.D., [facility] CenterF.C.C.P Pulmonary and Critical Care Medicine Staff Physician, [facility] Center Director - Interstitial Lung Disease  Program  Pulmonary Fibrosis [facility] [place], [place], [id number]  Pager: [phone], If no answer or between  15:00h - 7:00h: call [phone] Telephone: [phone]

## 2017-11-22 DIAGNOSIS — Z7189 Other specified counseling: Secondary | ICD-10-CM

## 2017-11-22 DIAGNOSIS — Z515 Encounter for palliative care: Secondary | ICD-10-CM

## 2017-11-22 DIAGNOSIS — J449 Chronic obstructive pulmonary disease, unspecified: Secondary | ICD-10-CM

## 2017-11-22 LAB — CBC
HCT: 39.6 % (ref 39.0–52.0)
HEMOGLOBIN: 12 g/dL — AB (ref 13.0–17.0)
MCH: 24.9 pg — AB (ref 26.0–34.0)
MCHC: 30.3 g/dL (ref 30.0–36.0)
MCV: 82.2 fL (ref 78.0–100.0)
PLATELETS: 168 10*3/uL (ref 150–400)
RBC: 4.82 MIL/uL (ref 4.22–5.81)
RDW: 18.3 % — ABNORMAL HIGH (ref 11.5–15.5)
WBC: 12.1 10*3/uL — AB (ref 4.0–10.5)

## 2017-11-22 LAB — CK TOTAL AND CKMB (NOT AT ARMC)
CK TOTAL: 25 U/L — AB (ref 49–397)
CK, MB: 5.5 ng/mL — AB (ref 0.5–5.0)
Relative Index: INVALID (ref 0.0–2.5)

## 2017-11-22 LAB — BASIC METABOLIC PANEL
ANION GAP: 11 (ref 5–15)
BUN: 25 mg/dL — AB (ref 8–23)
CHLORIDE: 99 mmol/L (ref 98–111)
CO2: 27 mmol/L (ref 22–32)
Calcium: 8.9 mg/dL (ref 8.9–10.3)
Creatinine, Ser: 1.06 mg/dL (ref 0.61–1.24)
Glucose, Bld: 144 mg/dL — ABNORMAL HIGH (ref 70–99)
Potassium: 4 mmol/L (ref 3.5–5.1)
SODIUM: 137 mmol/L (ref 135–145)

## 2017-11-22 LAB — SEDIMENTATION RATE: Sed Rate: 7 mm/hr (ref 0–16)

## 2017-11-22 MED ORDER — FUROSEMIDE 10 MG/ML IJ SOLN
40.0000 mg | Freq: Once | INTRAMUSCULAR | Status: AC
  Administered 2017-11-22: 40 mg via INTRAVENOUS
  Filled 2017-11-22: qty 4

## 2017-11-22 NOTE — Consult Note (Addendum)
Consultation Note Date: 11/22/2017   Patient Name: Jose Flores  DOB: 10-30-1950  MRN: [id number]  Age / Sex: 67 y.o., male  PCP: [facility] Referring Physician: Doreene Eland, MD  Reason for Consultation: Establishing goals of care  HPI/Patient Profile: 68 Y/O M with PMX of Pulm fibrosis, HTN, CAD, COPD presenting with difficulty breathing. He is on 6 L O2 at baseline at home. However, he increased it to 10 L recently without any improvement.  Clinical Assessment and Goals of Care: Patient is sitting in bedside chair on high flow nasal cannula. He holds a blanket or cloth over the cannula to increase O2 saturation, as without it, he desaturates to the mid 80's, and in to the high 70's with any activity such as holding a urinal for himself while sitting in the chair.   He is married with no children. He is retired from [employer], and retired in 2015. He has been using O2 for around 4 years. Prior to this year he was able to mow the yard and do things around the house. In January, he noticed decline. He states he has had 8 admissions this year. He spends his time in bed or in the living room. He wears his home high flow O2 at all times now.  It is an exhausting chore to move from the bed to wheelchair, to couch. He has been using a wheelchair for the past 2 months. His appetite is good.   He looked into lung transplant, but could not afford them. He decided that he understood he was here on this earth for a time and would go home to heaven when it was time. He states he is tired, and does not want to suffer any further. He confirms DNR/DNI status. He is currently okay with BIPAP if needed. He feels he would like to pursue going home with hospice and transitioning to comfort care, and possibly hospital death given his tenuous respiratory status however is worried about how his wife will receive this  news. He does not want hospice home placement. Plans for a meeting in the morning at 10:30.        SUMMARY OF RECOMMENDATIONS   Family meeting tomorrow morning at 10:30 to discuss with wife patient's wishes.   Code Status/Advance Care Planning:  DNR    Symptom Management:   Continue high flow cannula.   Palliative Prophylaxis:   Eye Care and Oral Care  Prognosis:   Extremely poor. Desaturation with any activity. Wears high flow cannula at all times with SOB and readmissions.   Discharge Planning: To Be Determined      Primary Diagnoses: Present on Admission: . Shortness of breath . Acute on chronic respiratory failure with hypoxia (HCC) . COPD (chronic obstructive pulmonary disease) (HCC)   I have reviewed the medical record, interviewed the patient and family, and examined the patient. The following aspects are pertinent.  Past Medical History:  Diagnosis Date  . Acid reflux   . COPD (chronic obstructive pulmonary disease) (  HCC)   . Coronary artery disease   . Hypertension   . Insomnia   . Pulmonary fibrosis (HCC)    Social History   Socioeconomic History  . Marital status: Married    Spouse name: Not on file  . Number of children: Not on file  . Years of education: Not on file  . Highest education level: Not on file  Occupational History  . Not on file  Social Needs  . Financial resource strain: Not on file  . Food insecurity:    Worry: Not on file    Inability: Not on file  . Transportation needs:    Medical: Not on file    Non-medical: Not on file  Tobacco Use  . Smoking status: Former Games developer  . Smokeless tobacco: Never Used  . Tobacco comment: quit smoking in 1993  Substance and Sexual Activity  . Alcohol use: Yes    Comment: occ  . Drug use: No  . Sexual activity: Not on file  Lifestyle  . Physical activity:    Days per week: Not on file    Minutes per session: Not on file  . Stress: Not on file  Relationships  . Social connections:     Talks on phone: Not on file    Gets together: Not on file    Attends religious service: Not on file    Active member of club or organization: Not on file    Attends meetings of clubs or organizations: Not on file    Relationship status: Not on file  Other Topics Concern  . Not on file  Social History Narrative  . Not on file   Family History  Problem Relation Age of Onset  . Colon cancer Mother   . Heart failure Father   . Breast cancer Sister    Scheduled Meds: . aspirin  81 mg Oral Daily  . atorvastatin  40 mg Oral q1800  . clopidogrel  75 mg Oral Q breakfast  . digoxin  0.125 mg Oral Daily  . DULoxetine  30 mg Oral Daily  . enoxaparin (LOVENOX) injection  40 mg Subcutaneous Q24H  . fluticasone  1 spray Each Nare Daily  . ipratropium  2 spray Each Nare BID  . losartan  100 mg Oral Daily  . methylPREDNISolone (SOLU-MEDROL) injection  125 mg Intravenous Q6H  . mometasone-formoterol  2 puff Inhalation BID  . pantoprazole  40 mg Oral Daily  . tiotropium  18 mcg Inhalation Daily   Continuous Infusions: PRN Meds:.acetaminophen **OR** acetaminophen, albuterol, ipratropium-albuterol, polyethylene glycol, traZODone Medications Prior to Admission:  Prior to Admission medications   Medication Sig Start Date End Date Taking? Authorizing Provider  albuterol (PROVENTIL HFA;VENTOLIN HFA) 108 (90 Base) MCG/ACT inhaler Inhale 2 puffs into the lungs every 6 (six) hours as needed for wheezing or shortness of breath.   Yes [provider]  aspirin 81 MG chewable tablet Chew 1 tablet (81 mg total) by mouth daily. 08/23/17  Yes Danford, Earl Lites, MD  budesonide-formoterol (SYMBICORT) 160-4.5 MCG/ACT inhaler Inhale 2 puffs into the lungs 2 (two) times daily.   Yes [provider]  clopidogrel (PLAVIX) 75 MG tablet Take 1 tablet (75 mg total) by mouth daily with breakfast. 08/23/17  Yes Danford, Earl Lites, MD  clotrimazole (MYCELEX) 10 MG troche Take 10 mg by mouth  daily as needed (for thrush).   Yes [provider]  digoxin (LANOXIN) 0.125 MG tablet Take 0.125 mg by mouth daily.   Yes  [provider]  DULoxetine (CYMBALTA) 30 MG capsule Take 30 mg by mouth daily.   Yes [provider]  fluticasone (FLONASE) 50 MCG/ACT nasal spray Place 1 spray into both nostrils daily.   Yes [provider]  guaiFENesin (ROBITUSSIN) 100 MG/5ML SOLN Take 10 mLs by mouth every 4 (four) hours as needed for cough.    Yes [provider]  ipratropium (ATROVENT) 0.03 % nasal spray Place 2 sprays into both nostrils every 12 (twelve) hours.   Yes [provider]  ipratropium-albuterol (DUONEB) 0.5-2.5 (3) MG/3ML SOLN Take 3 mLs by nebulization every 6 (six) hours as needed (wheezing).    Yes [provider]  losartan (COZAAR) 100 MG tablet Take 100 mg by mouth daily.   Yes [provider]  Multiple Vitamin (MULTIVITAMIN WITH MINERALS) TABS tablet Take 1 tablet by mouth daily.   Yes [provider]  omeprazole (PRILOSEC) 20 MG capsule Take 20 mg by mouth daily.   Yes [provider]  oxymetazoline (AFRIN) 0.05 % nasal spray Place 1 spray into both nostrils as needed (Nosebleeds). 08/24/17  Yes Vann, Jessica U, DO  tiotropium (SPIRIVA) 18 MCG inhalation capsule Place 18 mcg into inhaler and inhale daily.   Yes [provider]  traZODone (DESYREL) 50 MG tablet Take 50 mg by mouth at bedtime.   Yes [provider]  atorvastatin (LIPITOR) 40 MG tablet Take 1 tablet (40 mg total) by mouth daily at 6 PM. Patient not taking: Reported on 12/02/2017 08/23/17   Alberteen Samanford, Christopher P, MD   Allergies  Allergen Reactions  . Gabapentin Other (See Comments)    [facility]  . Lisinopril Other (See Comments)    [facility]  . Penicillins Other (See Comments)    Further details unknown per pt. Has patient had a PCN reaction causing immediate rash, facial/tongue/throat swelling, SOB or  lightheadedness with hypotension: Unknown Has patient had a PCN reaction causing severe rash involving mucus membranes or skin necrosis: Unknown Has patient had a PCN reaction that required hospitalization: Yes Has patient had a PCN reaction occurring within the last 10 years: Unknown If all of the above answers are "NO", then may proceed with Cephalosporin use.    Review of Systems  Constitutional: Positive for fatigue.  Respiratory: Positive for shortness of breath.     Physical Exam  Constitutional: No distress.  Pulmonary/Chest:  High flow cannula in place.   Neurological: He is alert.  Oriented    Vital Signs: BP (!) 150/97   Pulse 99   Temp 99.9 F (37.7 C) (Oral)   Resp (!) 21   Ht 5\' 8"  (1.727 m)   Wt 72.9 kg   SpO2 92%   BMI 24.44 kg/m  Pain Scale: 0-10   Pain Score: 0-No pain   SpO2: SpO2: 92 % O2 Device:SpO2: 92 % O2 Flow Rate: .O2 Flow Rate (L/min): 25 L/min  IO: Intake/output summary:   Intake/Output Summary (Last 24 hours) at 11/22/2017 1500 Last data filed at 11/22/2017 1445 Gross per 24 hour  Intake 720 ml  Output 1275 ml  Net -555 ml    LBM: Last BM Date: 11/22/17 Baseline Weight: Weight: 72.4 kg Most recent weight: Weight: 72.9 kg     Palliative Assessment/Data:30%     Time In: 1:40 Time Out: 2:50 Time Total: 70 min Greater than 50%  of this time was spent counseling and coordinating care related to the above assessment and plan.  Signed by: Morton Stallrystal Zyir Gassert,  NP   Please contact Palliative Medicine Team phone at [phone] for questions and concerns.  For individual provider: See Loretha StaplerAmion

## 2017-11-22 NOTE — Progress Notes (Signed)
RT weaned FIO2 from 70% to 60%. Patient tolerated well. Patient is currently on 25L/60%. No respiratory distress noted. RT will monitor as needed.

## 2017-11-22 NOTE — Progress Notes (Signed)
Occupational Therapy Treatment Patient Details Name: Jose LimaRoger D Flores MRN: [id number] DOB: 07/30/1950 Today's Date: 11/22/2017    History of present illness Pt is a 67 y/o male admitted secondary to SOB with increased supplemental O2 requirements. PMH including but not limited to interstitial lung disease, COPD, CAD s/p stent 08/2017, right heart failure, and hypertension.   OT comments  Patient pleasant and cooperative, willing to participate in session with encouragement (as patient nervous about de-stating with mobilization).  Completes bed mobility with supervision and bed to recliner transfers with min guard assist stand pivot for safety, greatly increased time required due to de-stating with mobility cueing for pursed lip breathing throughout.  Patient on supplemental 25L 80% blend via HFNC, de-stating to 85% after transitioning to EOB and to 83% after stand pivot to recliner.  RN aware and present upon therapist exit.  Pt is highly motivated. Will continue to follow.     Follow Up Recommendations  Home health OT;Supervision/Assistance - 24 hour    Equipment Recommendations  None recommended by OT    Recommendations for Other Services PT consult    Precautions / Restrictions Precautions Precautions: Fall Precaution Comments: monitor SPO2; O2 dependent Restrictions Weight Bearing Restrictions: No       Mobility Bed Mobility Overal bed mobility: Needs Assistance Bed Mobility: Supine to Sit     Supine to sit: Supervision     General bed mobility comments: increased time and effort, cueing for PLB   Transfers Overall transfer level: Needs assistance Equipment used: None Transfers: Sit to/from Stand;Stand Pivot Transfers Sit to Stand: Min guard Stand pivot transfers: Min guard       General transfer comment: min guard for safety, oxygen saturations dropping on 25L 80% HFNC     Balance Overall balance assessment: Needs assistance Sitting-balance support: Feet  supported Sitting balance-Leahy Scale: Good     Standing balance support: No upper extremity supported Standing balance-Leahy Scale: Fair Standing balance comment: min guard for safety                            ADL either performed or assessed with clinical judgement   ADL Overall ADL's : Needs assistance/impaired                         Toilet Transfer: Min guard;Stand-pivot(simulated to recliner ) StatisticianToilet Transfer Details (indicate cue type and reason): min guard for balance and safety            General ADL Comments: patient destating with limited mobility (stand pivot transfer) on supplemental HFNC 25L 80% blend      Vision       Perception     Praxis      Cognition Arousal/Alertness: Awake/alert Behavior During Therapy: WFL for tasks assessed/performed Overall Cognitive Status: Within Functional Limits for tasks assessed                                          Exercises     Shoulder Instructions       General Comments      Pertinent Vitals/ Pain       Pain Assessment: No/denies pain  Home Living  Prior Functioning/Environment              Frequency  Min 3X/week        Progress Toward Goals  OT Goals(current goals can now be found in the care plan section)  Progress towards OT goals: Progressing toward goals  Acute Rehab OT Goals Patient Stated Goal: "walk to the door and back" OT Goal Formulation: With patient Time For Goal Achievement: 12/04/17 Potential to Achieve Goals: Good  Plan Discharge plan remains appropriate;Frequency remains appropriate    Co-evaluation                 AM-PAC PT "6 Clicks" Daily Activity     Outcome Measure   Help from another person eating meals?: None Help from another person taking care of personal grooming?: A Little Help from another person toileting, which includes using toliet, bedpan, or  urinal?: A Lot Help from another person bathing (including washing, rinsing, drying)?: A Little Help from another person to put on and taking off regular upper body clothing?: A Little Help from another person to put on and taking off regular lower body clothing?: A Little 6 Click Score: 18    End of Session Equipment Utilized During Treatment: Oxygen(HFNC 25L 80% blend)  OT Visit Diagnosis: Unsteadiness on feet (R26.81);Other abnormalities of gait and mobility (R26.89);Muscle weakness (generalized) (M62.81)   Activity Tolerance Patient tolerated treatment well   Patient Left with call bell/phone within reach;in chair;with nursing/sitter in room   Nurse Communication Mobility status        Time: 1125-1150 OT Time Calculation (min): 25 min  Charges: OT General Charges $OT Visit: 1 Visit OT Treatments $Self Care/Home Management : 23-37 mins  Jose Flores, OTR/L  Pager [phone]    Jose Flores 11/22/2017, 1:56 PM

## 2017-11-22 NOTE — Progress Notes (Signed)
Family Medicine Teaching Service Daily Progress Note Intern Pager: [phone]  Patient name: Jose Flores Medical record number: [id number] Date of birth: May 06, 1950 Age: 67 y.o. Gender: male  Primary Care Provider: [facility] Consultants: Pulmonary  Code Status: DNR   Pt Overview and Major Events to Date:  Jose LimaRoger D Flores is a 67 y.o. male presenting with SOB x several weeks.  PMH significant for interstitial lung disease, COPD at home on 10L, CAD s/p stent 08/2017, right heart failure, and hypertension.    IPF exacerbation/progression  Pt follows pulmonologist at the [facility]. Pulmonology notes this is likely due to progression of IPF, unsure categorization of IPF likely hypersensitivity pneumonitis.  Patient currently only on high flow nasal cannula 25 L / 60% FiO2.  Mildly elevated WBC likely secondary to high-dose steroids.  Pulmonology recommending home hospice.  Patient has a life expectancy under 6 months. -Pulm consulted, appreciate recs -Palliative/hospice care consult -IV solumedrol 125 mg Q6 (8/11-8/13) PO to complete 5 days, transition to PO on 8/13 -monitor vitals -will need new O2 certification as likely new baseline and will be leaving on more than prior prescribed 6L -PT recommends home health PT  IPF See above for details. Home medication includes: Symbicort BID, Spiriva 2 puffs Daily, Atrovent nasal spray BID, Duonebs TID about 2-3x week when he remembers to take. Albuterol (does not use often). -continue home meds -f/u pulm recs  Right Heart Failure with CAD Stent place 5/13 in the [facility] ECHO (8/11) without significant changes from previous.  Physical exam without fluid overload.  Low suspicion for CHF contribution. -Lasix IV 40 mg today on 8/13 followed by resumption of home Lasix -continue losartan, digoxin, aspirin, Plavix  Epistaxis Stable.  ED visit in 08/2017 for nose bleeds.  Pt does have afrin at home for nose bleeds 2/2 dryness from  supplemental O2. No bleeding currently. -monitor for nose bleeds  HTN - stable Blood pressure mildly elevated overnight with systolics up to 147.  Heart rate in 70s and 80s. Home meds: Losartan - continue losartan - monitor vitals  Polyuria-resolved Very dark urine at bedside urinal. UA neg, but amber colored.  No AKI on BMP am 8/11  GERD No sxs presently. Home meds: Omeprazole - continue pantoprazole 40 mg p.o.  Sleep disorder Trazodone QHS -continue trazodone  Nerve pain No complaints at this time. Home meds: cymbalta - continue cymbalta  Allergies Takes Flonase, claritin D at home. - continue home meds  FEN/GI: heart healthy Prophylaxis: lovenox  Disposition: Possible DC home following establishment of new HFNC requirement  Subjective:  Spoke with patient today following a hypoxic episode.  He reported a bowel movement that resulted in desatting into the 60s.  Following that his nurse turned his high flow nasal cannula from 60-80 FiO2.  During my interview with the patient he was satting comfortably in the low 90s.  I informed patient that it would be helpful to consult palliative/hospice to begin discussing goals of care and organizing his discharge.  This was not information to [name] and he was comfortable talking with palliative/hospice.  Objective: Temp:  [97.5 F (36.4 C)-99.9 F (37.7 C)] 99.9 F (37.7 C) (08/13 0435) Pulse Rate:  [72-97] 83 (08/13 0341) Resp:  [18-20] 18 (08/13 0341) BP: (128-142)/(78-90) 142/90 (08/12 2016) SpO2:  [83 %-95 %] 94 % (08/13 0341) FiO2 (%):  [60 %-100 %] 60 % (08/13 0455) Weight:  [72.9 kg] 72.9 kg (08/13 0455)   Physical Exam General: Alert and cooperative  and appears to be in no acute distress HEENT: Neck non-tender without lymphadenopathy, masses or thyromegaly Cardio: Normal A1 and S2, no S3 or S4. Rhythm is regular. No murmurs or rubs.   Pulm: Diffuse crackles on lung exam.  Patient satting in low 80s to be  getting our conversation but by the end was comfortable in the low 90s.  Breathing 25 L high flow nasal cannula with 80% FiO2. Abdomen: Bowel sounds normal. Abdomen soft and non-tender.  Extremities: Mild lower extremity edema  neuro: Cranial nerves grossly intact   Laboratory: Recent Labs  Lab 11/19/17 0336 11/20/17 0713 11/21/17 0325  WBC 8.3 11.4* 12.2*  HGB 12.2* 12.1* 11.4*  HCT 40.4 40.5 37.5*  PLT 155 169 160   Recent Labs  Lab 11/19/17 0336 11/20/17 0407 11/21/17 0325  NA 137 136 136  K 4.7 4.6 4.2  CL 98 99 99  CO2 27 25 27   BUN 11 20 23   CREATININE 1.06 1.15 1.08  CALCIUM 8.8* 8.7* 8.7*  GLUCOSE 152* 146* 157*    Imaging/Diagnostic Tests: No results found.   [facility], Jose Farabee, MD 11/22/2017, 6:41 AM PGY-2, [facility] FPTS Intern pager: [phone], text pages welcome

## 2017-11-22 NOTE — Progress Notes (Signed)
Pt was disatting to 66% to below 80% after having a BM and eating BF.  Was on 60% concentration with 25L. Pt denied SOB.  Increased concentration to 80%.  Now pulse Ox around 93-95 % on 80%/25L.  Hinton DyerYoko Huyen Perazzo, RN

## 2017-11-23 LAB — MPO/PR-3 (ANCA) ANTIBODIES: Myeloperoxidase Abs: <9 U/mL (ref 0.0–9.0)

## 2017-11-23 LAB — CBC
HCT: 38.6 % — ABNORMAL LOW (ref 39.0–52.0)
Hemoglobin: 11.8 g/dL — ABNORMAL LOW (ref 13.0–17.0)
MCH: 24.8 pg — ABNORMAL LOW (ref 26.0–34.0)
MCHC: 30.6 g/dL (ref 30.0–36.0)
MCV: 81.3 fL (ref 78.0–100.0)
PLATELETS: 159 10*3/uL (ref 150–400)
RBC: 4.75 MIL/uL (ref 4.22–5.81)
RDW: 18.2 % — AB (ref 11.5–15.5)
WBC: 12.4 10*3/uL — AB (ref 4.0–10.5)

## 2017-11-23 LAB — BASIC METABOLIC PANEL
ANION GAP: 11 (ref 5–15)
BUN: 31 mg/dL — AB (ref 8–23)
CALCIUM: 8.7 mg/dL — AB (ref 8.9–10.3)
CO2: 28 mmol/L (ref 22–32)
Chloride: 99 mmol/L (ref 98–111)
Creatinine, Ser: 1.05 mg/dL (ref 0.61–1.24)
GFR calc Af Amer: >60 mL/min (ref >60)
GFR calc non Af Amer: >60 mL/min (ref >60)
GLUCOSE: 155 mg/dL — AB (ref 70–99)
Potassium: 4 mmol/L (ref 3.5–5.1)
Sodium: 138 mmol/L (ref 135–145)

## 2017-11-23 LAB — ANTI-SCLERODERMA ANTIBODY

## 2017-11-23 LAB — SJOGRENS SYNDROME-A EXTRACTABLE NUCLEAR ANTIBODY: SSA (Ro) (ENA) Antibody, IgG: <0.2 AI (ref 0.0–0.9)

## 2017-11-23 LAB — ALDOLASE: ALDOLASE: 10.9 U/L — AB (ref 3.3–10.3)

## 2017-11-23 LAB — ANTI-DNA ANTIBODY, DOUBLE-STRANDED: ds DNA Ab: <1 IU/mL (ref 0–9)

## 2017-11-23 LAB — ANTINUCLEAR ANTIBODIES, IFA: ANA Ab, IFA: NEGATIVE

## 2017-11-23 LAB — RHEUMATOID FACTOR

## 2017-11-23 LAB — SJOGRENS SYNDROME-B EXTRACTABLE NUCLEAR ANTIBODY: SSB (La) (ENA) Antibody, IgG: <0.2 AI (ref 0.0–0.9)

## 2017-11-23 LAB — CYCLIC CITRUL PEPTIDE ANTIBODY, IGG/IGA: CCP Antibodies IgG/IgA: 24 units — ABNORMAL HIGH (ref 0–19)

## 2017-11-23 MED ORDER — FUROSEMIDE 40 MG PO TABS
40.0000 mg | ORAL_TABLET | Freq: Every day | ORAL | Status: DC
  Administered 2017-11-23: 40 mg via ORAL
  Filled 2017-11-23: qty 1

## 2017-11-23 MED ORDER — PREDNISONE 20 MG PO TABS
40.0000 mg | ORAL_TABLET | Freq: Every day | ORAL | Status: DC
  Administered 2017-11-23 – 2017-11-24 (×2): 40 mg via ORAL
  Filled 2017-11-23 (×2): qty 2

## 2017-11-23 MED ORDER — FUROSEMIDE 40 MG PO TABS
40.0000 mg | ORAL_TABLET | Freq: Two times a day (BID) | ORAL | Status: DC
  Administered 2017-11-23 – 2017-11-24 (×2): 40 mg via ORAL
  Filled 2017-11-23 (×2): qty 1

## 2017-11-23 NOTE — Progress Notes (Addendum)
PT Cancellation Note  Patient Details Name: Jose LimaRoger D Waterhouse MRN: [id number] DOB: 11-14-1950   Cancelled Treatment:    Reason Eval/Treat Not Completed: Other (comment). Pt planning for comfort care and reports he does not wish to continue working with acute PT services at this time. Will discontinue order. Please reconsult if situation changes.    Ina HomesJaclyn Theophil Thivierge, PT, DPT Acute Rehab Services  Pager: [phone]  Malachy ChamberJaclyn L Haliyah Fryman 11/23/2017, 3:44 PM

## 2017-11-23 NOTE — Progress Notes (Signed)
Family Medicine Teaching Service Daily Progress Note Intern Pager: [phone]  Patient name: [facility] Medical record number: [id number] Date of birth: 01/05/1951 Age: 67 y.o. Gender: male  [facility] Consultants: Pulmonary  Code Status: DNR   Pt Overview and Major Events to Date:  Jose [facility] is a 67 y.o. male presenting with SOB x several weeks.  PMH significant for interstitial lung disease, COPD at home on 10L, CAD s/p stent 08/2017, right heart failure, and hypertension.    IPF exacerbation/progression  [facility] Pulmonology notes this is likely due to progression of IPF, unsure categorization of IPF likely hypersensitivity pneumonitis. Per conversation with palliative care today: Patient prefers for hospital hospice stay anticipating a hospital death.  Patient is currently waiting to see family members were returning from vacation, after that a family member meeting patient would like to move to comfort care.  Currently breathing 25 L nasal cannula at 70% FiO2, satting around 90. -Pulm consulted, appreciate recs -Palliative/hospice care consult, plan to meeting with wife and patient today 8/14 -completed IV solumedrol 125 mg Q6 (8/11-8/13)  -PO Pred 40 for 4 weeks -monitor vitals -PT recommends home health PT  IPF See above for details. Home medication includes: Symbicort BID, Spiriva 2 puffs Daily, Atrovent nasal spray BID, Duonebs TID about 2-3x week when he remembers to take. Albuterol (does not use often). -continue home meds -f/u pulm recs  Right Heart Failure with CAD Stent place 5/13 in the [facility]. ECHO (8/11) without significant changes from previous.  Physical exam without fluid overload.  Low suspicion for CHF contribution. -start lasix 40 mg BID -continue losartan, digoxin, aspirin, Plavix  Epistaxis Stable.  ED visit in 08/2017 for nose bleeds.  Pt does have afrin at home for nose bleeds  2/2 dryness from supplemental O2. No bleeding currently. -monitor for nose bleeds  HTN - stable Blood pressure mildly elevated overnight with systolics up to 147.  Heart rate in 70s and 80s. Home meds: Losartan - continue losartan - monitor vitals  Polyuria-resolved Very dark urine at bedside urinal. UA neg, but amber colored.  No AKI on BMP am 8/11  GERD No sxs presently. Home meds: Omeprazole - continue pantoprazole 40 mg p.o.  Sleep disorder Trazodone QHS -continue trazodone  Nerve pain No complaints at this time. Home meds: cymbalta - continue cymbalta  Allergies Takes Flonase, claritin D at home. - continue home meds  FEN/GI: heart healthy Prophylaxis: lovenox  Disposition: Possible DC home following establishment of new HFNC requirement  Subjective:  [name] was visited today.  He had no new concerns.  He denied headache, chest pain, stomachache.  We spoke briefly about his meeting with palliative care yesterday and his upcoming meeting with palliative care and his wife.  He admits some anxiety going into his conversation and we discussed the uses of anxiolytic medication I would be best to avoid benzos in the setting.  Patient appears to have good insight into his situation and states that he has accepted his condition and short life expectancy.  Objective: Temp:  [97.5 F (36.4 C)-97.6 F (36.4 C)] 97.6 F (36.4 C) (08/14 0510) Pulse Rate:  [80-99] 95 (08/13 1639) Resp:  [17-32] 17 (08/14 0510) BP: (124-150)/(72-100) 124/72 (08/14 0510) SpO2:  [67 %-93 %] 88 % (08/14 0510) FiO2 (%):  [60 %-80 %] 70 % (08/14 0510) Weight:  [73.1 kg] 73.1 kg (08/14 0510)   Physical Exam General: Alert and cooperative  and appears to be in no acute distress HEENT: Neck non-tender without lymphadenopathy, masses or thyromegaly Cardio: Normal A1 and S2, no S3 or S4. Rhythm is regular. No murmurs or rubs.   Pulm: Diffuse rales on auscultation.  Patient breathing on 25 L  high flow nasal cannula at 70% FiO2.  Around 90%.   Abdomen: Bowel sounds normal. Abdomen soft and non-tender.  Extremities: 1+ lower extremity edema. Neuro: Cranial nerves grossly intact  Laboratory: Recent Labs  Lab 11/20/17 0713 11/21/17 0325 11/22/17 0440  WBC 11.4* 12.2* 12.1*  HGB 12.1* 11.4* 12.0*  HCT 40.5 37.5* 39.6  PLT 169 160 168   Recent Labs  Lab 11/20/17 0407 11/21/17 0325 11/22/17 0440  NA 136 136 137  K 4.6 4.2 4.0  CL 99 99 99  CO2 25 27 27   BUN 20 23 25*  CREATININE 1.15 1.08 1.06  CALCIUM 8.7* 8.7* 8.9  GLUCOSE 146* 157* 144*    Imaging/Diagnostic Tests: No results found.   Mirian MoFrank, Treazure Nery, MD 11/23/2017, 6:51 AM PGY-2, [place] [facility] FPTS Intern pager: [phone], text pages welcome

## 2017-11-23 NOTE — Consult Note (Deleted)
Consultation Note Date: 11/23/2017   Patient Name: Jose LimaRoger D Flores  DOB: Mar 03, 1951  MRN: [id number]  Age / Sex: 67 y.o., male  [facility] Referring Physician: Doreene ElandEniola, Kehinde T, MD  Reason for Consultation: Establishing goals of care  HPI/Patient Profile: 67 Y/O M with PMX of Pulm fibrosis, HTN, CAD, COPD presenting with difficulty breathing. He is on 6 L O2 at baseline at home. However, he increased it to 10 L recently without any improvement.  Clinical Assessment and Goals of Care: Patient is sitting in bedside chair on high flow nasal cannula at 25 lpm. He continues to hold a cloth over the cannula to increase O2 saturation intermittently with sats in the mid 80's. Wife is at bedside.  We discussed his diagnoses, prognosis, GOC, EOL wishes disposition and options.    The difference between an aggressive medical intervention path and a comfort care path was discussed. [name] explained to his wife that his disease is progressive, and that he is tired of suffering and is ready to leave this world. His wife was tearful, but stated she would abide by his wishes.    Values and goals of care important to patient and family were attempted to be elicited. Expectations at EOL were discussed. We discussed concerns for  symptom management upon discontinuing high flow O2 and his current level of O2 use.   Patient is on 25 lpm of O2 currently. Upon discontinuing high flow oxygen, patient would have rapid decline to end of life. After discussion, patient would like to wait to shift to comfort care here until his sister arrives from [place]. She and her family are on vacation there at this time with plans to fly back Saturday evening. Spoke with his sister on patient's phone via speaker phone at patient's request. She is attempting to arrange an earlier flight ASAP.    In the meantime, he is amenable to  continue high flow cannula with increase in LPM if needed, and transition to BIPAP if needed. If he is transitioned to BIPAP with discomfort or suffering, he would want to shift to comfort care. DNR/DNI. He is okay with lab work and treating the treatable until his family arrives. He does request only checking BP once per shift. He is okay with monitor. Primary RN into room and updated at bedside. Spoke with medical team attending and updated.  ADDENDUM: Family flying in tonight. Plans for likely transition to comfort care tomorrow.        SUMMARY OF RECOMMENDATIONS   Waiting for family to shift to comfort care. Increase high flow if needed. BIPAP okay.   Code Status/Advance Care Planning:  DNR    Symptom Management:   Continue high flow cannula.   If he declines and wishes for comfort care prior to families' arrival, recommend: Morphine for pain and dyspnea, Ativan for anxiety, Haldol for agitation, Robinul for increased secretions.   Palliative will continue to follow and assist with transition to comfort care  when patient's family arrives and he  is ready.   Palliative Prophylaxis:   Eye Care and Oral Care  Prognosis:   Extremely poor. Desaturation with any activity. Wears high flow cannula at all times with SOB and readmissions.   Discharge Planning: Anticipated hospital death.      Primary Diagnoses: Present on Admission: . Shortness of breath . Acute on chronic respiratory failure with hypoxia (HCC) . COPD (chronic obstructive pulmonary disease) (HCC)   I have reviewed the medical record, interviewed the patient and family, and examined the patient. The following aspects are pertinent.  Past Medical History:  Diagnosis Date  . Acid reflux   . COPD (chronic obstructive pulmonary disease) (HCC)   . Coronary artery disease   . Hypertension   . Insomnia   . Pulmonary fibrosis (HCC)    Social History   Socioeconomic History  . Marital status: Married     Spouse name: Not on file  . Number of children: Not on file  . Years of education: Not on file  . Highest education level: Not on file  Occupational History  . Not on file  Social Needs  . Financial resource strain: Not on file  . Food insecurity:    Worry: Not on file    Inability: Not on file  . Transportation needs:    Medical: Not on file    Non-medical: Not on file  Tobacco Use  . Smoking status: Former Games developer  . Smokeless tobacco: Never Used  . Tobacco comment: quit smoking in 1993  Substance and Sexual Activity  . Alcohol use: Yes    Comment: occ  . Drug use: No  . Sexual activity: Not on file  Lifestyle  . Physical activity:    Days per week: Not on file    Minutes per session: Not on file  . Stress: Not on file  Relationships  . Social connections:    Talks on phone: Not on file    Gets together: Not on file    Attends religious service: Not on file    Active member of club or organization: Not on file    Attends meetings of clubs or organizations: Not on file    Relationship status: Not on file  Other Topics Concern  . Not on file  Social History Narrative  . Not on file   Family History  Problem Relation Age of Onset  . Colon cancer Mother   . Heart failure Father   . Breast cancer Sister    Scheduled Meds: . aspirin  81 mg Oral Daily  . atorvastatin  40 mg Oral q1800  . clopidogrel  75 mg Oral Q breakfast  . digoxin  0.125 mg Oral Daily  . DULoxetine  30 mg Oral Daily  . enoxaparin (LOVENOX) injection  40 mg Subcutaneous Q24H  . fluticasone  1 spray Each Nare Daily  . furosemide  40 mg Oral BID  . ipratropium  2 spray Each Nare BID  . losartan  100 mg Oral Daily  . mometasone-formoterol  2 puff Inhalation BID  . pantoprazole  40 mg Oral Daily  . predniSONE  40 mg Oral Q breakfast  . tiotropium  18 mcg Inhalation Daily   Continuous Infusions: PRN Meds:.acetaminophen **OR** acetaminophen, albuterol, ipratropium-albuterol, polyethylene glycol,  traZODone Medications Prior to Admission:  Prior to Admission medications   Medication Sig Start Date End Date Taking? Authorizing Provider  albuterol (PROVENTIL HFA;VENTOLIN HFA) 108 (90 Base) MCG/ACT inhaler Inhale 2 puffs into the lungs every 6 (six) hours  as needed for wheezing or shortness of breath.   Yes [provider]  aspirin 81 MG chewable tablet Chew 1 tablet (81 mg total) by mouth daily. 08/23/17  Yes Danford, Earl Lites, MD  budesonide-formoterol (SYMBICORT) 160-4.5 MCG/ACT inhaler Inhale 2 puffs into the lungs 2 (two) times daily.   Yes [provider]  clopidogrel (PLAVIX) 75 MG tablet Take 1 tablet (75 mg total) by mouth daily with breakfast. 08/23/17  Yes Danford, Earl Lites, MD  clotrimazole (MYCELEX) 10 MG troche Take 10 mg by mouth daily as needed (for thrush).   Yes [provider]  digoxin (LANOXIN) 0.125 MG tablet Take 0.125 mg by mouth daily.   Yes [provider]  DULoxetine (CYMBALTA) 30 MG capsule Take 30 mg by mouth daily.   Yes [provider]  fluticasone (FLONASE) 50 MCG/ACT nasal spray Place 1 spray into both nostrils daily.   Yes [provider]  guaiFENesin (ROBITUSSIN) 100 MG/5ML SOLN Take 10 mLs by mouth every 4 (four) hours as needed for cough.    Yes [provider]  ipratropium (ATROVENT) 0.03 % nasal spray Place 2 sprays into both nostrils every 12 (twelve) hours.   Yes [provider]  ipratropium-albuterol (DUONEB) 0.5-2.5 (3) MG/3ML SOLN Take 3 mLs by nebulization every 6 (six) hours as needed (wheezing).    Yes [provider]  losartan (COZAAR) 100 MG tablet Take 100 mg by mouth daily.   Yes [provider]  Multiple Vitamin (MULTIVITAMIN WITH MINERALS) TABS tablet Take 1 tablet by mouth daily.   Yes [provider]  omeprazole (PRILOSEC) 20 MG capsule Take 20 mg by mouth daily.   Yes [provider]  oxymetazoline (AFRIN) 0.05 % nasal spray  Place 1 spray into both nostrils as needed (Nosebleeds). 08/24/17  Yes Vann, Jessica U, DO  tiotropium (SPIRIVA) 18 MCG inhalation capsule Place 18 mcg into inhaler and inhale daily.   Yes [provider]  traZODone (DESYREL) 50 MG tablet Take 50 mg by mouth at bedtime.   Yes [provider]  atorvastatin (LIPITOR) 40 MG tablet Take 1 tablet (40 mg total) by mouth daily at 6 PM. Patient not taking: Reported on 11/19/17 08/23/17   Alberteen Sam, MD   Allergies  Allergen Reactions  . Gabapentin Other (See Comments)    [facility]  . Lisinopril Other (See Comments)    [facility]  . Penicillins Other (See Comments)    Further details unknown per pt. Has patient had a PCN reaction causing immediate rash, facial/tongue/throat swelling, SOB or lightheadedness with hypotension: Unknown Has patient had a PCN reaction causing severe rash involving mucus membranes or skin necrosis: Unknown Has patient had a PCN reaction that required hospitalization: Yes Has patient had a PCN reaction occurring within the last 10 years: Unknown If all of the above answers are "NO", then may proceed with Cephalosporin use.    Review of Systems  Constitutional: Positive for fatigue.  Respiratory: Positive for shortness of breath.     Physical Exam  Constitutional: No distress.  Pulmonary/Chest:  High flow cannula in place.   Neurological: He is alert.  Oriented    Vital Signs: BP 124/72 (BP Location: Left Arm)   Pulse 82   Temp 97.6 F (36.4 C) (Oral)   Resp 17   Ht 5\' 8"  (1.727 m)   Wt 73.1 kg   SpO2 93%   BMI 24.50 kg/m  Pain Scale: 0-10   Pain Score:  0-No pain   SpO2: SpO2: 93 % O2 Device:SpO2: 93 % O2 Flow Rate: .O2 Flow Rate (L/min): 25 L/min  IO: Intake/output summary:   Intake/Output Summary (Last 24 hours) at 11/23/2017 1138 Last data filed at 11/23/2017 0700 Gross per 24 hour  Intake 840 ml  Output 1505 ml  Net -665 ml    LBM: Last BM Date:  11/22/17 Baseline Weight: Weight: 72.4 kg Most recent weight: Weight: 73.1 kg     Palliative Assessment/Data:30%     Time In: 10:00 Time Out: 11:10 Time Total: 70 min Greater than 50%  of this time was spent counseling and coordinating care related to the above assessment and plan.  Signed by: Morton Stallrystal Elbert Spickler, NP   Please contact Palliative Medicine Team phone at [phone] for questions and concerns.  For individual provider: See Loretha StaplerAmion

## 2017-11-24 MED ORDER — SODIUM CHLORIDE 0.9% FLUSH: 3.0000 mL | INTRAVENOUS | Status: DC | PRN

## 2017-11-24 MED ORDER — ACETAMINOPHEN 325 MG PO TABS: 650.0000 mg | ORAL_TABLET | Freq: Four times a day (QID) | ORAL | Status: DC | PRN

## 2017-11-24 MED ORDER — ONDANSETRON 4 MG PO TBDP: 4.0000 mg | ORAL_TABLET | Freq: Four times a day (QID) | ORAL | Status: DC | PRN

## 2017-11-24 MED ORDER — BIOTENE DRY MOUTH MT LIQD: 15.0000 mL | OROMUCOSAL | Status: DC | PRN

## 2017-11-24 MED ORDER — HALOPERIDOL LACTATE 2 MG/ML PO CONC
0.5000 mg | ORAL | Status: DC | PRN
Start: 2017-11-24 — End: 2017-11-24

## 2017-11-24 MED ORDER — POLYVINYL ALCOHOL 1.4 % OP SOLN
1.0000 [drp] | Freq: Four times a day (QID) | OPHTHALMIC | Status: DC | PRN
  Filled 2017-11-24: qty 15

## 2017-11-24 MED ORDER — HYDROMORPHONE BOLUS VIA INFUSION
1.0000 mg | INTRAVENOUS | Status: DC | PRN
  Filled 2017-11-24: qty 1

## 2017-11-24 MED ORDER — LORAZEPAM 2 MG/ML PO CONC: 1.0000 mg | ORAL | Status: DC | PRN

## 2017-11-24 MED ORDER — ACETAMINOPHEN 650 MG RE SUPP: 650.0000 mg | Freq: Four times a day (QID) | RECTAL | Status: DC | PRN

## 2017-11-24 MED ORDER — GLYCOPYRROLATE 0.2 MG/ML IJ SOLN: 0.2000 mg | INTRAMUSCULAR | Status: DC | PRN

## 2017-11-24 MED ORDER — LORAZEPAM 1 MG PO TABS: 1.0000 mg | ORAL_TABLET | ORAL | Status: DC | PRN

## 2017-11-24 MED ORDER — LORAZEPAM 2 MG/ML IJ SOLN
1.0000 mg | INTRAMUSCULAR | Status: DC | PRN
  Administered 2017-11-24: 1 mg via INTRAVENOUS
  Filled 2017-11-24: qty 1

## 2017-11-24 MED ORDER — SODIUM CHLORIDE 0.9 % IV SOLN
1.0000 mg/h | INTRAVENOUS | Status: DC
  Administered 2017-11-24: 1 mg/h via INTRAVENOUS
  Filled 2017-11-24: qty 5

## 2017-11-24 MED ORDER — GLYCOPYRROLATE 1 MG PO TABS: 1.0000 mg | ORAL_TABLET | ORAL | Status: DC | PRN

## 2017-11-24 MED ORDER — SODIUM CHLORIDE 0.9% FLUSH
3.0000 mL | Freq: Two times a day (BID) | INTRAVENOUS | Status: DC
  Administered 2017-11-24: 3 mL via INTRAVENOUS

## 2017-11-24 MED ORDER — FUROSEMIDE 80 MG PO TABS: 80.0000 mg | ORAL_TABLET | Freq: Two times a day (BID) | ORAL | Status: DC

## 2017-11-24 MED ORDER — SODIUM CHLORIDE 0.9 % IV SOLN
1.0000 mg/h | INTRAVENOUS | Status: DC
  Filled 2017-11-24: qty 5

## 2017-11-24 MED ORDER — HALOPERIDOL LACTATE 5 MG/ML IJ SOLN: 0.5000 mg | INTRAMUSCULAR | Status: DC | PRN

## 2017-11-24 MED ORDER — HYDROMORPHONE BOLUS VIA INFUSION
0.5000 mg | INTRAVENOUS | Status: DC | PRN
  Administered 2017-11-24: 0.5 mg via INTRAVENOUS
  Filled 2017-11-24: qty 1

## 2017-11-24 MED ORDER — HALOPERIDOL 0.5 MG PO TABS: 0.5000 mg | ORAL_TABLET | ORAL | Status: DC | PRN

## 2017-11-24 MED ORDER — HYDROMORPHONE BOLUS VIA INFUSION
1.0000 mg | Freq: Once | INTRAVENOUS | Status: AC
  Administered 2017-11-24: 1 mg via INTRAVENOUS
  Filled 2017-11-24: qty 1

## 2017-11-24 MED ORDER — SODIUM CHLORIDE 0.9 % IV SOLN
250.0000 mL | INTRAVENOUS | Status: DC | PRN
  Administered 2017-11-24: 250 mL via INTRAVENOUS

## 2017-11-24 MED ORDER — ONDANSETRON HCL 4 MG/2ML IJ SOLN: 4.0000 mg | Freq: Four times a day (QID) | INTRAMUSCULAR | Status: DC | PRN

## 2017-11-24 MED ORDER — LORAZEPAM BOLUS VIA INFUSION: 1.0000 mg | Freq: Once | INTRAVENOUS | Status: DC

## 2017-11-24 MED ORDER — LORAZEPAM 2 MG/ML IJ SOLN
1.0000 mg | Freq: Once | INTRAMUSCULAR | Status: AC
  Administered 2017-11-24: 1 mg via INTRAVENOUS
  Filled 2017-11-24: qty 1

## 2017-11-25 LAB — HYPERSENSITIVITY PNEUMONITIS
A. PULLULANS ABS: NEGATIVE
A.Fumigatus #1 Abs: NEGATIVE
Micropolyspora faeni, IgG: NEGATIVE
Pigeon Serum Abs: NEGATIVE
THERMOACT. SACCHARII: NEGATIVE
THERMOACTINOMYCES VULGARIS IGG: NEGATIVE

## 2017-12-11 NOTE — Progress Notes (Addendum)
Daily Progress Note   Patient Name: Jose LimaRoger D Flores       Date: 03-09-2018 DOB: 1950-07-21  Age: 67 y.o. MRN#: [id number] Attending Physician: Westley ChandlerBrown, Carina M, MD [facility] Admit Date: 11/12/2017  Reason for Consultation/Follow-up: Establishing goals of care  Subjective: Patient resting in bed on high flow O2 at 25 lpm; he holds the cloth over his nose and mouth intermittently to increase O2 st . Family arrived from [place] and are at bedside this morning, and wife arrived shortly after. [name] states he is ready to go, he is tired of suffering. He requests that he be changed into a tee shirt, and hat with sunglasses sitting on bill which are in a bag at bedside. He states he doesn't want to die in a hospital gown. His family states they have all had a chance to visit and speak with him, and they are at peace with his decision.  MOST form completed for DNR, comfort measures, no abx, no IV fluids, and no feeding tube.   Dilaudid infusion at 1mg /hr with bolus of 0.5mg  q 15 min PRN initiated for pain and dyspnea. May need to increase infusion based on bolus needs.  Ativan 1mg  q 4 hrs PRN ordered for anxiety. May need to schedule Ativan. Consider Ativan infusion if needed for comfort.  Haldol 0.5mg  q 4 hrs PRN ordered for agitation  Robinul 0.2mg  q 4 hrs PRN ordered for excessive secretions.    Patient on comfort care. He appears comfortable, resting with eyes closed. Chaplain at bedside.   Addendum: Returned to floor to assess status and comfort, patient expired around 1:50 per nursing.   Length of Stay: 6  Current Medications: Scheduled Meds:  . fluticasone  1 spray Each Nare Daily  . furosemide  80 mg Oral BID  . ipratropium  2 spray Each Nare BID  .  mometasone-formoterol  2 puff Inhalation BID  . predniSONE  40 mg Oral Q breakfast  . sodium chloride flush  3 mL Intravenous Q12H  . tiotropium  18 mcg Inhalation Daily    Continuous Infusions: . sodium chloride 10 mL/hr at 01-01-2018 1232  . HYDROmorphone 1 mg/hr (01-01-2018 1232)    PRN Meds: sodium chloride, acetaminophen **OR** acetaminophen, albuterol, antiseptic oral rinse, [DISCONTINUED] glycopyrrolate **OR** [DISCONTINUED] glycopyrrolate **OR** glycopyrrolate, [  DISCONTINUED] haloperidol **OR** [DISCONTINUED] haloperidol **OR** haloperidol lactate, HYDROmorphone, ipratropium-albuterol, [DISCONTINUED] LORazepam **OR** [DISCONTINUED] LORazepam **OR** LORazepam, [DISCONTINUED] ondansetron **OR** ondansetron (ZOFRAN) IV, polyethylene glycol, polyvinyl alcohol, sodium chloride flush, traZODone  Physical Exam  Constitutional: No distress.  Neurological: He is alert.  Skin: Skin is warm and dry.            Vital Signs: BP 118/73 (BP Location: Left Arm)   Pulse 100   Temp 97.9 F (36.6 C) (Oral)   Resp 20   Ht 5\' 8"  (1.727 m)   Wt 73.1 kg   SpO2 94%   BMI 24.50 kg/m  SpO2: SpO2: 94 % O2 Device: O2 Device: High Flow Nasal Cannula O2 Flow Rate: O2 Flow Rate (L/min): 25 L/min  Intake/output summary:   Intake/Output Summary (Last 24 hours) at 11/23/2017 1316 Last data filed at 11/28/2017 1232 Gross per 24 hour  Intake 698.24 ml  Output 650 ml  Net 48.24 ml   LBM: Last BM Date: 11/22/17 Baseline Weight: Weight: 72.4 kg Most recent weight: Weight: (Refused)       Palliative Assessment/Data: 10%      Patient Active Problem List   Diagnosis Date Noted  . Idiopathic pulmonary fibrosis (HCC)   . Shortness of breath 12/09/17  . Epistaxis 08/23/2017  . 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 Pericardial effusion 08/18/2017  . Acute on chronic respiratory failure with hypoxia (HCC)   . Essential hypertension   . Malnutrition of moderate degree 05/20/2017  .  SOB (shortness of breath)   . COPD (chronic obstructive pulmonary disease) (HCC)   . Hypoxia 05/16/2017  . Hypoxic episode 05/16/2017    Palliative Care Assessment & Plan     Recommendations/Plan:  Comfort measures in place. Unstable for transfer.     Code Status:    Code Status Orders  (From admission, onward)         Start     Ordered   12/04/2017 1035  Do not attempt resuscitation (DNR)  Continuous    Question Answer Comment  In the event of cardiac or respiratory ARREST Do not call a "code blue"   In the event of cardiac or respiratory ARREST Do not perform Intubation, CPR, defibrillation or ACLS   In the event of cardiac or respiratory ARREST Use medication by any route, position, wound care, and other measures to relive pain and suffering. May use oxygen, suction and manual treatment of airway obstruction as needed for comfort.   Comments MOST form on chart      11/26/2017 1034        Code Status History    Date Active Date Inactive Code Status Order ID Comments User Context   11/16/2017 1014 11/27/2017 1034 DNR [id number]  Morton Stall, NP Inpatient   2017-12-09 1945 12/10/2017 1014 DNR [id number]  Ellwood Dense, DO Inpatient   12/09/2017 1735 12/09/2017 1945 DNR [id number]  Ellwood Dense, DO ED   08/24/2017 0235 08/24/2017 2045 Full Code [id number]  Rometta Emery, MD ED   08/21/2017 1107 08/23/2017 1702 Partial Code [id number]  Alberteen Sam, MD Inpatient   08/18/2017 0413 08/21/2017 1107 Full Code [id number]  Briscoe Deutscher, MD ED   05/16/2017 2100 05/23/2017 2140 Full Code [id number]  Arlyce Harman, DO Inpatient    Advance Directive Documentation     Most Recent Value  Type of Advance Directive  Living will, Healthcare Power of Attorney  Pre-existing out of facility DNR order (yellow form or pink MOST form)  -  "  MOST" Form in Place?  -       Prognosis:   Hours - Days  Discharge Planning:  Anticipated Hospital Death  Care plan was discussed with  primary team attending and PGY 2.   Thank you for allowing the Palliative Medicine Team to assist in the care of this patient.   Time In: 9:00 11:30 Time Out: 10:20 1:00 Total Time 70 90 Total 160 min Prolonged Time Billed yes      Greater than 50%  of this time was spent counseling and coordinating care related to the above assessment and plan.  Morton Stallrystal Sallie Maker, NP  Please contact Palliative Medicine Team phone at [phone] for questions and concerns.

## 2017-12-11 NOTE — Progress Notes (Addendum)
Family Medicine Teaching Service Daily Progress Note Intern Pager: [phone]  Patient name: Jose LimaRoger D Flores Medical record number: [id number] Date of birth: 04/04/1951 Age: 67 y.o. Gender: male  [facility] Consultants: Pulmonary  Code Status: DNR/DNI  Pt Overview and Major Events to Date:  Jose LimaRoger D Zahner is a [age] y.o. male presenting with SOB x several weeks.  PMH significant for interstitial lung disease, COPD at home on 10L, CAD s/p stent 08/2017, right heart failure, and hypertension.    Comfort Care Pt to be transitioned to comfort care per palliative/hospice conversation. Will transition today. Discontinue unnecessary medications, focus on comfort, and minimize disturbances.  -DNR/DNI -Appreciate Palliative and Hospice recommendations.   IPF exacerbation/progression Currently breathing high flow nasal cannula 25 L at 80% FiO2.  IPF Home medication includes: Symbicort BID, Spiriva 2 puffs Daily, Atrovent nasal spray BID, Duonebs TID about 2-3x/ week when he remembers.Albuterol (does not use often).    Right Heart Failure with CAD Will increase Lasix as needed.   Epistaxis -monitor for nose bleeds  HTN - stable Discontinue home anti-hypertensives.   FEN/GI: comfort   Disposition: Patient has decided to transition to comfort care in anticipation of a hospital death  Subjective:  Patient seen this morning he was accompanied by his sister.  Spoke briefly about his decision to transition to comfort care patient seems to have a healthy attitude regarding his medical situation right now.  He has no medical complaints at this time.  Denies headache, chest pain, stomach pain.  He was informed that we were happy to address concerns he might have been have services (i.e. chaplain service) available if he would find that helpful.  Objective: Temp:  [97.4 F (36.3 C)] 97.4 F (36.3 C) (08/14 1956) Pulse Rate:  [82-95] 95 (08/14 1956) Resp:   [20] 20 (08/14 1956) BP: (123)/(87) 123/87 (08/14 1956) SpO2:  [90 %-96 %] 96 % (08/15 0300) FiO2 (%):  [70 %-85 %] 80 % (08/15 0300)   Physical Exam General: Sitting comfortably in bed with his high flow nasal cannula.  Accompanied by his sister engaged in the conversation and pleasant. HEENT: Neck non-tender without lymphadenopathy, masses or thyromegaly Cardio: Normal A1 and S2, no S3 or S4. No murmurs or rubs.   Pulm: On 25 L high flow nasal cannula 80% FiO2.  Diffuse crackles on auscultation. Abdomen: Bowel sounds normal. Abdomen soft and non-tender.  Extremities: 1+ pitting edema in lower extremities bilaterally. Neuro: Cranial nerves grossly intact   Laboratory: Recent Labs  Lab 11/21/17 0325 11/22/17 0440 11/23/17 0606  WBC 12.2* 12.1* 12.4*  HGB 11.4* 12.0* 11.8*  HCT 37.5* 39.6 38.6*  PLT 160 168 159   Recent Labs  Lab 11/21/17 0325 11/22/17 0440 11/23/17 0606  NA 136 137 138  K 4.2 4.0 4.0  CL 99 99 99  CO2 27 27 28   BUN 23 25* 31*  CREATININE 1.08 1.06 1.05  CALCIUM 8.7* 8.9 8.7*  GLUCOSE 157* 144* 155*    Imaging/Diagnostic Tests: No results found.   Mirian MoFrank, Peter, MD 2017/05/26, 6:36 AM PGY-2, [facility] FPTS Intern pager: [phone], text pages welcome

## 2017-12-11 NOTE — Progress Notes (Signed)
Patient's unfinished Dilaudid gtt was wasted by Madalyn RobJasmina Jerlean Peralta, RN and Julien Girtobyn Wofford, RN.

## 2017-12-11 NOTE — Discharge Summary (Addendum)
Family Medicine Teaching [facility] Death Summary  Patient name: Jose Flores Medical record number: [id number] Date of birth: 06-29-1950 Age: 67 y.o. Gender: male Date of Admission: 25-Nov-2017  Date of Death: 12-01-2017 Admitting Physician: Doreene Eland, MD  Primary Care Provider: [facility] Consultations: pulmonology  Indication for Hospitalization: Dyspnea  Diagnoses/Problem List:  IPF exacerbation/progress Right heart failure Epistaxis GERD Sleep Disorder Nerve pain Seasonal Allergies  Disposition: Deceased   Brief Hospital Course:  Jose Flores a 67 y.o.maleWho presented to the ED on 11/26/22 with SOB x several weeks.PMH significant for interstitial lung disease, COPD at home on 10L, CADs/p stent 08/2017, right heart failure, and hypertension.  On presentation to the ED, pt was breathing 15L non rebreather.  And had been increasing his home HFNC steadily over the preceding month. Pt was admitted for treatment of ILD exacerbation with possible contribution from CHF.  Solumedrol 125 mg Q6 hrs were given for the following three days and then transitioned to PO prednisone 40 mg daily.  In that time, pt appeared to become increasingly comfortable compared to admission and was maintaning O2 sats in the 80s and 90s on 25 L HFNC 60-80 FiO2.   On 8/14 palliative was consulted and pt decided to transition to comfort care after a final opportunity to meet with his loved ones.  On 12-02-22, pt appeared unchanged from previous exams.  Sating in the 80s and 90s on 25 L HFNC 80% FiO2, pt was sitting peacefully conversing with sister on 12/02/22 during am rounds.  Dilaudid gtt was started by palliative at 11:19 am.  Pt passed away on December 01, 2017.    Significant Procedures:  none  Significant Labs and Imaging:  Recent Labs  Lab 11/21/17 0325 11/22/17 0440 11/23/17 0606  WBC 12.2* 12.1* 12.4*  HGB 11.4* 12.0* 11.8*  HCT 37.5* 39.6 38.6*  PLT 160 168 159   Recent Labs  Lab  11/21/17 0325 11/22/17 0440 11/23/17 0606  NA 136 137 138  K 4.2 4.0 4.0  CL 99 99 99  CO2 27 27 28   GLUCOSE 157* 144* 155*  BUN 23 25* 31*  CREATININE 1.08 1.06 1.05  CALCIUM 8.7* 8.9 8.7*   Respiratory panel negative Strep pneumo negative Legionella neg HIV nonreactive MRSA negative Troponin 0.03 BNP 976.3  Urinalysis    Component Value Date/Time   COLORURINE AMBER (A) 11/19/2017 1714   APPEARANCEUR CLEAR 11/19/2017 1714   LABSPEC 1.041 (H) 11/19/2017 1714   PHURINE 5.0 11/19/2017 1714   GLUCOSEU NEGATIVE 11/19/2017 1714   HGBUR NEGATIVE 11/19/2017 1714   BILIRUBINUR NEGATIVE 11/19/2017 1714   KETONESUR 5 (A) 11/19/2017 1714   PROTEINUR NEGATIVE 11/19/2017 1714   NITRITE NEGATIVE 11/19/2017 1714   LEUKOCYTESUR NEGATIVE 11/19/2017 1714   Ct Angio Chest Pe W And/or Wo Contrast  Result Date: 11/25/17 CLINICAL DATA:  Shortness of breath and hypoxia. EXAM: CT ANGIOGRAPHY CHEST WITH CONTRAST TECHNIQUE: Multidetector CT imaging of the chest was performed using the standard protocol during bolus administration of intravenous contrast. Multiplanar CT image reconstructions and MIPs were obtained to evaluate the vascular anatomy. CONTRAST:  ISOVUE-370 IOPAMIDOL (ISOVUE-370) INJECTION 76% COMPARISON:  Portable chest obtained earlier today and chest CTA dated 08/18/2017 and 05/16/2017. FINDINGS: Cardiovascular: Normally opacified pulmonary arteries with no pulmonary arterial filling defects seen. Enlarged heart. Pericardial effusion with a maximum thickness of 1.8 cm. Enlarged central pulmonary arteries with a transverse diameter of the main pulmonary artery of 3.4 cm. Atheromatous calcifications, including the coronary arteries and  aorta. Mediastinum/Nodes: Again demonstrated are multiple enlarged mediastinal nodes. The largest is a subcarinal node with a short axis diameter of 2.9 cm on image number 65 series 5, previously 3.0 cm in corresponding diameter on 08/18/2017. Superior  right paratracheal node with a short axis diameter of 2.1 cm on image number 34 series 5, previously 1.7 cm. Left lower paratracheal node with a short axis diameter of 1.8 cm on image number 49 series 5, previously 1.7 cm. Right paratracheal node with a short axis diameter of 1.4 cm on image number 45 series 5, previously 1.6 cm. Enlarged right celiac axis node with a short axis diameter of 1.0 cm on image number 141 series 5, not previously included. Also demonstrated is an enlarged portacaval node with a short axis diameter of 1.6 cm on image number 149 series 5, previously not included. Lungs/Pleura: Extensive bilateral bullous changes, honeycombing and ground-glass interstitial opacity without significant change. Interval small right pleural effusion, including fissural fluid. Upper Abdomen: Enlarged upper abdominal lymph nodes, described above. Musculoskeletal: Thoracic and lower cervical spine degenerative changes. Review of the MIP images confirms the above findings. IMPRESSION: 1. No pulmonary emboli seen. 2. Interval small right pleural effusion. 3. Small to moderate-sized pericardial effusion, increased. 4.  Calcific coronary artery and aortic atherosclerosis. 5. Enlarged central pulmonary arteries compatible with pulmonary arterial hypertension. 6. Cardiomegaly. 7. Stable interstitial fibrosis and centrilobular and paraseptal emphysema with possible active interstitial inflammatory changes. 8. No significant change in previously demonstrated mediastinal adenopathy with interval inclusion of mild upper abdominal adenopathy. Aortic Atherosclerosis (ICD10-I70.0) and Emphysema (ICD10-J43.9). Electronically Signed   By: Beckie SaltsSteven  Reid M.D.   On: 11/10/2017 18:00   Dg Chest Portable 1 View  Result Date: 11/21/2017 CLINICAL DATA:  Short of breath.  Low oxygen saturation at home. EXAM: PORTABLE CHEST 1 VIEW COMPARISON:  08/23/2017 and older studies. FINDINGS: Mild enlargement of the cardiopericardial silhouette.  No mediastinal or hilar masses. Irregularly thickened interstitial markings noted bilaterally, without change. No evidence of pneumonia or pulmonary edema. No convincing pleural effusion and no pneumothorax. Skeletal structures are grossly intact. IMPRESSION: 1. No acute cardiopulmonary disease. 2. Mild cardiomegaly and stable changes of interstitial fibrosis. Electronically Signed   By: Amie Portlandavid  Ormond M.D.   On: 11/16/2017 13:28   Mirian MoFrank, Peter, MD 11/27/2017, 11:31 AM PGY-1, [facility]   FMTS Attending Note: Terisa Starrarina Brown, MD  Pager [phone]  Office [phone]  I agree with the resident's findings, assessment and care plan. See progress note from same day.

## 2017-12-11 NOTE — Progress Notes (Signed)
Chaplain visited and prayed with family. Family expressed their appreciation for the visit.

## 2017-12-11 NOTE — Progress Notes (Signed)
Progress Note Date: 11/23/2017   Patient Name: Jose LimaRoger D Demaria  DOB: [birth year]  MRN: [id number]  Age / Sex: 67 y.o., male  [facility] Referring Physician: Doreene ElandEniola, Kehinde T, MD  Reason for Consultation: Establishing goals of care  HPI/Patient Profile: 67 Y/O M with PMX of Pulm fibrosis, HTN, CAD, COPD presenting with difficulty breathing. He is on 6 L O2 at baseline at home. However, he increased it to 10 L recently without any improvement.  Clinical Assessment and Goals of Care: Patient is sitting in bedside chair on high flow nasal cannula at 25 lpm. He continues to hold a cloth over the cannula to increase O2 saturation intermittently with sats in the mid 80's. Wife is at bedside.  We discussed his diagnoses, prognosis, GOC, EOL wishes disposition and options.    The difference between an aggressive medical intervention path and a comfort care path was discussed. Mr. [name] explained to his wife that his disease is progressive, and that he is tired of suffering and is ready to leave this world. His wife was tearful, but stated she would abide by his wishes.    Values and goals of care important to patient and family were attempted to be elicited. Expectations at EOL were discussed. We discussed concerns for  symptom management upon discontinuing high flow O2 and his current level of O2 use.   Patient is on 25 lpm of O2 currently. Upon discontinuing high flow oxygen, patient would have rapid decline to end of life. After discussion, patient would like to wait to shift to comfort care here until [place] from [place]. She and her family are on vacation there at this time with plans to fly back Saturday evening. Spoke with his sister on patient's phone via speaker phone at patient's request. She is attempting to arrange an earlier flight ASAP.    In the meantime, he is amenable to continue high flow cannula with increase in LPM if  needed, and transition to BIPAP if needed. If he is transitioned to BIPAP with discomfort or suffering, he would want to shift to comfort care. DNR/DNI. He is okay with lab work and treating the treatable until his family arrives. He does request only checking BP once per shift. He is okay with monitor. Primary RN into room and updated at bedside. Spoke with medical team attending and updated.  ADDENDUM: Family flying in tonight. Plans for likely transition to comfort care tomorrow.        SUMMARY OF RECOMMENDATIONS   Waiting for family to shift to comfort care. Increase high flow if needed. BIPAP okay.   Code Status/Advance Care Planning:  DNR    Symptom Management:   Continue high flow cannula.   If he declines and wishes for comfort care prior to families' arrival, recommend: Morphine for pain and dyspnea, Ativan for anxiety, Haldol for agitation, Robinul for increased secretions.   Palliative will continue to follow and assist with transition to comfort care  when patient's family arrives and he is ready.   Palliative Prophylaxis:   Eye Care and Oral Care  Prognosis:   Extremely poor. Desaturation with any activity. Wears high flow cannula at all times with SOB and readmissions.   Discharge Planning: Anticipated hospital death.      Primary Diagnoses: Present on Admission: . Shortness of breath . Acute on chronic respiratory failure with hypoxia (HCC) . COPD (chronic obstructive pulmonary disease) (HCC)   I have reviewed the medical record, interviewed the  patient and family, and examined the patient. The following aspects are pertinent.  Past Medical History:  Diagnosis Date  . Acid reflux   . COPD (chronic obstructive pulmonary disease) (HCC)   . Coronary artery disease   . Hypertension   . Insomnia   . Pulmonary fibrosis (HCC)    Social History   Socioeconomic History  . Marital status: Married    Spouse name: Not on file  . Number of children: Not on  file  . Years of education: Not on file  . Highest education level: Not on file  Occupational History  . Not on file  Social Needs  . Financial resource strain: Not on file  . Food insecurity:    Worry: Not on file    Inability: Not on file  . Transportation needs:    Medical: Not on file    Non-medical: Not on file  Tobacco Use  . Smoking status: Former Games developer  . Smokeless tobacco: Never Used  . Tobacco comment: quit smoking in 1993  Substance and Sexual Activity  . Alcohol use: Yes    Comment: occ  . Drug use: No  . Sexual activity: Not on file  Lifestyle  . Physical activity:    Days per week: Not on file    Minutes per session: Not on file  . Stress: Not on file  Relationships  . Social connections:    Talks on phone: Not on file    Gets together: Not on file    Attends religious service: Not on file    Active member of club or organization: Not on file    Attends meetings of clubs or organizations: Not on file    Relationship status: Not on file  Other Topics Concern  . Not on file  Social History Narrative  . Not on file   Family History  Problem Relation Age of Onset  . Colon cancer Mother   . Heart failure Father   . Breast cancer Sister    Scheduled Meds: . aspirin  81 mg Oral Daily  . atorvastatin  40 mg Oral q1800  . clopidogrel  75 mg Oral Q breakfast  . digoxin  0.125 mg Oral Daily  . DULoxetine  30 mg Oral Daily  . enoxaparin (LOVENOX) injection  40 mg Subcutaneous Q24H  . fluticasone  1 spray Each Nare Daily  . furosemide  40 mg Oral BID  . ipratropium  2 spray Each Nare BID  . losartan  100 mg Oral Daily  . mometasone-formoterol  2 puff Inhalation BID  . pantoprazole  40 mg Oral Daily  . predniSONE  40 mg Oral Q breakfast  . tiotropium  18 mcg Inhalation Daily   Continuous Infusions: PRN Meds:.acetaminophen **OR** acetaminophen, albuterol, ipratropium-albuterol, polyethylene glycol, traZODone Medications Prior to Admission:  Prior to  Admission medications   Medication Sig Start Date End Date Taking? Authorizing Provider  albuterol (PROVENTIL HFA;VENTOLIN HFA) 108 (90 Base) MCG/ACT inhaler Inhale 2 puffs into the lungs every 6 (six) hours as needed for wheezing or shortness of breath.   Yes [provider]  aspirin 81 MG chewable tablet Chew 1 tablet (81 mg total) by mouth daily. 08/23/17  Yes Danford, Earl Lites, MD  budesonide-formoterol (SYMBICORT) 160-4.5 MCG/ACT inhaler Inhale 2 puffs into the lungs 2 (two) times daily.   Yes [provider]  clopidogrel (PLAVIX) 75 MG tablet Take 1 tablet (75 mg total) by mouth daily with breakfast. 08/23/17  Yes Danford, Earl Lites,  MD  clotrimazole (MYCELEX) 10 MG troche Take 10 mg by mouth daily as needed (for thrush).   Yes [provider]  digoxin (LANOXIN) 0.125 MG tablet Take 0.125 mg by mouth daily.   Yes [provider]  DULoxetine (CYMBALTA) 30 MG capsule Take 30 mg by mouth daily.   Yes [provider]  fluticasone (FLONASE) 50 MCG/ACT nasal spray Place 1 spray into both nostrils daily.   Yes [provider]  guaiFENesin (ROBITUSSIN) 100 MG/5ML SOLN Take 10 mLs by mouth every 4 (four) hours as needed for cough.    Yes [provider]  ipratropium (ATROVENT) 0.03 % nasal spray Place 2 sprays into both nostrils every 12 (twelve) hours.   Yes [provider]  ipratropium-albuterol (DUONEB) 0.5-2.5 (3) MG/3ML SOLN Take 3 mLs by nebulization every 6 (six) hours as needed (wheezing).    Yes [provider]  losartan (COZAAR) 100 MG tablet Take 100 mg by mouth daily.   Yes [provider]  Multiple Vitamin (MULTIVITAMIN WITH MINERALS) TABS tablet Take 1 tablet by mouth daily.   Yes [provider]  omeprazole (PRILOSEC) 20 MG capsule Take 20 mg by mouth daily.   Yes [provider]  oxymetazoline (AFRIN) 0.05 % nasal spray Place 1 spray into both nostrils as needed  (Nosebleeds). 08/24/17  Yes Vann, Jessica U, DO  tiotropium (SPIRIVA) 18 MCG inhalation capsule Place 18 mcg into inhaler and inhale daily.   Yes [provider]  traZODone (DESYREL) 50 MG tablet Take 50 mg by mouth at bedtime.   Yes [provider]  atorvastatin (LIPITOR) 40 MG tablet Take 1 tablet (40 mg total) by mouth daily at 6 PM. Patient not taking: Reported on 11/20/2017 08/23/17   Alberteen Sam, MD   Allergies  Allergen Reactions  . Gabapentin Other (See Comments)    [facility]  . Lisinopril Other (See Comments)    [facility]  . Penicillins Other (See Comments)    Further details unknown per pt. Has patient had a PCN reaction causing immediate rash, facial/tongue/throat swelling, SOB or lightheadedness with hypotension: Unknown Has patient had a PCN reaction causing severe rash involving mucus membranes or skin necrosis: Unknown Has patient had a PCN reaction that required hospitalization: Yes Has patient had a PCN reaction occurring within the last 10 years: Unknown If all of the above answers are "NO", then may proceed with Cephalosporin use.    Review of Systems  Constitutional: Positive for fatigue.  Respiratory: Positive for shortness of breath.     Physical Exam  Constitutional: No distress.  Pulmonary/Chest:  High flow cannula in place.   Neurological: He is alert.  Oriented    Vital Signs: BP 124/72 (BP Location: Left Arm)   Pulse 82   Temp 97.6 F (36.4 C) (Oral)   Resp 17   Ht 5\' 8"  (1.727 m)   Wt 73.1 kg   SpO2 93%   BMI 24.50 kg/m  Pain Scale: 0-10   Pain Score: 0-No pain   SpO2: SpO2: 93 % O2 Device:SpO2: 93 % O2 Flow Rate: .O2 Flow Rate (L/min): 25 L/min  IO: Intake/output summary:   Intake/Output Summary (Last 24 hours) at 11/23/2017 1138 Last data filed at 11/23/2017 0700 Gross per 24 hour  Intake 840 ml  Output 1505 ml  Net -665 ml    LBM: Last BM Date: 11/22/17 Baseline Weight: Weight: 72.4  kg Most recent weight: Weight: 73.1 kg  Palliative Assessment/Data:30%     Time In: 10:00 Time Out: 11:10 Time Total: 70 min Greater than 50%  of this time was spent counseling and coordinating care related to the above assessment and plan.  Signed by: Morton Stallrystal Seville Brick, NP   Please contact Palliative Medicine Team phone at [phone] for questions and concerns.  For individual provider: See Loretha StaplerAmion

## 2017-12-11 NOTE — Progress Notes (Signed)
   Went to see patient but door closed with do not disturb sign. Opened it and saw many family members around him crying. Close door an dleft  PCCM will sign off  Noticed plan for hospice -appropriate   Dr. Kalman ShanMurali Sativa Gelles, M.D., [facility].C.P Pulmonary and Critical Care Medicine Staff Physician, [facility] Director - Interstitial Lung Disease  Program  Pulmonary Fibrosis [facility] [place], [place], [id number]  Pager: [phone], If no answer or between  15:00h - 7:00h: call [phone] Telephone: [phone]

## 2017-12-11 DEATH — deceased

## 2019-05-21 IMAGING — CT CT ANGIO CHEST
2 of 6 series · 18 of 36 positions shown · IV contrast (Omni 300)
Comparison: Chest radiograph dated 08/17/2017

CLINICAL DATA: 67-year-old male with shortness of breath. Positive
D-dimer. Concern for pulmonary embolism.

EXAM:
CT ANGIOGRAPHY CHEST WITH CONTRAST
TECHNIQUE: Multidetector CT imaging of the chest was performed using the
standard protocol during bolus administration of intravenous
contrast. Multiplanar CT image reconstructions and MIPs were
obtained to evaluate the vascular anatomy.
CONTRAST:  100mL [id number] IOPAMIDOL ([id number]) INJECTION 76%

[Series 7: pe thins · axial · 0.88mm/px · z∈[+1155,+1369]mm · 17 of 242 slices shown]
[im 14/242  lung]
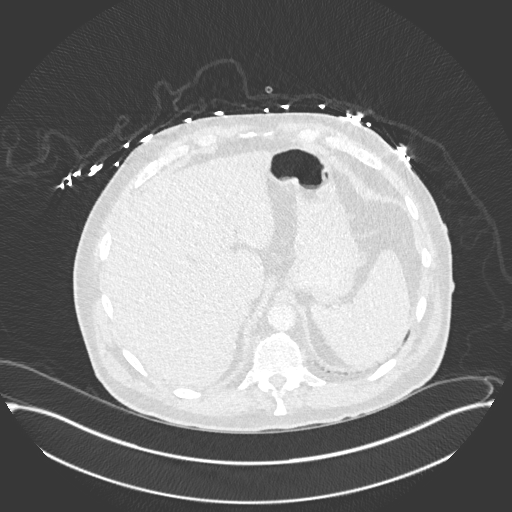
[im 27/242  mediastinal]
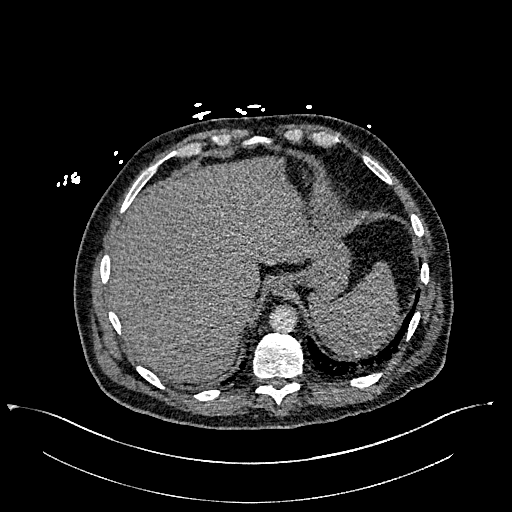
[im 41/242  lung]
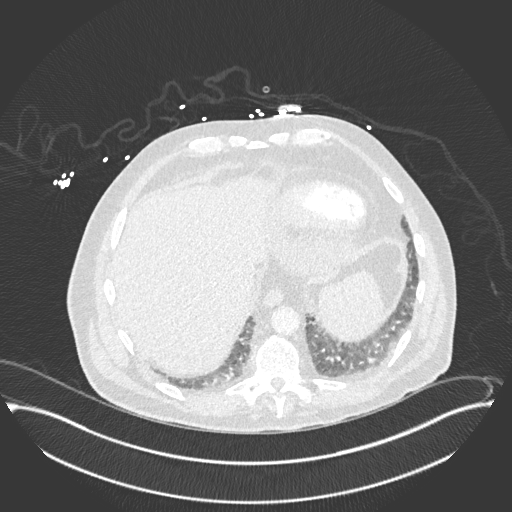
[im 54/242  mediastinal]
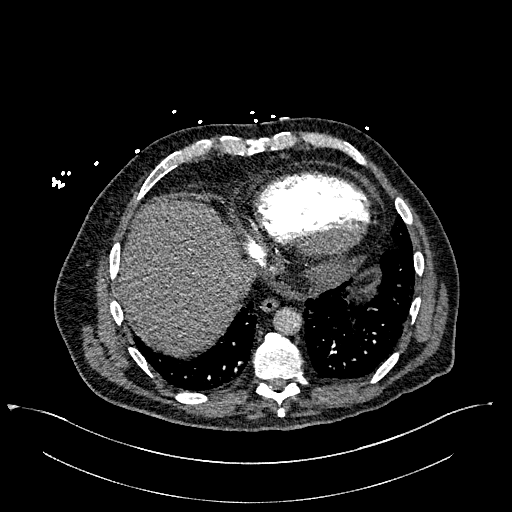
[im 67/242  lung]
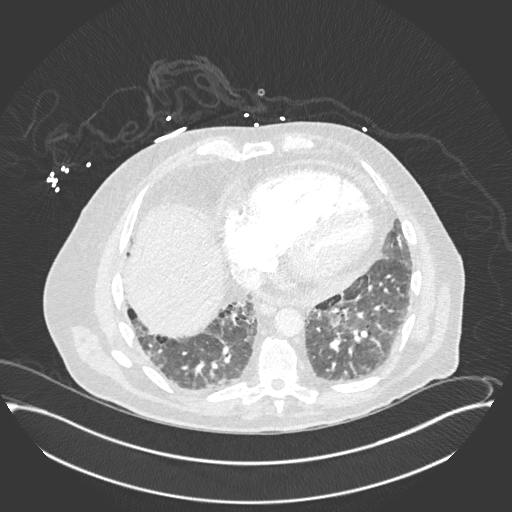
[im 81/242  mediastinal]
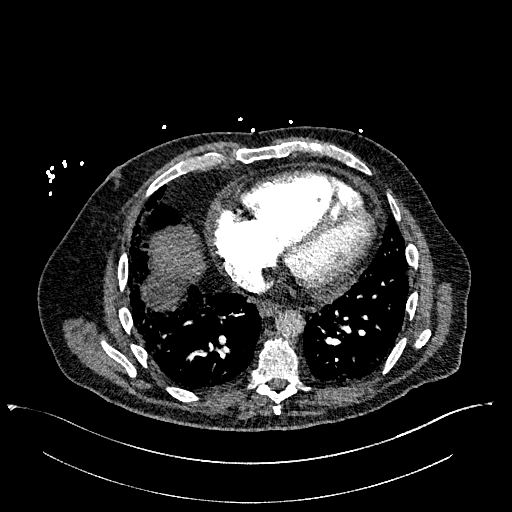
[im 94/242  lung]
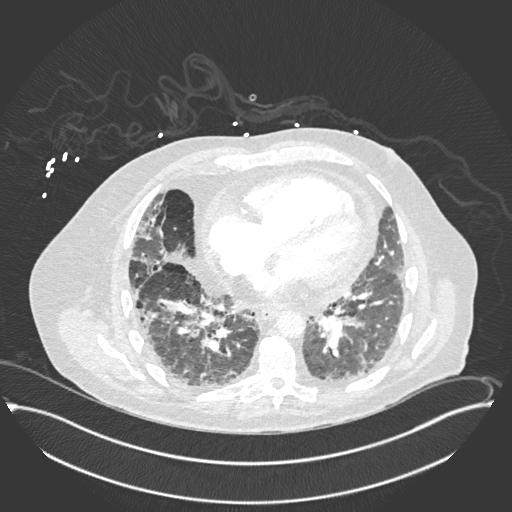
[im 108/242  mediastinal]
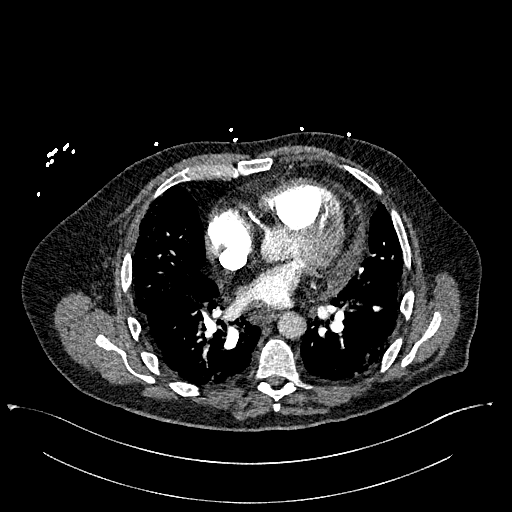
[im 121/242  lung]
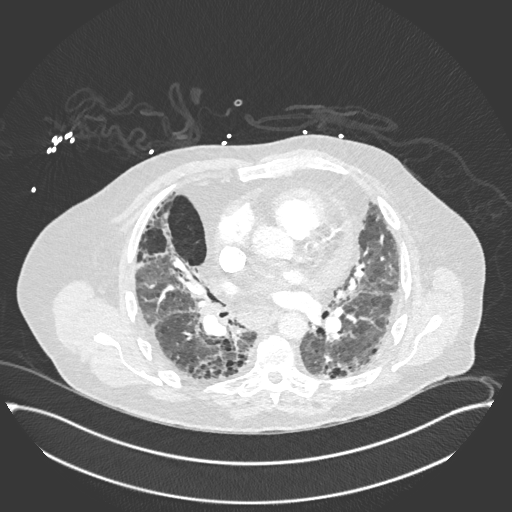
[im 134/242  mediastinal]
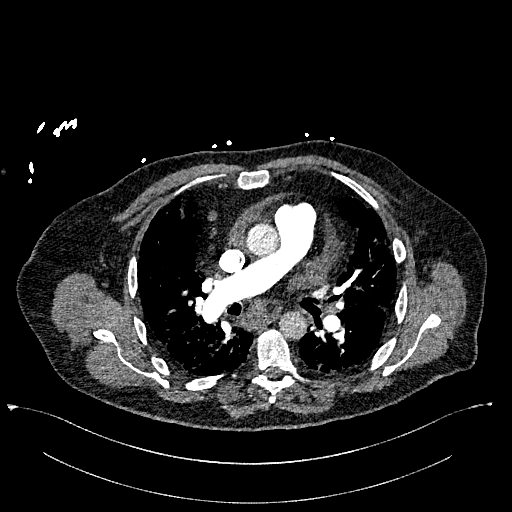
[im 148/242  lung]
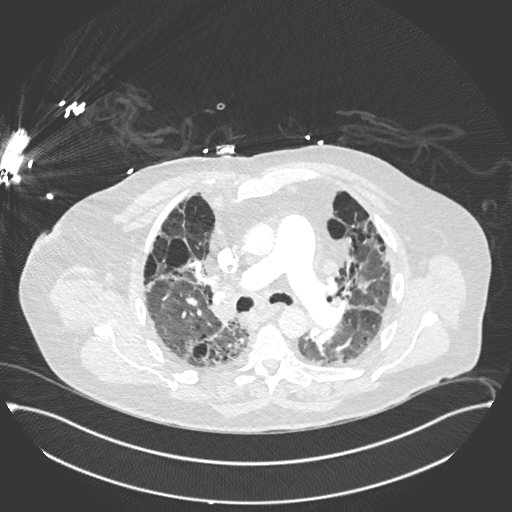
[im 161/242  mediastinal]
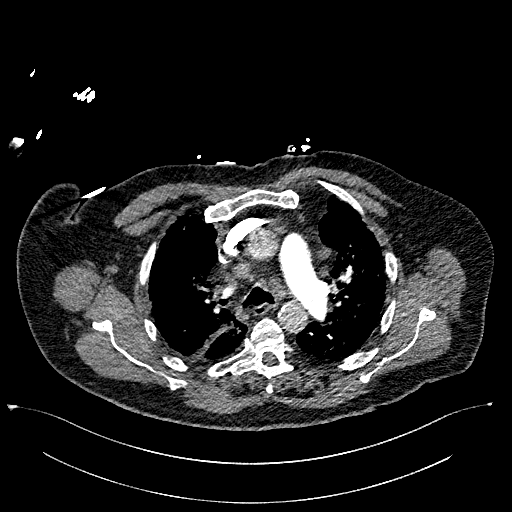
[im 175/242  lung]
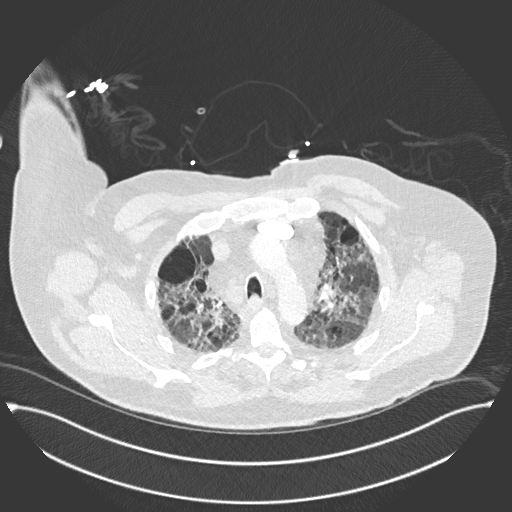
[im 188/242  mediastinal]
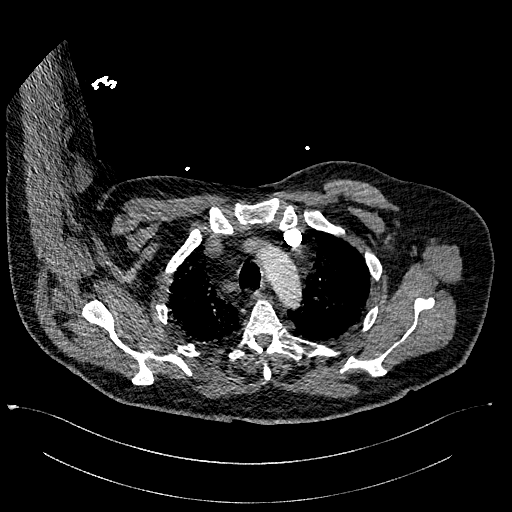
[im 201/242  lung]
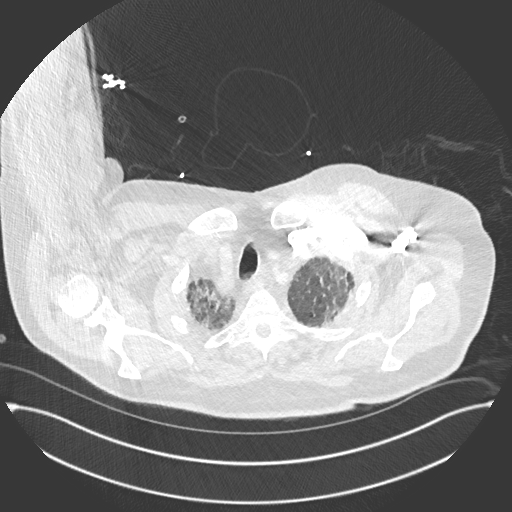
[im 215/242  mediastinal]
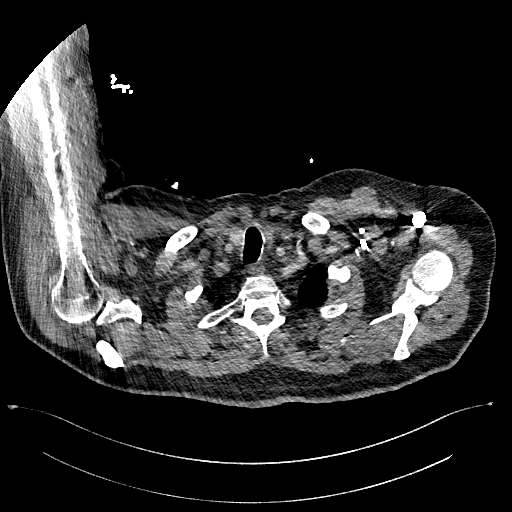
[im 228/242  lung]
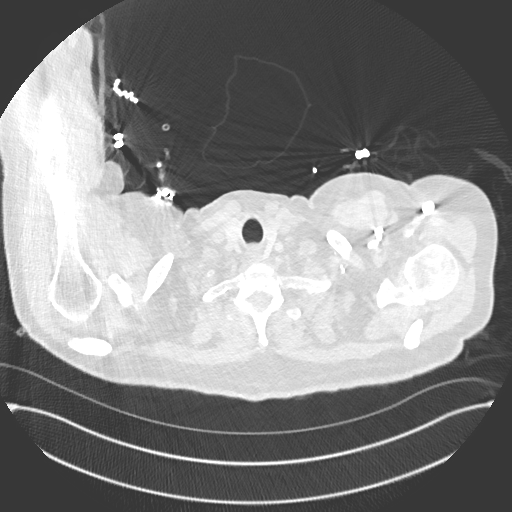

[Series 8: pe 2mm cor · coronal · 0.49mm/px · 1 of 128 slices shown]
[im 64/128  mediastinal]
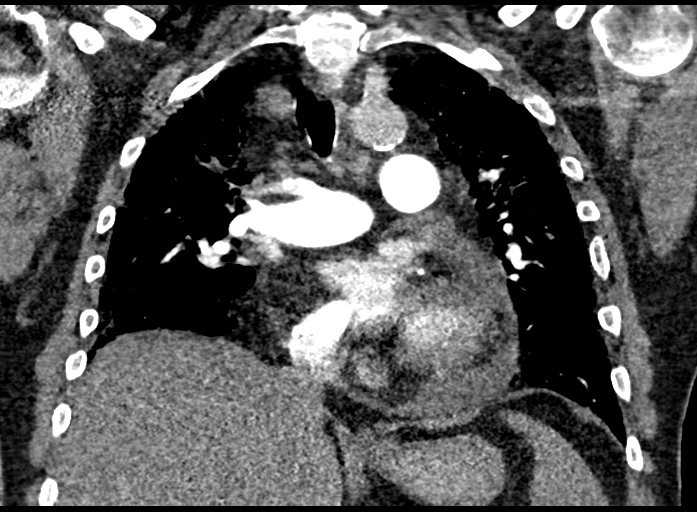

[18 of 36 positions shown; findings below may reference images not displayed]

FINDINGS: Cardiovascular: Mild cardiomegaly. Small pericardial effusion
measuring up to 1 cm in thickness. Correlation with echocardiogram
recommended. There is mild atherosclerotic calcification of the
thoracic aorta. No aneurysmal dilatation. Evaluation of the aorta is
limited due to suboptimal opacification and timing of the contrast.
There is no CT evidence of pulmonary embolism.

Mediastinum/Nodes: Mildly enlarged mediastinal lymph nodes as seen
on the prior CT, of indeterminate clinical significance or etiology.
These measure 17 mm in short axis in the AP window and 16 mm in the
right paratracheal region. A mildly enlarged lymph node or
confluence of lymph node in the subcarinal region appears similar to
prior CT and measures approximately 22 mm in short axis. Top-normal
right hilar lymph nodes also noted. The esophagus and the thyroid
gland are grossly unremarkable. No mediastinal fluid collection.

Lungs/Pleura: Diffuse interstitial coarsening and fibrosis as seen
previously. There is no consolidative changes. No pleural effusion
or pneumothorax. The central airways are patent.

Upper Abdomen: No acute abnormality.

Musculoskeletal: No chest wall abnormality. No acute or significant
osseous findings.

Review of the MIP images confirms the above findings.
IMPRESSION: 1. No acute intrathoracic pathology. No CT evidence of pulmonary
embolism.
2. Findings of interstitial fibrosis.  No consolidative changes.
3. Mildly enlarged mediastinal and hilar lymph nodes similar to
prior CT and of indeterminate clinical significance or etiology,
likely reactive. Clinical correlation is recommended.
4. Mild cardiomegaly and small pericardial effusion. The pericardial
effusion is new compared to the prior CT. Correlation with clinical
exam and echocardiogram recommended.
